# Patient Record
Sex: Female | Born: 1945 | Race: White | Hispanic: No | Marital: Married | Smoking: Former smoker
Health system: Southern US, Community
[De-identification: ages and names within clinical notes are randomized; demographics above are authoritative.]

## PROBLEM LIST (undated history)

## (undated) DIAGNOSIS — E785 Hyperlipidemia, unspecified: Secondary | ICD-10-CM

## (undated) DIAGNOSIS — R413 Other amnesia: Secondary | ICD-10-CM

## (undated) DIAGNOSIS — I10 Essential (primary) hypertension: Secondary | ICD-10-CM

## (undated) DIAGNOSIS — C50919 Malignant neoplasm of unspecified site of unspecified female breast: Secondary | ICD-10-CM

## (undated) DIAGNOSIS — G479 Sleep disorder, unspecified: Secondary | ICD-10-CM

## (undated) DIAGNOSIS — I5032 Chronic diastolic (congestive) heart failure: Secondary | ICD-10-CM

## (undated) HISTORY — DX: Essential (primary) hypertension: I10

## (undated) HISTORY — DX: Other amnesia: R41.3

## (undated) HISTORY — DX: Malignant neoplasm of unspecified site of unspecified female breast: C50.919

---

## 1980-06-06 HISTORY — PX: HIP SURGERY: SHX245

## 1999-12-23 ENCOUNTER — Other Ambulatory Visit: Admission: RE | Admit: 1999-12-23 | Discharge: 1999-12-23 | Payer: Self-pay | Admitting: Obstetrics and Gynecology

## 2001-02-14 ENCOUNTER — Other Ambulatory Visit: Admission: RE | Admit: 2001-02-14 | Discharge: 2001-02-14 | Payer: Self-pay | Admitting: Obstetrics and Gynecology

## 2001-09-13 ENCOUNTER — Other Ambulatory Visit: Admission: RE | Admit: 2001-09-13 | Discharge: 2001-09-13 | Payer: Self-pay | Admitting: Obstetrics and Gynecology

## 2002-03-12 ENCOUNTER — Other Ambulatory Visit: Admission: RE | Admit: 2002-03-12 | Discharge: 2002-03-12 | Payer: Self-pay | Admitting: Obstetrics and Gynecology

## 2002-11-12 ENCOUNTER — Other Ambulatory Visit: Admission: RE | Admit: 2002-11-12 | Discharge: 2002-11-12 | Payer: Self-pay | Admitting: Obstetrics and Gynecology

## 2003-03-14 ENCOUNTER — Other Ambulatory Visit: Admission: RE | Admit: 2003-03-14 | Discharge: 2003-03-14 | Payer: Self-pay | Admitting: Obstetrics and Gynecology

## 2004-07-19 ENCOUNTER — Other Ambulatory Visit: Admission: RE | Admit: 2004-07-19 | Discharge: 2004-07-19 | Payer: Self-pay | Admitting: Obstetrics and Gynecology

## 2006-01-09 ENCOUNTER — Other Ambulatory Visit: Admission: RE | Admit: 2006-01-09 | Discharge: 2006-01-09 | Payer: Self-pay | Admitting: Obstetrics and Gynecology

## 2011-04-07 DIAGNOSIS — C50919 Malignant neoplasm of unspecified site of unspecified female breast: Secondary | ICD-10-CM

## 2011-04-07 HISTORY — DX: Malignant neoplasm of unspecified site of unspecified female breast: C50.919

## 2011-05-03 ENCOUNTER — Other Ambulatory Visit: Payer: Self-pay

## 2011-05-04 ENCOUNTER — Other Ambulatory Visit: Payer: Self-pay | Admitting: Radiology

## 2011-05-04 DIAGNOSIS — C50912 Malignant neoplasm of unspecified site of left female breast: Secondary | ICD-10-CM

## 2011-05-09 ENCOUNTER — Telehealth: Payer: Self-pay | Admitting: *Deleted

## 2011-05-09 NOTE — Telephone Encounter (Signed)
Spoke with pt concerning [facility] for 05/11/11.  Pt relay she confirmed appt with Baltazar Apo.  Pt denies questions or concerns at this time.  Encourage pt to call with needs.  Contact information given.

## 2011-05-10 ENCOUNTER — Ambulatory Visit
Admission: RE | Admit: 2011-05-10 | Discharge: 2011-05-10 | Disposition: A | Discharge status: Home / Self Care | Payer: 59 | Source: Ambulatory Visit | Attending: Radiology | Admitting: Radiology

## 2011-05-10 DIAGNOSIS — C50912 Malignant neoplasm of unspecified site of left female breast: Secondary | ICD-10-CM

## 2011-05-10 MED ORDER — GADOBENATE DIMEGLUMINE 529 MG/ML IV SOLN
20.0000 mL | Freq: Once | INTRAVENOUS | Status: AC | PRN
Start: 2011-05-10 — End: 2011-05-10
  Administered 2011-05-10: 20 mL via INTRAVENOUS

## 2011-05-11 ENCOUNTER — Ambulatory Visit (HOSPITAL_BASED_OUTPATIENT_CLINIC_OR_DEPARTMENT_OTHER): Payer: 59 | Admitting: Oncology

## 2011-05-11 ENCOUNTER — Other Ambulatory Visit: Payer: 59 | Admitting: Lab

## 2011-05-11 ENCOUNTER — Encounter: Payer: Self-pay | Admitting: Oncology

## 2011-05-11 ENCOUNTER — Ambulatory Visit: Payer: 59 | Admitting: Physical Therapy

## 2011-05-11 ENCOUNTER — Ambulatory Visit (HOSPITAL_BASED_OUTPATIENT_CLINIC_OR_DEPARTMENT_OTHER): Payer: 59 | Admitting: Surgery

## 2011-05-11 ENCOUNTER — Ambulatory Visit: Payer: 59

## 2011-05-11 ENCOUNTER — Ambulatory Visit
Admission: RE | Admit: 2011-05-11 | Discharge: 2011-05-11 | Disposition: A | Discharge status: Home / Self Care | Payer: 59 | Source: Ambulatory Visit | Attending: Radiation Oncology | Admitting: Radiation Oncology

## 2011-05-11 ENCOUNTER — Other Ambulatory Visit (INDEPENDENT_AMBULATORY_CARE_PROVIDER_SITE_OTHER): Payer: Self-pay | Admitting: Oncology

## 2011-05-11 ENCOUNTER — Telehealth: Payer: Self-pay | Admitting: *Deleted

## 2011-05-11 VITALS — BP 143/86 | HR 86 | Temp 98.3°F | Ht 65.0 in | Wt 236.0 lb

## 2011-05-11 VITALS — BP 143/86 | HR 86 | Temp 98.3°F | Ht 65.5 in | Wt 236.0 lb

## 2011-05-11 DIAGNOSIS — Z170 Estrogen receptor positive status [ER+]: Secondary | ICD-10-CM

## 2011-05-11 DIAGNOSIS — D0510 Intraductal carcinoma in situ of unspecified breast: Secondary | ICD-10-CM

## 2011-05-11 DIAGNOSIS — Z809 Family history of malignant neoplasm, unspecified: Secondary | ICD-10-CM | POA: Insufficient documentation

## 2011-05-11 DIAGNOSIS — D0512 Intraductal carcinoma in situ of left breast: Secondary | ICD-10-CM | POA: Insufficient documentation

## 2011-05-11 DIAGNOSIS — Z510 Encounter for antineoplastic radiation therapy: Secondary | ICD-10-CM | POA: Insufficient documentation

## 2011-05-11 DIAGNOSIS — Z79899 Other long term (current) drug therapy: Secondary | ICD-10-CM | POA: Insufficient documentation

## 2011-05-11 DIAGNOSIS — D0590 Unspecified type of carcinoma in situ of unspecified breast: Secondary | ICD-10-CM

## 2011-05-11 DIAGNOSIS — C50919 Malignant neoplasm of unspecified site of unspecified female breast: Secondary | ICD-10-CM

## 2011-05-11 DIAGNOSIS — I10 Essential (primary) hypertension: Secondary | ICD-10-CM | POA: Insufficient documentation

## 2011-05-11 HISTORY — DX: Essential (primary) hypertension: I10

## 2011-05-11 NOTE — Progress Notes (Addendum)
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 female who is seen today at the [facility] for evaluation of her DCIS of the left breast. She had a screening mammogram on 04/25/2011 at [facility]. On the left there appear to be a new subcentimeter cluster of microcalcifications. Additional views on 04/27/2011 showed pleomorphic calcifications in the upper outer quadrant of the left breast. Biopsy on 05/03/2011 was diagnostic for high-grade DCIS with comedonecrosis. The DCIS was strongly ER/PR positive. Breast MRI on 05/10/2011 showed an area of masslike enhancement measuring 1.1 x 1.0 x 0.9 cm within the upper-outer quadrant of the left breast, anteriorly. She is without complaints today to her    PREVIOUS RADIATION THERAPY: No   PAST MEDICAL HISTORY:  has no past medical history on file.     PAST SURGICAL HISTORY: Past Surgical History  Procedure Date  . Hip surgery 1982     FAMILY HISTORY: family history includes Cancer in her mother.   SOCIAL HISTORY:  reports that she quit smoking about 18 months ago. She does not have any smokeless tobacco history on file. She reports that she does not drink alcohol.   ALLERGIES: Review of patient's allergies indicates no known allergies.   MEDICATIONS:  Current Outpatient Prescriptions  Medication Sig Dispense Refill  . acetaminophen (TYLENOL) 500 MG tablet Take 500 mg by mouth every 6 (six) hours as needed.        . cholecalciferol (VITAMIN D) 1000 UNITS tablet Take 1,000 Units by mouth daily.        . diflorasone-emollient (APEXICON E) 0.05 % CREA Apply topically 2 (two) times  daily.        Marland Kitchen losartan (COZAAR) 100 MG tablet Take 100 mg by mouth daily.            REVIEW OF SYSTEMS:  Pertinent items are noted in HPI.    PHYSICAL EXAM: Alert and oriented. Vital signs blood pressure 143/86 pulse 86 and regular respiratory rate 20 temperature afebrile. Head and neck examination: Grossly unremarkable. Nodes: Without palpable cervical, supraclavicular, or axillary lymphadenopathy. Chest: Lungs clear. Back: Without spinal or CVA discomfort. Heart: Regular rate and rhythm. Breasts: There is a punctate biopsy wound at approximately 2:00. No masses are appreciated. Right breast without masses or lesions. Abdomen without hepatomegaly. Extremities without edema. Neurologic examination grossly nonfocal.  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 I explained to the patient and her husband the natural history of DCIS. Her local management options include mastectomy or partial cystectomy followed by radiation therapy. She desires breast preservation. We discussed the potential acute and late toxicities of radiation therapy. She may be a candidate for the B. 43 study depending on her HER-2/neu status. We'll also discussed repeating her left breast mammogram following conservative surgery to confirm removal of all suspicious microcalcifications. Lastly, we also discussed  deep inspiration and breath-hold tangent radiation therapy for her left-sided breast cancer to avoid cardiac irradiation. Her prognosis is excellent.   PLAN: A followup visit will be scheduled with me once we have her date of surgery.  I spent 40 minutes minutes face to face with the patient and more than 50% of that time was spent in counseling and/or coordination of care.

## 2011-05-11 NOTE — Telephone Encounter (Signed)
gave patient appointment for 06-20-2011 printed out calendar and gave to the patient

## 2011-05-11 NOTE — Progress Notes (Signed)
Encounter addended by: Maryln Gottron, MD on: 05/11/2011 11:40 AM<BR>     Documentation filed: Notes Section

## 2011-05-11 NOTE — Progress Notes (Addendum)
Re:   Cydnee Fuquay DOB:   1945/11/06 MRN:   [id number]  [facility]  ASSESSMENT AND PLAN: 1.  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 Welton Flakes and [facility] treating oncologist.  I discussed the options for breast cancer treatment with the patient.  The patient is in the [facility] and understands the idea of a multidisciplinary approach to the treatment of breast cancer, which often includes medical oncology and radiation oncology.  I discussed the options of lumpectomy vs. mastectomy.  I discussed the options of lymph node biopsy.  The treatment plan depends on the pathologic staging of the tumor and the patient's personal wishes.  The risks of surgery include, but are not limited to, bleeding, infection, the need for further surgery, and nerve injury.  The patient is a good candidate for lumpectomy.  Because of the small size of her tumor, she does not need a SLNBx.  She will need a needle loc.  This will be followed by radiation tx and probable anti-estrogen tx.  Possible B43 candidate.  [Shylie Wisser 04-11-2046 had genetics on 12/7 and surgery on 12/10. She is scheduled for a f/u with Dr. Dayton Scrape on 1/3 and Dr. Welton Flakes on 1/14.]  2.  Morbid obesity. BMI - 39.3. 3.  Hypertension.  REFERRING PHYSICIAN:  Shary Decamp, MD  HISTORY OF PRESENT ILLNESS: Maria Bolton is a 65 y.o. (DOB: 07-Apr-1946)  white female whose primary care physician is Shary Decamp, MD  and comes to me today for left breast cancer.  She felt nothing in her breat.  She just went for her annual mammogram.  No history of hormonal use.  LMP about 2002.  Her mother had breast cancer at age 38.  MRI on 05/10/2011 - 1.1 x 1.0 cm mass in UOQ left breast.   No past medical history on file.    Past Surgical History  Procedure Date  . Hip surgery 1982      Current Outpatient Prescriptions  Medication Sig Dispense Refill  . acetaminophen (TYLENOL) 500 MG tablet Take 500 mg by mouth every 6 (six) hours as  needed.        . cholecalciferol (VITAMIN D) 1000 UNITS tablet Take 1,000 Units by mouth daily.        . diflorasone-emollient (APEXICON E) 0.05 % CREA Apply topically 2 (two) times daily.        Marland Kitchen losartan (COZAAR) 100 MG tablet Take 100 mg by mouth daily.           No Known Allergies  REVIEW OF SYSTEMS: Skin:  No history of rash.  No history of abnormal moles. Infection:  No history of hepatitis or HIV.  No history of MRSA. Neurologic:  No history of stroke.  No history of seizure.  No history of headaches. Cardiac:  Hypertension x 10 years. No history of heart disease. Pulmonary:  Does not smoke cigarettes.  No asthma or bronchitis.  No OSA/CPAP.  Endocrine:  No diabetes. No thyroid disease. Gastrointestinal:  No history of stomach disease.  No history of liver disease.  No history of gall bladder disease.  No history of pancreas disease.  No history of colon disease. Urologic:  No history of kidney stones.  No history of bladder infections. Musculoskeletal:  Had hip surgery about 1983 for inflammation. GYN:  Sees Dr. Gae Gallop, but has not seen her in 2 or 3 years. Hematologic:  No bleeding disorder.  No history  of anemia.  Not anticoagulated. Psycho-social:  The patient is oriented.   The patient has no obvious psychologic or social impairment to understanding our conversation and plan.  SOCIAL and FAMILY HISTORY: Accompanied with husband. She has one son, 36, who lives in [place].  PHYSICAL EXAM: BP 143/86  Pulse 86  Temp 98.3 F (36.8 C)  Ht 5\' 5"  (1.651 m)  Wt 236 lb (107.049 kg)  BMI 39.27 kg/m2  General: Obese WN WF, who is alert and generally healthy appearing.  HEENT: Normal. Pupils equal. Good dentition. Neck: Supple. No mass.  No thyroid mass.  Carotid pulse okay with no bruit. Lymph Nodes:  No supraclavicular, cervical nodes, or axillary nodes. Lungs: Clear to auscultation and symmetric breath sounds. Heart:  RRR. No murmur or rub. Breasts:  Biopsy site at  3 o'clock left breast.  No palpable mass on left. She has a mole at 9 o'clock on left breast that she wants removed.   Right breast is unremarkable.  Abdomen: Soft. No mass. No tenderness. No hernia. Normal bowel sounds.  No abdominal scars. Rectal: Not done. Extremities:  Good strength and ROM  in upper and lower extremities. Neurologic:  Grossly intact to motor and sensory function. Psychiatric: Has normal mood and affect. Behavior is normal.   DATA REVIEWED: Mammogram, MRI, and path.  Path report given to patient.  Ovidio Kin, MD,  [facility] [address]  [address]   [place], [place]    [address] Phone:  [phone] FAX:  [phone]

## 2011-05-11 NOTE — Progress Notes (Signed)
Maria Bolton [id number] [birth year] 65 y.o. 05/11/2011 1:03 PM  CC:  Dr. Thea Silversmith at [facility]  Dr. Londell Moh at [facility] dermatology  Dr. Ovidio Kin at [facility] surgery  Dr. Chipper Herb radiation oncology REASON FOR CONSULTATION:  65 year old female with new diagnosis of ductal carcinoma in situ of the left breast diagnosed on 05/03/2011 after he needle core biopsy. Patient was seen in the Multidisciplinary Breast Clinic for discussion of her treatment options. She was seen by Dr. Drue Second, [facility]  STAGE:  Tis Nx Mx   REFERRING PHYSICIAN: Dr. Alanson Aly  HISTORY OF PRESENT ILLNESS:  Maria Bolton is a 65 y.o. female.  Who is seen in the multidisciplinary breast clinic today for diagnosis of high-grade ductal carcinoma in situ with comedonecrosis and calcifications. Patient began having screening mammogram on your yearly basis at the age of 83. This year she presented for a annual screening mammograms that showed calcification abnormalities in the left breast. She then went on to have an ultrasound performed that confirmed the abnormality. Patient on 05/03/2011 had a biopsy performed. The biopsy revealed high-grade ductal carcinoma in situ with comedonecrosis and calcification. The tumor was estrogen receptor +99% progesterone receptor +100%. She is seen today in the multidisciplinary breast clinic for discussion of her treatment options. She is without any complaints.   Past Medical History: Past Medical History  Diagnosis Date  . Hypertension 05/11/2011   Past Surgical History: Past Surgical History  Procedure Date  . Hip surgery 1982    Family History: Patient's mother had breast cancer at the age of 54. Patient had 2 paternal aunts one with breast cancer who died at a young age. The other was a breast cancer survivor who is now 63 years old. Patient also has a maternal cousin who had breast cancer  between the age of 37 and 52. Family History  Problem Relation Age of Onset  . Cancer Mother     Social History History  Substance Use Topics  . Smoking status: Former Smoker -- 1.0 packs/day    Quit date: 11/04/2009  . Smokeless tobacco: Not on file  . Alcohol Use: No    Allergies: No Known Allergies  Current Medications: Current Outpatient Prescriptions  Medication Sig Dispense Refill  . acetaminophen (TYLENOL) 500 MG tablet Take 500 mg by mouth every 6 (six) hours as needed.        . cholecalciferol (VITAMIN D) 1000 UNITS tablet Take 1,000 Units by mouth daily.        . diflorasone-emollient (APEXICON E) 0.05 % CREA Apply topically 2 (two) times daily.        Marland Kitchen losartan (COZAAR) 100 MG tablet Take 100 mg by mouth daily.          OB/GYN History: Patient began having menstrual periods at the age of 51. She underwent menopause about 10 years ago when she was 65 years old. She has never been on hormone replacement therapy. She had 1 term pregnancy at the age of 69.  Fertility Discussion: Patient is postmenopausal Prior History of Cancer: No prior history of cancers.  Health Maintenance:  Colonoscopy patient's last colonoscopy was 4 years ago Bone Density she had her last bone density about 2 years ago Last PAP smear last Pap smear was 2 years ago  ECOG PERFORMANCE STATUS: 1 - Symptomatic but completely ambulatory  Genetic Counseling/testing: Due to patient's family history we did discuss genetic counseling and testing. I explained the counseling part of  the patient and the rationale to the patient. She is agreeable to be seen by one of our genetic counselors for further discussion and pedigree generation. If it is deemed that patient is at high risk then she will undergo genetic testing.  REVIEW OF SYSTEMS:  Constitutional: negative Eyes: negative Ears, nose, mouth, throat, and face: negative Respiratory: negative Cardiovascular: negative Gastrointestinal:  negative Integument/breast: positive for breast tenderness Hematologic/lymphatic: negative Musculoskeletal:negative Neurological: negative  PHYSICAL EXAMINATION: Blood pressure 143/86, pulse 86, temperature 98.3 F (36.8 C), temperature source Oral, height 5' 5.5" (1.664 m), weight 236 lb (107.049 kg).  ZOX:WRUEA, healthy, no distress, well nourished, well developed and anxious SKIN: skin color, texture, turgor are normal HEAD: Normocephalic, No masses, lesions, tenderness or abnormalities EYES: normal, PERRLA, EOMI, Conjunctiva are pink and non-injected, sclera clear EARS: External ears normal OROPHARYNX:no exudate, no erythema, lips, buccal mucosa, and tongue normal and dentition normal  NECK: supple, no adenopathy, no bruits, no JVD, thyroid normal size, non-tender, without nodularity LYMPH:  no palpable lymphadenopathy BREAST:breasts appear normal, no suspicious masses, no skin or nipple changes or axillary nodes, right breast normal without mass, skin or nipple changes or axillary nodes, left breast does reveal site of the needle core biopsy with some area of ecchymosis otherwise no masses or nipple discharge. LUNGS: clear to auscultation , and palpation, clear to auscultation and percussion HEART: regular rate & rhythm, no murmurs, no gallops, S1 normal and S2 normal ABDOMEN:abdomen soft, non-tender, obese, normal bowel sounds and no masses or organomegaly BACK: Back symmetric, no curvature., No CVA tenderness, Range of motion is normal EXTREMITIES:no joint deformities, effusion, or inflammation, no edema, no clubbing, no cyanosis  NEURO: alert & oriented x 3 with fluent speech, no focal motor/sensory deficits, gait normal, reflexes normal and symmetric  BREAST EXAM: Bilateral breasts are examined left breast reveals area of ecchymosis from her recent biopsy. right breast no masses or nipple discharge or retractions.   STUDIES/RESULTS: Mr Breast Bilateral W Wo  Contrast  05/10/2011  *RADIOLOGY REPORT*  Clinical Data: Recently diagnosed right breast high grade ductal carcinoma in situ.  BUN and creatinine were obtained on site at [facility] at [address] Results:  BUN 11 mg/dL,  Creatinine 0.8 mg/dL.  BILATERAL BREAST MRI WITH AND WITHOUT CONTRAST  Technique: Multiplanar, multisequence MR images of both breasts were obtained prior to and following the intravenous administration of 20ml of MultiHance.  Three dimensional images were evaluated at the independent DynaCad workstation.  Comparison:  Recent mammogram and biopsy examinations at [facility].  Findings: Mild to moderate background parenchymal enhancement in both breasts.  Biopsy marker clip artifact in the anterior aspect of the outer left breast, slightly superiorly.  There is a small surrounding area of mass-like enhancement with predominately persistent plateau kinetics.  This area measures 1.1 x 1.0 x 0.9 cm in maximum dimensions.  No additional masses or areas of enhancement suspicious for malignancy in either breast.  No abnormal appearing lymph nodes.  IMPRESSION:  1.  1.1 x 1.0 x 0.9 cm area of biopsy-proven ductal carcinoma in situ in the upper outer quadrant of the left breast anteriorly. 2.  No evidence of malignancy elsewhere in either breast.  THREE-DIMENSIONAL MR IMAGE RENDERING ON INDEPENDENT WORKSTATION:  Three-dimensional MR images were rendered by post-processing of the original MR data on an independent workstation.  The three- dimensional MR images were interpreted, and findings were reported in the accompanying complete MRI report for this study.  BI-RADS CATEGORY 6:  Known biopsy-proven malignancy - appropriate action should be taken.  Recommendation:  Treatment plan.  Original Report Authenticated By: Darrol Angel, M.D.     LABS:  None available       PATHOLOGY: Core Needle Biopsy:   Surgical Pathology: Size: calcifications 1.1cm    Size:      Lymph  Node: none     Lymph Node:Grade: High grade     Grade:Proliferation Marker Ki-67: n/a   Ki-67: ER receptor: 99%     ER: PR receptor: 100%     PR: Her2Neu: n/a     Her2Neu:  ASSESSMENT    65 year old female with high-grade ductal carcinoma in situ of the left breast diagnosed 05/03/2011 with associated comedo necrosis and calcifications. By MRI the calcifications measuring 1.1 x 1.0 x 0.9 cm. Patient is status post needle biopsy on 05/03/2011 that showed a DCIS high-grade without any evidence of any other disease. Patient is seen in the multidisciplinary breast clinic for discussion of treatment options. She was seen by Dr. Ovidio Kin Dr. Chipper Herb and myself today. Recommendations have been made for a lumpectomy with sentinel node biopsy. Once she has that she will undergo radiation therapy to the left breast. Post radiation she will be considered for adjuvant antiestrogen therapy consisting of either tamoxifen or an aromatase inhibitor such as Aromasin.  Clinical Trial Eligibility: Week 2 day discussed NSABP B. 43 clinical trial which she would be eligible for. Multidisciplinary conference discussion patient's case was also discussed at the multidisciplinary breast conference this morning. Recommendations were made for a lumpectomy followed by radiation therapy and anti estrogen therapy.    PLAN:    #1 lumpectomy with sentinel node biopsy to be performed by Dr. Ezzard Standing. Dr. Allene Pyo office will contact the patient regarding scheduling of the surgery.  #2 radiation therapy post lumpectomy  #3 patient is eligible for NSABP E. 43 clinical trial and this was discussed with her. Once patient has her final pathology she will meet with our research nurses regarding this trial.  #4 patient will receive antiestrogen therapy consisting of either tamoxifen or an aromatase inhibitor such as Aromasin.       Discussion: Patient is being treated per NCCN breast cancer care guidelines appropriate  for stage. Patient's clinical stage is 0.   Thank you so much for allowing me to participate in the care of [facility]. I will continue to follow up the patient with you and assist in her care.  All questions were answered. The patient knows to call the clinic with any problems, questions or concerns. We can certainly see the patient much sooner if necessary.  I spent 40 minutes counseling the patient face to face. The total time spent in the appointment was 60 minutes.  Drue Second, MD Medical/Oncology [facility] [phone] (beeper) [phone] (Office)  05/11/2011, 1:03 PM 05/11/2011, 1:03 PM

## 2011-05-12 ENCOUNTER — Encounter (HOSPITAL_BASED_OUTPATIENT_CLINIC_OR_DEPARTMENT_OTHER): Payer: Self-pay | Admitting: *Deleted

## 2011-05-12 ENCOUNTER — Other Ambulatory Visit (INDEPENDENT_AMBULATORY_CARE_PROVIDER_SITE_OTHER): Payer: Self-pay | Admitting: Surgery

## 2011-05-12 NOTE — Progress Notes (Signed)
To come in for preop tests-waiting on [facility] orders

## 2011-05-13 ENCOUNTER — Other Ambulatory Visit: Payer: Self-pay

## 2011-05-13 ENCOUNTER — Ambulatory Visit: Payer: 59

## 2011-05-13 ENCOUNTER — Encounter (HOSPITAL_BASED_OUTPATIENT_CLINIC_OR_DEPARTMENT_OTHER)
Admission: RE | Admit: 2011-05-13 | Discharge: 2011-05-13 | Disposition: A | Discharge status: Home / Self Care | Payer: 59 | Source: Ambulatory Visit | Attending: Surgery | Admitting: Surgery

## 2011-05-13 LAB — BASIC METABOLIC PANEL
BUN: 15 mg/dL (ref 6–23)
CO2: 26 mEq/L (ref 19–32)
Chloride: 104 mEq/L (ref 96–112)
GFR calc Af Amer: >90 mL/min (ref >90)
Potassium: 4.5 mEq/L (ref 3.5–5.1)

## 2011-05-13 NOTE — Progress Notes (Signed)
Pt. Seen for genetic counseling.  Blood drawn for BRCA 1/2 

## 2011-05-16 ENCOUNTER — Encounter (HOSPITAL_BASED_OUTPATIENT_CLINIC_OR_DEPARTMENT_OTHER)
Admission: RE | Disposition: A | Discharge status: Home / Self Care | Payer: Self-pay | Source: Ambulatory Visit | Attending: Surgery

## 2011-05-16 ENCOUNTER — Encounter (HOSPITAL_BASED_OUTPATIENT_CLINIC_OR_DEPARTMENT_OTHER): Payer: Self-pay | Admitting: Anesthesiology

## 2011-05-16 ENCOUNTER — Other Ambulatory Visit (INDEPENDENT_AMBULATORY_CARE_PROVIDER_SITE_OTHER): Payer: Self-pay | Admitting: Surgery

## 2011-05-16 ENCOUNTER — Ambulatory Visit (HOSPITAL_BASED_OUTPATIENT_CLINIC_OR_DEPARTMENT_OTHER)
Admission: RE | Admit: 2011-05-16 | Discharge: 2011-05-16 | Disposition: A | Discharge status: Home / Self Care | Payer: 59 | Source: Ambulatory Visit | Attending: Surgery | Admitting: Surgery

## 2011-05-16 ENCOUNTER — Encounter (HOSPITAL_BASED_OUTPATIENT_CLINIC_OR_DEPARTMENT_OTHER): Payer: Self-pay | Admitting: *Deleted

## 2011-05-16 DIAGNOSIS — C50919 Malignant neoplasm of unspecified site of unspecified female breast: Secondary | ICD-10-CM

## 2011-05-16 DIAGNOSIS — Z01812 Encounter for preprocedural laboratory examination: Secondary | ICD-10-CM | POA: Insufficient documentation

## 2011-05-16 DIAGNOSIS — I10 Essential (primary) hypertension: Secondary | ICD-10-CM | POA: Insufficient documentation

## 2011-05-16 DIAGNOSIS — Z01810 Encounter for preprocedural cardiovascular examination: Secondary | ICD-10-CM | POA: Insufficient documentation

## 2011-05-16 DIAGNOSIS — L821 Other seborrheic keratosis: Secondary | ICD-10-CM

## 2011-05-16 DIAGNOSIS — D0590 Unspecified type of carcinoma in situ of unspecified breast: Secondary | ICD-10-CM | POA: Insufficient documentation

## 2011-05-16 DIAGNOSIS — D0510 Intraductal carcinoma in situ of unspecified breast: Secondary | ICD-10-CM

## 2011-05-16 DIAGNOSIS — Z87891 Personal history of nicotine dependence: Secondary | ICD-10-CM | POA: Insufficient documentation

## 2011-05-16 HISTORY — PX: BREAST LUMPECTOMY: SHX2

## 2011-05-16 HISTORY — PX: NEVUS EXCISION: SHX5263

## 2011-05-16 SURGERY — BREAST LUMPECTOMY
Anesthesia: General | Laterality: Left

## 2011-05-16 MED ORDER — DEXAMETHASONE SODIUM PHOSPHATE 4 MG/ML IJ SOLN
INTRAMUSCULAR | Status: DC | PRN
  Administered 2011-05-16: 10 mg via INTRAVENOUS

## 2011-05-16 MED ORDER — CEFAZOLIN SODIUM-DEXTROSE 2-3 GM-% IV SOLR
2.0000 g | INTRAVENOUS | Status: AC
  Administered 2011-05-16: 2 g via INTRAVENOUS

## 2011-05-16 MED ORDER — LACTATED RINGERS IV SOLN
INTRAVENOUS | Status: DC
  Administered 2011-05-16 (×2): via INTRAVENOUS

## 2011-05-16 MED ORDER — ONDANSETRON HCL 4 MG/2ML IJ SOLN
INTRAMUSCULAR | Status: DC | PRN
  Administered 2011-05-16: 4 mg via INTRAVENOUS

## 2011-05-16 MED ORDER — MIDAZOLAM HCL 5 MG/5ML IJ SOLN
INTRAMUSCULAR | Status: DC | PRN
  Administered 2011-05-16: 2 mg via INTRAVENOUS

## 2011-05-16 MED ORDER — HYDROCODONE-ACETAMINOPHEN 5-325 MG PO TABS: 1.0000 | ORAL_TABLET | Freq: Four times a day (QID) | ORAL | Status: AC | PRN

## 2011-05-16 MED ORDER — FENTANYL CITRATE 0.05 MG/ML IJ SOLN
INTRAMUSCULAR | Status: DC | PRN
  Administered 2011-05-16: 100 ug via INTRAVENOUS

## 2011-05-16 MED ORDER — EPHEDRINE SULFATE 50 MG/ML IJ SOLN
INTRAMUSCULAR | Status: DC | PRN
  Administered 2011-05-16: 5 mg via INTRAVENOUS
  Administered 2011-05-16: 10 mg via INTRAVENOUS

## 2011-05-16 MED ORDER — PROPOFOL 10 MG/ML IV EMUL
INTRAVENOUS | Status: DC | PRN
  Administered 2011-05-16: 200 mg via INTRAVENOUS

## 2011-05-16 MED ORDER — BUPIVACAINE HCL (PF) 0.25 % IJ SOLN
INTRAMUSCULAR | Status: DC | PRN
  Administered 2011-05-16: 30 mL

## 2011-05-16 SURGICAL SUPPLY — 49 items
ADH SKN CLS APL DERMABOND .7 (GAUZE/BANDAGES/DRESSINGS)
APL SKNCLS STERI-STRIP NONHPOA (GAUZE/BANDAGES/DRESSINGS)
BANDAGE ELASTIC 6 VELCRO ST LF (GAUZE/BANDAGES/DRESSINGS) IMPLANT
BENZOIN TINCTURE PRP APPL 2/3 (GAUZE/BANDAGES/DRESSINGS) IMPLANT
BINDER BREAST LRG (GAUZE/BANDAGES/DRESSINGS) IMPLANT
BINDER BREAST MEDIUM (GAUZE/BANDAGES/DRESSINGS) IMPLANT
BINDER BREAST XLRG (GAUZE/BANDAGES/DRESSINGS) IMPLANT
BINDER BREAST XXLRG (GAUZE/BANDAGES/DRESSINGS) IMPLANT
BLADE SURG 10 STRL SS (BLADE) ×2 IMPLANT
BLADE SURG 15 STRL LF DISP TIS (BLADE) IMPLANT
BLADE SURG 15 STRL SS (BLADE)
CANISTER SUCTION 1200CC (MISCELLANEOUS) ×1 IMPLANT
CHLORAPREP W/TINT 26ML (MISCELLANEOUS) ×2 IMPLANT
CLIP TI WIDE RED SMALL 6 (CLIP) IMPLANT
CLOTH BEACON ORANGE TIMEOUT ST (SAFETY) ×2 IMPLANT
COVER MAYO STAND STRL (DRAPES) ×2 IMPLANT
COVER TABLE BACK 60X90 (DRAPES) ×2 IMPLANT
DECANTER SPIKE VIAL GLASS SM (MISCELLANEOUS) ×2 IMPLANT
DERMABOND ADVANCED (GAUZE/BANDAGES/DRESSINGS)
DERMABOND ADVANCED .7 DNX12 (GAUZE/BANDAGES/DRESSINGS) IMPLANT
DEVICE DUBIN W/COMP PLATE 8390 (MISCELLANEOUS) IMPLANT
DRAPE PED LAPAROTOMY (DRAPES) ×2 IMPLANT
DRAPE UTILITY XL STRL (DRAPES) ×2 IMPLANT
ELECT COATED BLADE 2.86 ST (ELECTRODE) ×2 IMPLANT
ELECT REM PT RETURN 9FT ADLT (ELECTROSURGICAL) ×2
ELECTRODE REM PT RTRN 9FT ADLT (ELECTROSURGICAL) ×1 IMPLANT
GAUZE SPONGE 4X4 12PLY STRL LF (GAUZE/BANDAGES/DRESSINGS) IMPLANT
GAUZE SPONGE 4X4 16PLY XRAY LF (GAUZE/BANDAGES/DRESSINGS) IMPLANT
GLOVE ECLIPSE 6.5 STRL STRAW (GLOVE) ×2 IMPLANT
GLOVE SURG SIGNA 7.5 PF LTX (GLOVE) ×2 IMPLANT
GOWN PREVENTION PLUS XLARGE (GOWN DISPOSABLE) ×2 IMPLANT
KIT MARKER MARGIN INK (KITS) ×1 IMPLANT
NDL HYPO 25X1 1.5 SAFETY (NEEDLE) ×1 IMPLANT
NDL SAFETY ECLIPSE 18X1.5 (NEEDLE) IMPLANT
NEEDLE HYPO 18GX1.5 SHARP (NEEDLE)
NEEDLE HYPO 25X1 1.5 SAFETY (NEEDLE) ×2 IMPLANT
NS IRRIG 1000ML POUR BTL (IV SOLUTION) IMPLANT
PACK BASIN DAY SURGERY FS (CUSTOM PROCEDURE TRAY) ×2 IMPLANT
PENCIL BUTTON HOLSTER BLD 10FT (ELECTRODE) ×2 IMPLANT
SLEEVE SCD COMPRESS KNEE MED (MISCELLANEOUS) ×2 IMPLANT
SPONGE LAP 4X18 X RAY DECT (DISPOSABLE) ×2 IMPLANT
STRIP CLOSURE SKIN 1/4X4 (GAUZE/BANDAGES/DRESSINGS) IMPLANT
SUT VIC AB 5-0 PS2 18 (SUTURE) ×2 IMPLANT
SUT VICRYL 3-0 CR8 SH (SUTURE) ×2 IMPLANT
SYR CONTROL 10ML LL (SYRINGE) ×2 IMPLANT
TOWEL OR NON WOVEN STRL DISP B (DISPOSABLE) ×2 IMPLANT
TUBE CONNECTING 20X1/4 (TUBING) ×1 IMPLANT
WATER STERILE IRR 1000ML POUR (IV SOLUTION) ×1 IMPLANT
YANKAUER SUCT BULB TIP NO VENT (SUCTIONS) ×1 IMPLANT

## 2011-05-16 NOTE — Anesthesia Preprocedure Evaluation (Addendum)
Anesthesia Evaluation  Patient identified by MRN, date of birth, ID band Patient awake    Reviewed: Allergy & Precautions, H&P , NPO status , Patient's Chart, lab work & pertinent test results  Airway Mallampati: II TM Distance: >3 FB Neck ROM: Full    Dental No notable dental hx. (+) Teeth Intact   Pulmonary former smoker clear to auscultation  Pulmonary exam normal       Cardiovascular hypertension (did not take losartan this am), Pt. on medications neg cardio ROS Regular Normal    Neuro/Psych Negative Neurological ROS     GI/Hepatic negative GI ROS, Neg liver ROS,   Endo/Other  Morbid obesity  Renal/GU negative Renal ROS  Genitourinary negative   Musculoskeletal negative musculoskeletal ROS (+)   Abdominal (+) obese,   Peds  Hematology negative hematology ROS (+)   Anesthesia Other Findings   Reproductive/Obstetrics negative OB ROS                        Anesthesia Physical Anesthesia Plan  ASA: II  Anesthesia Plan: General   Post-op Pain Management:    Induction:   Airway Management Planned: LMA  Additional Equipment:   Intra-op Plan:   Post-operative Plan:   Informed Consent: I have reviewed the patients History and Physical, chart, labs and discussed the procedure including the risks, benefits and alternatives for the proposed anesthesia with the patient or authorized representative who has indicated his/her understanding and acceptance.   Dental advisory given  Plan Discussed with: CRNA and Surgeon  Anesthesia Plan Comments: (Plan routine monitors, GA-LMA OK.)        Anesthesia Quick Evaluation

## 2011-05-16 NOTE — Brief Op Note (Signed)
05/16/2011  12:33 PM  PATIENT:  Maria Bolton, 65 y.o., female, MRN: [id number]  PREOP DIAGNOSIS:  Left breast cancer (DCIS)  POSTOP DIAGNOSIS:   DCIS at 3 o'clock left breast (Tis), skin lesion at 9 o'clock left breast  PROCEDURE:   Procedure(s): LUMPECTOMY NEVUS EXCISION  SURGEON:   Ovidio Kin, M.D.  ASSISTANT:   none  ANESTHESIA:   general  E. Jairo Ben, MD - Anesthesiologist Lance Coon, CRNA - CRNA  General  EBL:  <50   ml  BLOOD ADMINISTERED: none  DRAINS: none   LOCAL MEDICATIONS USED:   30 cc 1/4% marcaine  SPECIMEN:   Left breast skin lesion, left breast lumpectomy, left breast anterior margin  COUNTS CORRECT:  YES  INDICATIONS FOR PROCEDURE:  Maria Bolton is a 65 y.o. (DOB: 1946-01-21) white female whose primary care physician is No primary provider on file. and comes for lumpectomy for left breast DCIS.   The indications and risks of the surgery were explained to the patient.  The risks include, but are not limited to, infection, bleeding, and nerve injury.  Note dictated to:   #[id number]  DN  05/16/2011

## 2011-05-16 NOTE — Interval H&P Note (Signed)
History and Physical Interval Note:  05/16/2011 11:25 AM  Maria Bolton  has presented today for surgery, with the diagnosis of Left breast cancer (DCIS)  The various methods of treatment have been discussed with the patient and family. After consideration of risks, benefits and other options for treatment, the patient has consented to  Procedure(s): Left Breast LUMPECTOMY, Left Breast NEVUS EXCISION as a surgical intervention .    The patients' history has been reviewed, patient examined, no change in status, stable for surgery.  I have reviewed the patients' chart and labs.  Questions were answered to the patient's satisfaction.     Karia Ehresman H

## 2011-05-16 NOTE — Anesthesia Procedure Notes (Signed)
Procedure Name: LMA Insertion Performed by: Lance Coon Pre-anesthesia Checklist: Patient identified, Emergency Drugs available, Suction available, Patient being monitored and Timeout performed Patient Re-evaluated:Patient Re-evaluated prior to inductionOxygen Delivery Method: Circle System Utilized Preoxygenation: Pre-oxygenation with 100% oxygen Intubation Type: IV induction Ventilation: Mask ventilation without difficulty LMA: LMA with gastric port inserted LMA Size: 4.0 Number of attempts: 1 Placement Confirmation: breath sounds checked- equal and bilateral and positive ETCO2 Dental Injury: Teeth and Oropharynx as per pre-operative assessment

## 2011-05-16 NOTE — Transfer of Care (Signed)
Immediate Anesthesia Transfer of Care Note  Patient: Maria Bolton  Procedure(s) Performed:  LUMPECTOMY - Needle localization Left breast lumpectomy; NEVUS EXCISION - remove mole left breast  Patient Location: PACU  Anesthesia Type: General  Level of Consciousness: awake and oriented  Airway & Oxygen Therapy: Patient Spontanous Breathing and Patient connected to face mask oxygen  Post-op Assessment: Report given to PACU RN and Post -op Vital signs reviewed and stable  Post vital signs: Reviewed and stable  Complications: No apparent anesthesia complications

## 2011-05-16 NOTE — H&P (View-Only) (Signed)
Re:   Maria Bolton DOB:   [birth year] MRN:   [id number]  [facility]  ASSESSMENT AND PLAN: 1.  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 Maria Bolton and Maria Bolton treating oncologist.  I discussed the options for breast cancer treatment with the patient.  The patient is in the Maria Bolton and understands the idea of a multidisciplinary approach to the treatment of breast cancer, which often includes medical oncology and radiation oncology.  I discussed the options of lumpectomy vs. mastectomy.  I discussed the options of lymph node biopsy.  The treatment plan depends on the pathologic staging of the tumor and the patient's personal wishes.  The risks of surgery include, but are not limited to, bleeding, infection, the need for further surgery, and nerve injury.  The patient is a good candidate for lumpectomy.  Because of the small size of her tumor, she does not need a SLNBx.  She will need a needle loc.  This will be followed by radiation tx and probable anti-estrogen tx.  Possible B43 candidate.  2.  Morbid obesity. BMI - 39.3. 3.  Hypertension.  REFERRING PHYSICIAN:  Shary Decamp, MD  HISTORY OF PRESENT ILLNESS: Maria Bolton is a 65 y.o. (DOB: 02-04-1946)  white female whose primary care physician is Maria Decamp, MD  and comes to me today for left breast cancer.  She felt nothing in her breat.  She just went for her annual mammogram.  No history of hormonal use.  LMP about 2002.  Her mother had breast cancer at age 44.  MRI on 05/10/2011 - 1.1 x 1.0 cm mass in UOQ left breast.   No past medical history on file.    Past Surgical History  Procedure Date  . Hip surgery 1982      Current Outpatient Prescriptions  Medication Sig Dispense Refill  . acetaminophen (TYLENOL) 500 MG tablet Take 500 mg by mouth every 6 (six) hours as needed.        . cholecalciferol (VITAMIN D) 1000 UNITS tablet Take 1,000 Units by mouth daily.        . diflorasone-emollient (APEXICON E) 0.05  % CREA Apply topically 2 (two) times daily.        Marland Kitchen losartan (COZAAR) 100 MG tablet Take 100 mg by mouth daily.           No Known Allergies  REVIEW OF SYSTEMS: Skin:  No history of rash.  No history of abnormal moles. Infection:  No history of hepatitis or HIV.  No history of MRSA. Neurologic:  No history of stroke.  No history of seizure.  No history of headaches. Cardiac:  Hypertension x 10 years. No history of heart disease. Pulmonary:  Does not smoke cigarettes.  No asthma or bronchitis.  No OSA/CPAP.  Endocrine:  No diabetes. No thyroid disease. Gastrointestinal:  No history of stomach disease.  No history of liver disease.  No history of gall bladder disease.  No history of pancreas disease.  No history of colon disease. Urologic:  No history of kidney stones.  No history of bladder infections. Musculoskeletal:  Had hip surgery about 1983 for inflammation. GYN:  Sees Dr. Gae Bolton, but has not seen her in 2 or 3 years. Hematologic:  No bleeding disorder.  No history of anemia.  Not anticoagulated. Psycho-social:  The patient is oriented.   The patient has no obvious psychologic or social impairment to understanding our conversation and plan.  SOCIAL and FAMILY HISTORY: Accompanied with husband. She has one son, 26, who lives in [place].  PHYSICAL EXAM: BP 143/86  Pulse 86  Temp 98.3 F (36.8 C)  Ht 5\' 5"  (1.651 m)  Wt 236 lb (107.049 kg)  BMI 39.27 kg/m2  General: Obese WN WF, who is alert and generally healthy appearing.  HEENT: Normal. Pupils equal. Good dentition. Neck: Supple. No mass.  No thyroid mass.  Carotid pulse okay with no bruit. Lymph Nodes:  No supraclavicular, cervical nodes, or axillary nodes. Lungs: Clear to auscultation and symmetric breath sounds. Heart:  RRR. No murmur or rub. Breasts:  Biopsy site at 3 o'clock left breast.  No palpable mass on left. She has a mole at 9 o'clock on left breast that she wants removed.   Right breast is  unremarkable.  Abdomen: Soft. No mass. No tenderness. No hernia. Normal bowel sounds.  No abdominal scars. Rectal: Not done. Extremities:  Good strength and ROM  in upper and lower extremities. Neurologic:  Grossly intact to motor and sensory function. Psychiatric: Has normal mood and affect. Behavior is normal.   DATA REVIEWED: Mammogram, MRI, and path.  Path report given to patient.  Maria Kin, MD,  [facility] [address]  [address]   [place], [place]    [address] Phone:  [phone] FAX:  [phone]

## 2011-05-16 NOTE — Anesthesia Postprocedure Evaluation (Signed)
  Anesthesia Post-op Note  Patient: Maria Bolton  Procedure(s) Performed:  LUMPECTOMY - Needle localization Left breast lumpectomy; NEVUS EXCISION - remove mole left breast  Patient Location: PACU  Anesthesia Type: General  Level of Consciousness: awake, alert  and oriented  Airway and Oxygen Therapy: Patient Spontanous Breathing  Post-op Pain: mild  Post-op Assessment: Post-op Vital signs reviewed, Patient's Cardiovascular Status Stable, Respiratory Function Stable, Patent Airway, No signs of Nausea or vomiting and Pain level controlled  Post-op Vital Signs: Reviewed and stable  Complications: No apparent anesthesia complications

## 2011-05-17 NOTE — Op Note (Signed)
NAME:  Maria Bolton, Maria Bolton                      ACCOUNT NO.:  MEDICAL RECORD NO.:  [id number]  LOCATION:                                 FACILITY:  PHYSICIAN:  Sandria Bales. Ezzard Standing, M.D.  DATE OF BIRTH:  10-30-1945  DATE OF PROCEDURE:  05/16/2011                              OPERATIVE REPORT  PREOPERATIVE DIAGNOSES: 1. Ductal carcinoma in situ, left breast, at 3 o'clock position. 2. Mole, left breast, at 9 o'clock position.  POSTOPERATIVE DIAGNOSES: 1. Ductal carcinoma in situ, left breast, 3 o'clock position. 2. Mole in skin (1 cm), left breast, 9 o'clock position.  PROCEDURES: 1. Needle local left breast lumpectomy. (also, excision of anterior margin) 2. Excision of mole, left breast.  SURGEON:  Sandria Bales. Ezzard Standing, MD  FIRST ASSISTANT:  None.  ANESTHESIA:  General LMA, supervised by Germaine Pomfret, MD  ESTIMATED BLOOD LOSS:  Minimal.  COMPLICATIONS:  None.  INDICATION FOR PROCEDURE:  [name] is a 65 year old white female who sees Dr. Thayer Headings as her primary medical doctor.  She had a recent mammogram that showed an abnormality in her left breast about the 3 o'clock position.  She underwent a biopsy which showed ductal carcinoma in situ.  An MRI suggested this area is 1.1 cm.  She presented to the [facility]isciplinary [facility], was seen by myself, Dr. Drue Second, Dr. Ballard Russell.  She appeared to be a good candidate for breast conservation.  She now comes for attempted left breast lumpectomy.  She also has a mole on her left breast.  We will excise that.  I discussed with her the indications, potential complications of surgery.  Potential complications include, but are not limited to, bleeding, infection, need for further surgery, and nerve injury.  OPERATIVE NOTE:  The patient had a wire placed in her left breast at [facility] with Dr. Rogelia Mire.  She then presented to the [facility] and underwent a general LMA anesthesia, supervised by Dr. Jairo Ben,  in room #4.    A time-out was held and surgical checklist run.  The left breast was prepped with ChloraPrep, sterilely draped.  I first removed about a 1-cm mole on the medial aspect that looked like a seborrheic keratosis.  This was sent to Pathology.  I then turned my attention to a wire, coming out of her left breast at about the 3 o'clock position, pointing cranial and medial.  I made about a 4-cm incision, excised a block of breast tissue around this which was about 4-5 cm in diameter, about 5-6 cm in length.  The specimen was painted with a 6-paint orientation kit.  A specimen mammogram confirmed that the wire and the clip were in good position. The clip was much closer to the anterior margin than I imagined, than what I saw on the post-needle looked mammograms.  I spoke with Dr. Rogelia Mire, we kind of went over what the findings were.  I decided to excise her anterior layer superficial including skin and subcutaneous fat as an additional specimen and this was labeled as such.  It was painted with a single color anteriorly, marking the fat anteriorly which  was subcuticular.  I then irrigated the wound with saline, I infiltrated with 30 mL of 0.25% Marcaine.  I closed the wound in layers using 3-0 Vicryl sutures and about 3 layers deep in the skin with a 5-0 Vicryl layer, painted with Dermabond and sterilely dressed.  The patient tolerated the procedure well and was transported to recovery room in good condition. Sponge and needle count were correct at the end of the case.  Final pathology is pending at the time of this dictation.   Sandria Bales. Ezzard Standing, M.D. FACS   DHN/MEDQ  D:  05/16/2011  T:  05/16/2011  Job:  [id number]  cc:   Thayer Headings, M.D. Drue Second, M.D. Maryln Gottron, M.D.

## 2011-05-19 ENCOUNTER — Telehealth (INDEPENDENT_AMBULATORY_CARE_PROVIDER_SITE_OTHER): Payer: Self-pay | Admitting: Surgery

## 2011-05-19 ENCOUNTER — Encounter (HOSPITAL_BASED_OUTPATIENT_CLINIC_OR_DEPARTMENT_OTHER): Payer: Self-pay | Admitting: Surgery

## 2011-05-19 NOTE — Telephone Encounter (Signed)
I spoke to patient about path report, which is good.    DN     05/19/2011

## 2011-05-23 ENCOUNTER — Encounter: Payer: Self-pay | Admitting: *Deleted

## 2011-05-23 NOTE — Progress Notes (Signed)
Mailed after appt letter to pt. 

## 2011-05-25 ENCOUNTER — Encounter (INDEPENDENT_AMBULATORY_CARE_PROVIDER_SITE_OTHER): Payer: Self-pay | Admitting: Surgery

## 2011-05-25 ENCOUNTER — Ambulatory Visit (INDEPENDENT_AMBULATORY_CARE_PROVIDER_SITE_OTHER): Payer: 59 | Admitting: Surgery

## 2011-05-25 VITALS — BP 134/78 | HR 80 | Temp 97.8°F | Resp 18 | Ht 65.0 in | Wt 237.8 lb

## 2011-05-25 DIAGNOSIS — D0590 Unspecified type of carcinoma in situ of unspecified breast: Secondary | ICD-10-CM

## 2011-05-25 DIAGNOSIS — D0510 Intraductal carcinoma in situ of unspecified breast: Secondary | ICD-10-CM

## 2011-05-25 NOTE — Progress Notes (Addendum)
PO Visit  Re:   Maria Bolton DOB:   [birth year] MRN:   [id number]  [facility]  ASSESSMENT AND PLAN: 1.  Left breast cancer, UOQ.  Post op left breast lumpectomy - 05/16/2011.  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  (preop size by MRI was considered 1.1 cm, DCIS, Tis)  ER - 99%, PR - 100%  Drs. Welton Flakes and [facility] treating oncologist.   Steward Drone Dornfeld 2045/06/14 had genetics on 12/7 (I have not heard results from genetics.). She is scheduled for a f/u with Dr. Dayton Scrape on 1/3 and Dr. Welton Flakes on 1/14.]  Her incision looks good.  She'll me back in 6 months for next office visit.  2.  Morbid obesity. BMI - 39.3. 3.  Hypertension. 4.  Seborrheic keratosis of skin - 9 o'clock left breast. 5.  Genetic tests for BRCA 1/2 - neg.  05/13/2011.  REFERRING PHYSICIAN:  Shary Decamp, MD  HISTORY OF PRESENT ILLNESS: Maria Bolton is a 65 y.o. (DOB: 1946/05/28)  white female whose primary care physician is Shary Decamp, MD  and comes to me for follow up left breast lumpectomy.    She has some soreness.  But is using her arm well.  Her final path showed a very small focus of DCIS (0.17 cm).  I gave her copy of her path.  Her husband is with her.  Her mother had breast cancer at age 28.    Current Outpatient Prescriptions  Medication Sig Dispense Refill  . acetaminophen (TYLENOL) 500 MG tablet Take 500 mg by mouth every 6 (six) hours as needed.        . cholecalciferol (VITAMIN D) 1000 UNITS tablet Take 1,000 Units by mouth daily.        . diflorasone-emollient (APEXICON E) 0.05 % CREA Apply topically 2 (two) times daily.        Marland Kitchen HYDROcodone-acetaminophen (NORCO) 5-325 MG per tablet Take 1-2 tablets by mouth every 6 (six) hours as needed for pain.  30 tablet  1  . losartan (COZAAR) 100 MG tablet Take 100 mg by mouth daily.           No Known Allergies  REVIEW OF SYSTEMS: Cardiac:  Hypertension x 10 years. No history of heart disease. Musculoskeletal:  Had hip surgery about 1983 for  inflammation. GYN:  Sees Dr. Gae Gallop, but has not seen her in 2 or 3 years.  SOCIAL and FAMILY HISTORY: Accompanied with husband. She has one son, 87, who lives in [place].  PHYSICAL EXAM: BP 134/78  Pulse 80  Temp(Src) 97.8 F (36.6 C) (Temporal)  Resp 18  Ht 5\' 5"  (1.651 m)  Wt 237 lb 12.8 oz (107.865 kg)  BMI 39.57 kg/m2  Breasts:  Biopsy site at 3 o'clock left breast. Looks okay. Skin lesion removed from 9 o'clock looks okay.  DATA REVIEWED: Path report given to patient.  Ovidio Kin, MD,  [facility] [address] [place]  [address]   [place], [place]    [address] Phone:  [phone] FAX:  [phone]

## 2011-06-06 ENCOUNTER — Encounter: Payer: Self-pay | Admitting: Radiation Oncology

## 2011-06-06 NOTE — Progress Notes (Signed)
65 year female. Married with one son, 12, who lives in [place].  NKDA.  No radiation therapy in the past. Pacemaker?  Primary Care Physician: Shary Decamp, MD  04/25/2011 screening mammogram revealed abnormality. 05/03/11 Biopsy diagnostic for high grade DCIS with comedonecrosis. ER - 99%, PR - 100%. No mutation detected with [facility] Analysis.  Patient desired breast preservation. Left breast lumpectomy done 05/16/2011.   Patient seen by Dr. Dayton Scrape in the breast clinic 05/11/2011. Follow up with Dr. Dayton Scrape scheduled on 1/3 and Dr. Welton Flakes 1/14.

## 2011-06-09 ENCOUNTER — Telehealth: Payer: Self-pay | Admitting: *Deleted

## 2011-06-09 ENCOUNTER — Ambulatory Visit
Admission: RE | Admit: 2011-06-09 | Discharge: 2011-06-09 | Disposition: A | Discharge status: Home / Self Care | Payer: 59 | Source: Ambulatory Visit | Attending: Radiation Oncology | Admitting: Radiation Oncology

## 2011-06-09 ENCOUNTER — Encounter: Payer: Self-pay | Admitting: Radiation Oncology

## 2011-06-09 VITALS — BP 146/79 | HR 82 | Temp 97.9°F | Resp 20 | Ht 65.0 in | Wt 239.0 lb

## 2011-06-09 DIAGNOSIS — C50919 Malignant neoplasm of unspecified site of unspecified female breast: Secondary | ICD-10-CM

## 2011-06-09 DIAGNOSIS — D0510 Intraductal carcinoma in situ of unspecified breast: Secondary | ICD-10-CM

## 2011-06-09 HISTORY — DX: Sleep disorder, unspecified: G47.9

## 2011-06-09 NOTE — Progress Notes (Signed)
Encounter addended by: Ardell Isaacs on: 06/09/2011  5:13 PM<BR>     Documentation filed: Charges VN

## 2011-06-09 NOTE — Progress Notes (Signed)
Encounter addended by: Ardell Isaacs on: 06/09/2011  5:15 PM<BR>     Documentation filed: Charges VN

## 2011-06-09 NOTE — Progress Notes (Signed)
Pt denies she has pacemaker.  Difficulty sleeping since dx; advised she try 1 Hydrocodone/APAP at night to help her rest.

## 2011-06-09 NOTE — Progress Notes (Signed)
Please see the Nurse Progress Note in the MD Initial Consult Encounter for this patient. 

## 2011-06-09 NOTE — Progress Notes (Signed)
Followup note:  Diagnosis stage 0, DCIS of the left breast  Ms. Dials returns today for review and scheduling of her left breast radiation therapy. I first saw the patient in consultation on 05/11/2011. She is found to have a new subcentimeter cluster of microcalcifications within the left breast involving the upper outer quadrant. The biopsy was diagnostic for high-grade DCIS with comedonecrosis. The DCIS was strongly ER/PR positive. Breast MRI on 05/10/2011 showed an area of involvement measured 1.1 x 1.0 x 0.9 cm. She underwent a left partial mastectomy by Dr. Ezzard Standing on 05/16/2011. She is found to have a residual focus of intermediate grade DCIS measuring 0.17 cm. The closest margin was 0.8 cm. She is doing well except for left breast pain along her tumor bed. She was being considered for the B. 43 protocol but her tumor was reported to be HER-2/neu negative according to the patient.  Physical examination: Alert and oriented. Nodes without palpable cervical, supraclavicular, or axillary lymphadenopathy. Chest: Lungs clear. Breasts: There is a poor cystectomy wound along the upper-outer quadrant at approximately 2:30 extending to the areolar edge. There is underlying induration which may represent seroma or hematoma. This is tender to palpation. No other masses are appreciated. Right breast without masses or lesions. Extremities without edema.  Impression: High-grade DCIS of the left breast. We again discussed local treatment options which include mastectomy versus partial mastectomy followed by radiation therapy. Since she does have high-grade DCIS, do recommend radiation therapy even though her disease volume is small. I am scheduling of her for a postoperative left breast mammogram at [facility] to rule out residual microcalcifications. This was scheduled for the week of January 14 since she is too uncomfortable at this time. I plan to proceed with radiation therapy simulation/planning the week of January 21.  From a technical standpoint we may offer her deep inspiration and breath-hold tangential treatment to avoid cardiac irradiation. We discussed the potential acute and late toxicities of radiation therapy and she wishes to proceed as outlined. Consent was signed today. Lastly, Dr. Welton Flakes may offer her adjuvant hormone therapy following completion of radiation therapy.  30 minutes was spent face-to-face with the patient, primarily counseling the patient and coordinating her care.

## 2011-06-13 ENCOUNTER — Telehealth: Payer: Self-pay | Admitting: *Deleted

## 2011-06-15 ENCOUNTER — Encounter: Payer: Self-pay | Admitting: *Deleted

## 2011-06-15 ENCOUNTER — Encounter (INDEPENDENT_AMBULATORY_CARE_PROVIDER_SITE_OTHER): Payer: Self-pay | Admitting: Oncology

## 2011-06-15 NOTE — Progress Notes (Signed)
[facility] Distress Screening  Clinical Social Work was referred by distress screening protocol. Patient scored a 7 on the Psychosocial Distress Thermometer which indicates moderate distress. Clinical Social Worker was unable to meet with pt in exam room; therefore contacted pt at home to assess for distress and other psychosocial needs.  Pt stated she was still sore from surgery, but otherwise was doing well.  Pt informed CSW that her mother had passed away from breast cancer years ago; which was initial the cause of her increased distress.  CSW validated pt's feelings and informed her of the support services at [facility].  Pt was very appreciative of information and contact.  Pt stated that her experience at [facility] has been "wonderful".  Pt has an simulation appointment on 06/27/11, and stated "I am ready to start".  CSW applauded pt's determination and positive attitude.  CSW encouraged pt to call with any needs or concerns.    Clinical Social Worker follow up needed: Not at this time.  Tamala Julian, LCSW

## 2011-06-20 ENCOUNTER — Ambulatory Visit (HOSPITAL_BASED_OUTPATIENT_CLINIC_OR_DEPARTMENT_OTHER): Payer: 59 | Admitting: Oncology

## 2011-06-20 ENCOUNTER — Encounter: Payer: Self-pay | Admitting: *Deleted

## 2011-06-20 ENCOUNTER — Other Ambulatory Visit: Payer: 59 | Admitting: Lab

## 2011-06-20 ENCOUNTER — Other Ambulatory Visit: Payer: Self-pay | Admitting: Oncology

## 2011-06-20 ENCOUNTER — Telehealth: Payer: Self-pay | Admitting: *Deleted

## 2011-06-20 VITALS — BP 160/81 | HR 88 | Temp 97.9°F | Ht 65.0 in | Wt 239.6 lb

## 2011-06-20 DIAGNOSIS — I10 Essential (primary) hypertension: Secondary | ICD-10-CM

## 2011-06-20 DIAGNOSIS — D0510 Intraductal carcinoma in situ of unspecified breast: Secondary | ICD-10-CM

## 2011-06-20 DIAGNOSIS — C50919 Malignant neoplasm of unspecified site of unspecified female breast: Secondary | ICD-10-CM

## 2011-06-20 DIAGNOSIS — N61 Mastitis without abscess: Secondary | ICD-10-CM

## 2011-06-20 LAB — CBC WITH DIFFERENTIAL/PLATELET
BASO%: 0.5 % (ref 0.0–2.0)
EOS%: 2.6 % (ref 0.0–7.0)
MCH: 28.8 pg (ref 25.1–34.0)
MCHC: 33.9 g/dL (ref 31.5–36.0)
MCV: 84.9 fL (ref 79.5–101.0)
MONO%: 5.3 % (ref 0.0–14.0)
RBC: 4.67 10*6/uL (ref 3.70–5.45)
RDW: 14.4 % (ref 11.2–14.5)
lymph#: 2.9 10*3/uL (ref 0.9–3.3)

## 2011-06-20 LAB — COMPREHENSIVE METABOLIC PANEL
AST: 14 U/L (ref 0–37)
Albumin: 3.9 g/dL (ref 3.5–5.2)
Alkaline Phosphatase: 78 U/L (ref 39–117)
Calcium: 9.4 mg/dL (ref 8.4–10.5)
Chloride: 105 mEq/L (ref 96–112)
Potassium: 3.9 mEq/L (ref 3.5–5.3)
Sodium: 142 mEq/L (ref 135–145)
Total Protein: 6.6 g/dL (ref 6.0–8.3)

## 2011-06-20 MED ORDER — CEPHALEXIN 500 MG PO CAPS: 500.0000 mg | ORAL_CAPSULE | Freq: Four times a day (QID) | ORAL | Status: AC

## 2011-06-20 NOTE — Progress Notes (Signed)
OFFICE PROGRESS NOTE  CC  Thayer Headings, MD, MD [address], [address] [facility] [place] [address]  Dr. Ovidio Kin Dr. 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 Patient was originally seen in the multidisciplinary breast clinic on 07/11/2010.  PRIOR THERAPY:  #1 patient is status post left breast lumpectomy performed on 05/16/2011. The final pathology revealed a small focus of intermediate grade DCIS measuring 0.17 cm. The DCIS came close to 0.8 cm of the anterior margin. Remaining margins were free of carcinoma. The tumor was estrogen receptor +99% progesterone receptor +100%. Pathologic staging Tis NX.  #2 patient is seen by Dr. Chipper Herb for consideration of radiation therapy. She has a followup appointment with him on January 21.  #3 patient underwent genetic testing and counseling and she is negative for comprehensive analysis of BRCA1 and 2.  #3 patient is also being considered for NSABP B- 43 clinical study and she will meet with one of our  Clinical research nurses  CURRENT THERAPY:patient will receive radiation therapy and possible begin enrollment on NSABP B- 43 trial.  INTERVAL HISTORY: Maria Bolton 66 y.o. female returns for followup visit post lumpectomy. Overall she is doing well she is without any significant complaints. She does have a little bit of tenderness in the left breast. There is noted to be some redness at the surgical site possibly mastitis. She otherwise denies any fevers chills night sweats headaches shortness of breath chest pains palpitations. Patient does tell me that she has an appointment set out for a mammogram to be performed at [facility] tomorrow. She is a little bit concerned about having compression of the breasts performed while it is tender. She has no hematuria hematochezia melena hemoptysis hematemesis no scar the skin changes. Remainder of the 10 point review  of systems is negative.  MEDICAL HISTORY: Past Medical History  Diagnosis Date  . Breast cancer 04/2011    DCIS left breast , ER/PR+  . Hypertension 05/11/2011  . Difficulty sleeping     since dx of cancer    ALLERGIES:   has no known allergies.  MEDICATIONS:  Current Outpatient Prescriptions  Medication Sig Dispense Refill  . acetaminophen (TYLENOL) 500 MG tablet Take 500 mg by mouth every 6 (six) hours as needed.        . cholecalciferol (VITAMIN D) 1000 UNITS tablet Take 1,000 Units by mouth daily.        . diflorasone-emollient (APEXICON E) 0.05 % CREA Apply topically 2 (two) times daily.        Marland Kitchen losartan (COZAAR) 100 MG tablet Take 100 mg by mouth daily.        . cephALEXin (KEFLEX) 500 MG capsule Take 1 capsule (500 mg total) by mouth 4 (four) times daily.  40 capsule  0  . HYDROcodone-acetaminophen (NORCO) 5-325 MG per tablet         SURGICAL HISTORY:  Past Surgical History  Procedure Date  . Hip surgery 1982  . Nevus excision 05/16/2011    Procedure: NEVUS EXCISION;  Surgeon: Kandis Cocking, MD;  Location: [facility];  Service: General;  Laterality: Left;  remove mole left breast  . Breast lumpectomy 05/16/2011    Procedure: LUMPECTOMY;  Surgeon: Kandis Cocking, MD;  Location: [facility];  Service: General;  Laterality: Left;  Needle localization Left breast lumpectomy    REVIEW OF SYSTEMS:  Pertinent items are noted in HPI.   PHYSICAL EXAMINATION:  General appearance: alert, cooperative and appears stated age Head: Normocephalic, without obvious abnormality, atraumatic Neck: no adenopathy, no carotid bruit, no JVD, supple, symmetrical, trachea midline and thyroid not enlarged, symmetric, no tenderness/mass/nodules Lymph nodes: Cervical, supraclavicular, and axillary nodes normal. Resp: clear to auscultation bilaterally and normal percussion bilaterally Back: symmetric, no curvature. ROM normal. No CVA tenderness. Cardio: regular rate  and rhythm, S1, S2 normal, no murmur, click, rub or gallop and normal apical impulse GI: soft, non-tender; bowel sounds normal; no masses,  no organomegaly Extremities: extremities normal, atraumatic, no cyanosis or edema Neurologic: Alert and oriented X 3, normal strength and tone. Normal symmetric reflexes. Normal coordination and gait Bilateral breast examination. Left breast reveals a well-healed lumpectomy scar there is slight redness at the lumpectomy region possibly mastitis. Right breast no masses or nipple discharge. ECOG PERFORMANCE STATUS: 0 - Asymptomatic  Blood pressure 160/81, pulse 88, temperature 97.9 F (36.6 C), temperature source Oral, height 5\' 5"  (1.651 m), weight 239 lb 9.6 oz (108.682 kg).  LABORATORY DATA: Lab Results  Component Value Date   HGB 13.5 05/16/2011      Chemistry      Component Value Date/Time   NA 140 05/13/2011 1000   K 4.5 05/13/2011 1000   CL 104 05/13/2011 1000   CO2 26 05/13/2011 1000   BUN 15 05/13/2011 1000   CREATININE 0.65 05/13/2011 1000      Component Value Date/Time   CALCIUM 9.7 05/13/2011 1000       RADIOGRAPHIC STUDIES:  No results found.  ASSESSMENT: 66 year old female with stage 0 (TIS N0) ductal carcinoma in situ. She is status post lumpectomy on 05/16/2011. The tumor was estrogen receptor +99% progesterone receptor +100%. Postoperatively patient is doing well. She has been seen by Dr. Chipper Herb who is planning on doing radiation therapy. She has developed  mastitis I will treat her with oral antibiotics. She also is a good candidate for NSABP B-43 clinical trial and she will meet with one of our research nurses today   PLAN:   #1 patient is a candidate for NSABP the 43 clinical trial and she will meet with one of the research nurses for enrollment possibly.  #2 mastitis she will go ahead and start Keflex 500 mg 4 times a day for 10 days.  #3 I will plan on seeing the patient back in about 2 months time.   All  questions were answered. The patient knows to call the clinic with any problems, questions or concerns. We can certainly see the patient much sooner if necessary.  I spent 20 minutes counseling the patient face to face. The total time spent in the appointment was 30 minutes.    Drue Second, MD Medical/Oncology [facility] [phone] (beeper) [phone] (Office)  06/20/2011, 12:17 PM

## 2011-06-20 NOTE — Telephone Encounter (Signed)
Pt called stating she saw Dr. Welton Flakes and her Breast is still red/very sore and was given prescription for cephalexin 500 mg 1 po 4 x day,dispense 40 tabs, pt has mammogram scheduled for tomorrow and feels it should be canceled until her Breast clears up , will e-mail Dr. Dayton Scrape and his nurse Percell Boston,  And call her back, her mammogram is scheduled at [facility] ,pt awaiting call back 2:35 PM

## 2011-06-21 ENCOUNTER — Telehealth: Payer: Self-pay | Admitting: *Deleted

## 2011-06-24 ENCOUNTER — Ambulatory Visit (INDEPENDENT_AMBULATORY_CARE_PROVIDER_SITE_OTHER): Payer: 59 | Admitting: Surgery

## 2011-06-24 VITALS — BP 158/90 | HR 96 | Temp 98.0°F | Resp 16 | Ht 65.0 in | Wt 240.0 lb

## 2011-06-24 DIAGNOSIS — N61 Mastitis without abscess: Secondary | ICD-10-CM

## 2011-06-24 NOTE — Progress Notes (Signed)
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  She apparently developed cellulitis in this breast and was treated by Dr. Welton Flakes with Keflex.  She has responded to the antibiotics but noticed some drainage earlier today.  I removed the dermabond and tried to open the area with a 18 gauge needle after prepping with cholorhexidine.  Minimal drainage noted.  Not fluctuant or red.    Rec: Warm showers, baths with antimicrobial soap,;  Continue antibiotics Followup with Dr. Ezzard Standing in 2 weeks.

## 2011-06-27 ENCOUNTER — Ambulatory Visit: Payer: 59 | Admitting: Radiation Oncology

## 2011-06-30 ENCOUNTER — Ambulatory Visit: Admission: RE | Admit: 2011-06-30 | Payer: 59 | Source: Ambulatory Visit | Admitting: Radiation Oncology

## 2011-06-30 ENCOUNTER — Telehealth: Payer: Self-pay | Admitting: Radiation Oncology

## 2011-06-30 ENCOUNTER — Encounter: Payer: Self-pay | Admitting: Radiation Oncology

## 2011-06-30 ENCOUNTER — Ambulatory Visit: Payer: 59

## 2011-06-30 NOTE — Progress Notes (Signed)
Chart note:  The patient visits today for her simulation appointment. She is 6 and one half weeks out from her definitive surgery She postponed her postoperative mammogram at [facility] because of left breast discomfort and drainage 10 days ago. She seen by Dr. Welton Flakes on January 18 and felt to have cellulitis/mastitis. She was placed on Keflex for 10 days. She was seen by Dr. Daphine Deutscher one week ago and he felt that things look improved although she still having significant left breast discomfort. She had a small amount of dark bloody drainage one week ago. She does not feels that she did have a mammogram in the immediate future secondary to breast pain.  Physical examination: On inspection of the left breast there is no obvious cellulitis. There is slight erythema/bruising along the lateral left partial mastectomy wound at 3:00. There is moderate induration deep to the wound as expected. She may have a small hematoma/seroma. The breast is tender to palpation.  Impression: I do not think she has active infection or an abscess. She probably has a small moderate hematoma/seroma resulting in left breast discomfort.  Plan: She'll see Dr. Ezzard Standing tomorrow, and I will talk with her early next week and determine we can repeat her left breast mammogram prior to simulation. She is to call me this coming Monday or Tuesday.

## 2011-06-30 NOTE — Telephone Encounter (Signed)
Met with patient to discuss RO billing. Pt has some fin concerns, but advised she will let me know.  [facility] has been notified of pt's cancer dx. The CSR clinical nurse will be contacting pt for addl info and updates.   Attending Rad: Dr. 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  Rad Tx: [id number] Extrl Beam

## 2011-07-01 ENCOUNTER — Encounter (INDEPENDENT_AMBULATORY_CARE_PROVIDER_SITE_OTHER): Payer: Self-pay | Admitting: Surgery

## 2011-07-01 ENCOUNTER — Ambulatory Visit (INDEPENDENT_AMBULATORY_CARE_PROVIDER_SITE_OTHER): Payer: 59 | Admitting: Surgery

## 2011-07-01 VITALS — BP 156/90 | HR 72 | Temp 98.2°F | Resp 18 | Ht 65.0 in | Wt 239.8 lb

## 2011-07-01 DIAGNOSIS — D0510 Intraductal carcinoma in situ of unspecified breast: Secondary | ICD-10-CM

## 2011-07-01 DIAGNOSIS — D0590 Unspecified type of carcinoma in situ of unspecified breast: Secondary | ICD-10-CM

## 2011-07-01 NOTE — Progress Notes (Signed)
PO Visit  Re:   Maria Bolton DOB:   June 11, 1945 MRN:   [id number]  [facility]  ASSESSMENT AND PLAN: 1.  Left breast cancer, UOQ.  Post op left breast lumpectomy - 05/16/2011.  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  (preop size by MRI was considered 1.1 cm, DCIS, Tis)  ER - 99%, PR - 100%  Drs. Welton Flakes and [facility] treating oncologist.  She has had some pain and questionable infection in her incision.  Dr. Welton Flakes put her on 10 days of Keflex, which she has finished.  She saw Dr. Dayton Scrape yesterday.  Though hard and a little knotty, I think her incision looks okay today.  She is having some pain.  I tried to reassure her.  She is worried about not starting the rad tx "on time", but I reassured her that her cancer is very favorable.  She'll me back in 6 months for next office visit.  2.  Morbid obesity. BMI - 39.3. 3.  Hypertension. 4.  Seborrheic keratosis of skin - 9 o'clock left breast. 5.  Genetic tests for BRCA 1/2 - neg.  05/13/2011.  REFERRING PHYSICIAN:  Shary Decamp, MD  HISTORY OF PRESENT ILLNESS: Maria Bolton is a 65 y.o. (DOB: April 29, 1946)  white female whose primary care physician is Shary Decamp, MD  and comes to me for follow up left breast lumpectomy.   She has had some pain and questionable infection in her incision.  Dr. Welton Flakes put her on 10 days of Keflex, which she has finished.  She saw Dr. Dayton Scrape yesterday.  Though hard and a little knotty, I think her incision looks okay today.  She is having some pain.  I tried to reassure her.  She is worried about not starting the rad tx "on time", but I reassured her that her cancer is very favorable.  She is accompanied with her husband.   Current Outpatient Prescriptions  Medication Sig Dispense Refill  . acetaminophen (TYLENOL) 500 MG tablet Take 500 mg by mouth every 6 (six) hours as needed.        . cephALEXin (KEFLEX) 500 MG capsule Take 1 capsule (500 mg total) by mouth 4 (four) times daily.  40 capsule  0  .  cholecalciferol (VITAMIN D) 1000 UNITS tablet Take 1,000 Units by mouth daily.        . diflorasone-emollient (APEXICON E) 0.05 % CREA Apply topically 2 (two) times daily.        Marland Kitchen HYDROcodone-acetaminophen (NORCO) 5-325 MG per tablet       . losartan (COZAAR) 100 MG tablet Take 100 mg by mouth daily.           No Known Allergies  REVIEW OF SYSTEMS: Cardiac:  Hypertension x 10 years. No history of heart disease. Musculoskeletal:  Had hip surgery about 1983 for inflammation. GYN:  Sees Dr. Gae Gallop, but has not seen her in 2 or 3 years.  SOCIAL and FAMILY HISTORY: Accompanied with husband. She has one son, 39, who lives in [place]. Her mother had breast cancer at age 43.  PHYSICAL EXAM: BP 156/90  Pulse 72  Temp(Src) 98.2 F (36.8 C) (Temporal)  Resp 18  Ht 5\' 5"  (1.651 m)  Wt 239 lb 12.8 oz (108.773 kg)  BMI 39.90 kg/m2  Breasts:  Biopsy site at 3 o'clock left breast. The incision is a little nodular, but I see no evidence of infection or drainage.  DATA REVIEWED: None new.  Ovidio Kin, MD,  FACS  [facility], [place] [address]  [place], [place]    [place] Phone:  [place]:  [phone]

## 2011-07-18 ENCOUNTER — Ambulatory Visit
Admission: RE | Admit: 2011-07-18 | Discharge: 2011-07-18 | Disposition: A | Discharge status: Home / Self Care | Payer: 59 | Source: Ambulatory Visit | Attending: Radiation Oncology | Admitting: Radiation Oncology

## 2011-07-18 DIAGNOSIS — D0510 Intraductal carcinoma in situ of unspecified breast: Secondary | ICD-10-CM

## 2011-07-18 NOTE — Progress Notes (Signed)
Simulation/treatment planning note:  Maria Bolton was taken to the CT simulator. She was placed supine. A custom neck was placed on a breast board for immobilization. Her left partial mastectomy scar was marked with a radiopaque wire. Her field borders were also marked. She was scanned, free breathing. I do not see the need or benefit of deep inspiration and breath-hold tangents. She is setup to medial and lateral left breast tangents. 2 separate multileaf collimators were designed to conform the field. Care was taken to avoid irradiation of her heart. An isodose plan and dosimetry are requested. I prescribing 5000 cGy in 25 sessions utilizing mixed energy photons. This was followed by electron beam boost for a further 1000 cGy in 5 sessions utilizing 18 MEV electrons. Electron beam energy was chosen based on the depth of her tumor bed is seen on her CT scan.

## 2011-07-25 ENCOUNTER — Ambulatory Visit
Admission: RE | Admit: 2011-07-25 | Discharge: 2011-07-25 | Disposition: A | Discharge status: Home / Self Care | Payer: 59 | Source: Ambulatory Visit | Attending: Radiation Oncology | Admitting: Radiation Oncology

## 2011-07-25 ENCOUNTER — Encounter: Payer: Self-pay | Admitting: Radiation Oncology

## 2011-07-25 DIAGNOSIS — D0510 Intraductal carcinoma in situ of unspecified breast: Secondary | ICD-10-CM

## 2011-07-25 MED ORDER — RADIAPLEXRX EX GEL
Freq: Once | CUTANEOUS | Status: AC
  Administered 2011-07-25: 13:00:00 via TOPICAL

## 2011-07-25 MED ORDER — ALRA NON-METALLIC DEODORANT (RAD-ONC)
1.0000 "application " | Freq: Once | TOPICAL | Status: AC
  Administered 2011-07-25: 1 via TOPICAL

## 2011-07-25 NOTE — Progress Notes (Signed)
Simulation verification note:  The patient underwent similar to verification today for treatment to her left breast. Her isocenter was in good position and the multileaf collimators contoured the target volume appropriate.

## 2011-07-25 NOTE — Progress Notes (Signed)
Received patient and her husband in the clinic today for post sim education. Patient is alert and oriented to person, place, and time. No distress noted. Steady gait noted. Pleasant affect noted. Patient denies pain at this time. Oriented patient to the staff and routine of the clinic. Provided patient with Alra and Radiaplex. Reviewed use of these products with the patient. Provided patient with business card and encouraged to call with needs. Provided patient with RADIATION THERAPY AND YOU handbook and reviewed pertinent information. All questions answered. Patient and her husband verbalized understanding of all things reviewed.

## 2011-07-26 ENCOUNTER — Ambulatory Visit
Admission: RE | Admit: 2011-07-26 | Discharge: 2011-07-26 | Disposition: A | Discharge status: Home / Self Care | Payer: 59 | Source: Ambulatory Visit | Attending: Radiation Oncology | Admitting: Radiation Oncology

## 2011-07-26 ENCOUNTER — Encounter: Payer: Self-pay | Admitting: Radiation Oncology

## 2011-07-26 VITALS — BP 145/80 | HR 74 | Resp 18 | Wt 240.9 lb

## 2011-07-26 DIAGNOSIS — D0510 Intraductal carcinoma in situ of unspecified breast: Secondary | ICD-10-CM

## 2011-07-26 NOTE — Progress Notes (Signed)
Weekly Management Note:  Site:L Breast Current Dose:  200  cGy Projected Dose: 6000  cGy  Narrative: The patient is seen today for routine under treatment assessment. CBCT/MVCT images/port films were reviewed. The chart was reviewed.  No new complaints today. She still has mild to moderate left breast discomfort. She is Radioplex gel to use when necessary.  Physical Examination:  Filed Vitals:   07/26/11 0941  BP: 145/80  Pulse: 74  Resp: 18  .  Weight: 240 lb 14.4 oz (109.272 kg). No skin changes. She still has some crusting along her partial mastectomy wound. There is no drainage.  Impression: Tolerating radiation therapy well.  Plan: Continue radiation therapy as planned.

## 2011-07-26 NOTE — Progress Notes (Signed)
Patient presents to the clinic today unaccompanied for under treat visit with Dr. Dayton Scrape. Patient is alert and oriented to person, place, and time. No distress noted. Steady gait noted. Pleasant affect noted. Patient denies pain at this time. Patient has no complaints. Reported all findings to Dr. Dayton Scrape.

## 2011-07-27 ENCOUNTER — Ambulatory Visit
Admission: RE | Admit: 2011-07-27 | Discharge: 2011-07-27 | Disposition: A | Discharge status: Home / Self Care | Payer: 59 | Source: Ambulatory Visit | Attending: Radiation Oncology | Admitting: Radiation Oncology

## 2011-07-28 ENCOUNTER — Ambulatory Visit
Admission: RE | Admit: 2011-07-28 | Discharge: 2011-07-28 | Disposition: A | Discharge status: Home / Self Care | Payer: 59 | Source: Ambulatory Visit | Attending: Radiation Oncology | Admitting: Radiation Oncology

## 2011-07-29 ENCOUNTER — Ambulatory Visit
Admission: RE | Admit: 2011-07-29 | Discharge: 2011-07-29 | Disposition: A | Discharge status: Home / Self Care | Payer: 59 | Source: Ambulatory Visit | Attending: Radiation Oncology | Admitting: Radiation Oncology

## 2011-07-29 NOTE — Telephone Encounter (Signed)
XXXX 

## 2011-07-29 NOTE — Telephone Encounter (Signed)
xxx

## 2011-07-29 NOTE — Telephone Encounter (Signed)
XXX

## 2011-08-01 ENCOUNTER — Ambulatory Visit
Admission: RE | Admit: 2011-08-01 | Discharge: 2011-08-01 | Disposition: A | Discharge status: Home / Self Care | Payer: 59 | Source: Ambulatory Visit | Attending: Radiation Oncology | Admitting: Radiation Oncology

## 2011-08-01 ENCOUNTER — Encounter: Payer: Self-pay | Admitting: Radiation Oncology

## 2011-08-01 VITALS — BP 145/86 | HR 74 | Resp 18 | Wt 242.3 lb

## 2011-08-01 DIAGNOSIS — C50919 Malignant neoplasm of unspecified site of unspecified female breast: Secondary | ICD-10-CM

## 2011-08-01 NOTE — Progress Notes (Signed)
Weekly Management Note:  Site:L Breast Current Dose:  1000  cGy Projected Dose: 6000  cGy  Narrative: The patient is seen today for routine under treatment assessment. CBCT/MVCT images/port films were reviewed. The chart was reviewed.   She continues to have moderate left breast discomfort along with "shooting pains". She is clinically stable. She has not yet started using Radioplex gel.  Physical Examination:  Filed Vitals:   08/01/11 0915  BP: 145/86  Pulse: 74  Resp: 18  .  Weight: 242 lb 4.8 oz (109.907 kg). No significant skin changes. There is still a small amount of crusting along her partial mastectomy wound. No obvious infection.  Impression: Tolerating radiation therapy well.  Plan: Continue radiation therapy as planned.

## 2011-08-01 NOTE — Progress Notes (Signed)
Patient presents to the clinic today accompanied by her husband for an under treat visit with Dr. Dayton Scrape. Patient is alert and oriented to person, place, and time. No distress noted. Steady gait noted. Pleasant affect noted. Patient denies pain at this time. Patient denies any skin changes. Patient has no complaints at this time. Patient reports intermittent brief episodes of sharp shooting pain in her left/treated breast.

## 2011-08-02 ENCOUNTER — Ambulatory Visit
Admission: RE | Admit: 2011-08-02 | Discharge: 2011-08-02 | Disposition: A | Discharge status: Home / Self Care | Payer: 59 | Source: Ambulatory Visit | Attending: Radiation Oncology | Admitting: Radiation Oncology

## 2011-08-03 ENCOUNTER — Telehealth: Payer: Self-pay | Admitting: Oncology

## 2011-08-03 ENCOUNTER — Encounter (INDEPENDENT_AMBULATORY_CARE_PROVIDER_SITE_OTHER): Payer: Self-pay

## 2011-08-03 ENCOUNTER — Ambulatory Visit (HOSPITAL_BASED_OUTPATIENT_CLINIC_OR_DEPARTMENT_OTHER): Payer: 59 | Admitting: Oncology

## 2011-08-03 ENCOUNTER — Ambulatory Visit
Admission: RE | Admit: 2011-08-03 | Discharge: 2011-08-03 | Disposition: A | Discharge status: Home / Self Care | Payer: 59 | Source: Ambulatory Visit | Attending: Radiation Oncology | Admitting: Radiation Oncology

## 2011-08-03 ENCOUNTER — Other Ambulatory Visit: Payer: 59 | Admitting: Lab

## 2011-08-03 ENCOUNTER — Encounter: Payer: Self-pay | Admitting: Oncology

## 2011-08-03 DIAGNOSIS — D0590 Unspecified type of carcinoma in situ of unspecified breast: Secondary | ICD-10-CM

## 2011-08-03 DIAGNOSIS — D0510 Intraductal carcinoma in situ of unspecified breast: Secondary | ICD-10-CM

## 2011-08-03 DIAGNOSIS — C50919 Malignant neoplasm of unspecified site of unspecified female breast: Secondary | ICD-10-CM

## 2011-08-03 DIAGNOSIS — Z170 Estrogen receptor positive status [ER+]: Secondary | ICD-10-CM

## 2011-08-03 LAB — CBC WITH DIFFERENTIAL/PLATELET
BASO%: 0.5 % (ref 0.0–2.0)
EOS%: 2.7 % (ref 0.0–7.0)
HCT: 41.9 % (ref 34.8–46.6)
MCH: 28.2 pg (ref 25.1–34.0)
MCHC: 33.4 g/dL (ref 31.5–36.0)
MONO#: 0.6 10*3/uL (ref 0.1–0.9)
RBC: 4.97 10*6/uL (ref 3.70–5.45)
RDW: 15.1 % — ABNORMAL HIGH (ref 11.2–14.5)
WBC: 9.5 10*3/uL (ref 3.9–10.3)
lymph#: 3.2 10*3/uL (ref 0.9–3.3)

## 2011-08-03 NOTE — Patient Instructions (Signed)
1. Continue radiation as you are. You will finish up radiation on September 05 2011  2. I will see you back on 4/23 with blood work. 3. Please call with any problems at [phone] and ask for Dr. Rosine Beat nurse

## 2011-08-03 NOTE — Telephone Encounter (Signed)
gve the pt her April 2013 appt calendar 

## 2011-08-03 NOTE — Progress Notes (Signed)
OFFICE PROGRESS NOTE  CC  Thayer Headings, MD, MD [address], [address] [facility] [place] [address] Dr. Ovidio Kin Dr. 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 Patient was originally seen in the multidisciplinary breast clinic on 07/11/2010.  PRIOR THERAPY:  #1 patient is status post left breast lumpectomy performed on 05/16/2011. The final pathology revealed a small focus of intermediate grade DCIS measuring 0.17 cm. The DCIS came close to 0.8 cm of the anterior margin. Remaining margins were free of carcinoma. The tumor was estrogen receptor +99% progesterone receptor +100%. Pathologic staging Tis NX.  #2 patient is seen by Dr. Chipper Herb for consideration of radiation therapy. She has a followup appointment with him on January 21.  #3 patient underwent genetic testing and counseling and she is negative for comprehensive analysis of BRCA1 and 2.  #3 patient is also being considered for NSABP B- 43 clinical study and she will meet with one of our  Clinical research nurses  CURRENT THERAPY:patient will receive radiation therapy and possible begin enrollment on NSABP B- 43 trial.  INTERVAL HISTORY: Maria Bolton 66 y.o. female returns for followup visit post lumpectomy. Overall she is doing well she is without any significant complaints.  Patient is tolerating the radiation well. She is having some tenderness in the surgical site but there is no evidence of redness or erythema. She otherwise is with out any complaints.Remainder of the 10 point review of systems is negative.  MEDICAL HISTORY: Past Medical History  Diagnosis Date  . Breast cancer 04/2011    DCIS left breast , ER/PR+  . Hypertension 05/11/2011  . Difficulty sleeping     since dx of cancer    ALLERGIES:   has no known allergies.  MEDICATIONS:  Current Outpatient Prescriptions  Medication Sig Dispense Refill  . acetaminophen  (TYLENOL) 500 MG tablet Take 500 mg by mouth every 6 (six) hours as needed.        . cephALEXin (KEFLEX) 500 MG capsule       . cholecalciferol (VITAMIN D) 1000 UNITS tablet Take 1,000 Units by mouth daily.        . diflorasone-emollient (APEXICON E) 0.05 % CREA Apply topically 2 (two) times daily.        Marland Kitchen HYDROcodone-acetaminophen (NORCO) 5-325 MG per tablet       . losartan (COZAAR) 100 MG tablet Take 100 mg by mouth daily.          SURGICAL HISTORY:  Past Surgical History  Procedure Date  . Hip surgery 1982  . Nevus excision 05/16/2011    Procedure: NEVUS EXCISION;  Surgeon: Kandis Cocking, MD;  Location: [facility];  Service: General;  Laterality: Left;  remove mole left breast  . Breast lumpectomy 05/16/2011    Procedure: LUMPECTOMY;  Surgeon: Kandis Cocking, MD;  Location: [facility];  Service: General;  Laterality: Left;  Needle localization Left breast lumpectomy    REVIEW OF SYSTEMS:  Pertinent items are noted in HPI.   PHYSICAL EXAMINATION: General appearance: alert, cooperative and appears stated age Head: Normocephalic, without obvious abnormality, atraumatic Neck: no adenopathy, no carotid bruit, no JVD, supple, symmetrical, trachea midline and thyroid not enlarged, symmetric, no tenderness/mass/nodules Lymph nodes: Cervical, supraclavicular, and axillary nodes normal. Resp: clear to auscultation bilaterally and normal percussion bilaterally Back: symmetric, no curvature. ROM normal. No CVA tenderness. Cardio: regular rate and rhythm, S1, S2 normal, no murmur, click, rub or  gallop and normal apical impulse GI: soft, non-tender; bowel sounds normal; no masses,  no organomegaly Extremities: extremities normal, atraumatic, no cyanosis or edema Neurologic: Alert and oriented X 3, normal strength and tone. Normal symmetric reflexes. Normal coordination and gait Bilateral breast examination. Left breast reveals a well-healed lumpectomy scar without  any masses or skin redness. Right breast no masses or nipple discharge. ECOG PERFORMANCE STATUS: 0 - Asymptomatic  Blood pressure 173/93, pulse 92, temperature 97.6 F (36.4 C), height 5\' 5"  (1.651 m), weight 241 lb 9.6 oz (109.589 kg).  LABORATORY DATA: Lab Results  Component Value Date   WBC 9.5 08/03/2011   HGB 14.0 08/03/2011   HCT 41.9 08/03/2011   MCV 84.4 08/03/2011   PLT 247 08/03/2011      Chemistry      Component Value Date/Time   NA 142 06/20/2011 1055   K 3.9 06/20/2011 1055   CL 105 06/20/2011 1055   CO2 29 06/20/2011 1055   BUN 14 06/20/2011 1055   CREATININE 0.70 06/20/2011 1055      Component Value Date/Time   CALCIUM 9.4 06/20/2011 1055   ALKPHOS 78 06/20/2011 1055   AST 14 06/20/2011 1055   ALT 11 06/20/2011 1055   BILITOT 0.4 06/20/2011 1055       RADIOGRAPHIC STUDIES:  No results found.  ASSESSMENT: 66 year old female with:  1.  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 She is status post lumpectomy on 05/16/2011. The tumor was estrogen receptor +99% progesterone receptor +100%.   2. Patient is getting radiation currently and will finish radiation April1, 2013  PLAN:   #1 DCIS, complete radiation and then will begin anti-estrogen therapy  #2. I will see her back after completion of RT in April  All questions were answered. The patient knows to call the clinic with any problems, questions or concerns. We can certainly see the patient much sooner if necessary.  I spent 20 minutes counseling the patient face to face. The total time spent in the appointment was 30 minutes.    Drue Second, MD Medical/Oncology [facility] [phone] (beeper) [phone] (Office)  08/03/2011, 9:47 AM

## 2011-08-04 ENCOUNTER — Ambulatory Visit
Admission: RE | Admit: 2011-08-04 | Discharge: 2011-08-04 | Disposition: A | Discharge status: Home / Self Care | Payer: 59 | Source: Ambulatory Visit | Attending: Radiation Oncology | Admitting: Radiation Oncology

## 2011-08-05 ENCOUNTER — Ambulatory Visit
Admission: RE | Admit: 2011-08-05 | Discharge: 2011-08-05 | Disposition: A | Discharge status: Home / Self Care | Payer: 59 | Source: Ambulatory Visit | Attending: Radiation Oncology | Admitting: Radiation Oncology

## 2011-08-08 ENCOUNTER — Ambulatory Visit
Admission: RE | Admit: 2011-08-08 | Discharge: 2011-08-08 | Disposition: A | Discharge status: Home / Self Care | Payer: 59 | Source: Ambulatory Visit | Attending: Radiation Oncology | Admitting: Radiation Oncology

## 2011-08-08 DIAGNOSIS — D0510 Intraductal carcinoma in situ of unspecified breast: Secondary | ICD-10-CM

## 2011-08-08 NOTE — Progress Notes (Signed)
C/o pain at op site , rates 4/10.  Takes Tylenol for this with no relief.  Says she can't sleep at night due to the pain.  Noted open area in healing process without drainage but she says it is still painful and hurts when trying to sleep at night

## 2011-08-08 NOTE — Progress Notes (Signed)
Weekly Management Note:  Site:L Breast Current Dose:  2000  cGy Projected Dose: 6000  cGy  Narrative: The patient is seen today for routine under treatment assessment. CBCT/MVCT images/port films were reviewed. The chart was reviewed.   Her left breast remains sore to touch. She uses Radioplex gel when necessary.  Physical Examination: There were no vitals filed for this visit..  Weight:  . Faint erythema the skin.  Impression: Tolerating radiation therapy well.  Plan: Continue radiation therapy as planned.

## 2011-08-09 ENCOUNTER — Ambulatory Visit
Admission: RE | Admit: 2011-08-09 | Discharge: 2011-08-09 | Disposition: A | Discharge status: Home / Self Care | Payer: 59 | Source: Ambulatory Visit | Attending: Radiation Oncology | Admitting: Radiation Oncology

## 2011-08-09 DIAGNOSIS — Z79899 Other long term (current) drug therapy: Secondary | ICD-10-CM | POA: Insufficient documentation

## 2011-08-09 DIAGNOSIS — Z170 Estrogen receptor positive status [ER+]: Secondary | ICD-10-CM | POA: Insufficient documentation

## 2011-08-09 DIAGNOSIS — D0590 Unspecified type of carcinoma in situ of unspecified breast: Secondary | ICD-10-CM | POA: Insufficient documentation

## 2011-08-09 DIAGNOSIS — Z510 Encounter for antineoplastic radiation therapy: Secondary | ICD-10-CM | POA: Insufficient documentation

## 2011-08-09 DIAGNOSIS — Z809 Family history of malignant neoplasm, unspecified: Secondary | ICD-10-CM | POA: Insufficient documentation

## 2011-08-10 ENCOUNTER — Ambulatory Visit
Admission: RE | Admit: 2011-08-10 | Discharge: 2011-08-10 | Disposition: A | Discharge status: Home / Self Care | Payer: 59 | Source: Ambulatory Visit | Attending: Radiation Oncology | Admitting: Radiation Oncology

## 2011-08-11 ENCOUNTER — Ambulatory Visit
Admission: RE | Admit: 2011-08-11 | Discharge: 2011-08-11 | Disposition: A | Discharge status: Home / Self Care | Payer: 59 | Source: Ambulatory Visit | Attending: Radiation Oncology | Admitting: Radiation Oncology

## 2011-08-12 ENCOUNTER — Ambulatory Visit
Admission: RE | Admit: 2011-08-12 | Discharge: 2011-08-12 | Disposition: A | Discharge status: Home / Self Care | Payer: 59 | Source: Ambulatory Visit | Attending: Radiation Oncology | Admitting: Radiation Oncology

## 2011-08-15 ENCOUNTER — Ambulatory Visit
Admission: RE | Admit: 2011-08-15 | Discharge: 2011-08-15 | Disposition: A | Discharge status: Home / Self Care | Payer: 59 | Source: Ambulatory Visit | Attending: Radiation Oncology | Admitting: Radiation Oncology

## 2011-08-15 VITALS — Wt 242.2 lb

## 2011-08-15 DIAGNOSIS — D0510 Intraductal carcinoma in situ of unspecified breast: Secondary | ICD-10-CM

## 2011-08-15 NOTE — Progress Notes (Signed)
Weekly Management Note:  Site:L Breast Current Dose:  3000  cGy Projected Dose: 6000  cGy  Narrative: The patient is seen today for routine under treatment assessment. CBCT/MVCT images/port films were reviewed. The chart was reviewed.   No new complaints today. She still has mild left breast discomfort.  Physical Examination: .  Weight: 242 lb 3.2 oz (109.861 kg). There is mild to moderate erythema the skin with no areas of desquamation.  Impression: Tolerating radiation therapy well.  Plan: Continue radiation therapy as planned.

## 2011-08-15 NOTE — Progress Notes (Signed)
Here for routine weekly md assessment of radiation to the left breast. Very mild skin changes. Skin tags more prominent. Mild discomfort. Mild fatigue.Takes daily nap.

## 2011-08-16 ENCOUNTER — Ambulatory Visit
Admission: RE | Admit: 2011-08-16 | Discharge: 2011-08-16 | Disposition: A | Discharge status: Home / Self Care | Payer: 59 | Source: Ambulatory Visit | Attending: Radiation Oncology | Admitting: Radiation Oncology

## 2011-08-17 ENCOUNTER — Ambulatory Visit
Admission: RE | Admit: 2011-08-17 | Discharge: 2011-08-17 | Disposition: A | Discharge status: Home / Self Care | Payer: 59 | Source: Ambulatory Visit | Attending: Radiation Oncology | Admitting: Radiation Oncology

## 2011-08-18 ENCOUNTER — Ambulatory Visit
Admission: RE | Admit: 2011-08-18 | Discharge: 2011-08-18 | Disposition: A | Discharge status: Home / Self Care | Payer: 59 | Source: Ambulatory Visit | Attending: Radiation Oncology | Admitting: Radiation Oncology

## 2011-08-18 ENCOUNTER — Encounter: Payer: Self-pay | Admitting: Radiation Oncology

## 2011-08-18 NOTE — Progress Notes (Signed)
Electron beam simulation note:  The patient underwent virtual electron beam simulation before her left breast boost. She was set up en face to her left breast tumor bed. One custom block was constructed to conform the field. A special port plan was requested. I am prescribing 1000 cGy in 5 sessions utilizing 18 MEV electrons. Electron beam energy was chosen based on the depth of her tumor bed as seen on her CT scan.

## 2011-08-18 NOTE — Progress Notes (Signed)
Weekly Management Note:  Site:L Breast Current Dose:  3600  cGy Projected Dose: 5000  cGy  Narrative: The patient is seen today for routine under treatment assessment. CBCT/MVCT images/port films were reviewed. The chart was reviewed.   She is having less breast discomfort.  Physical Examination: There were no vitals filed for this visit..  Weight:  . There is mild erythema and hyperpigmentation of the skin along the left breast with no areas of desquamation.  Impression: Tolerating radiation therapy well.  Plan: Continue radiation therapy as planned.

## 2011-08-19 ENCOUNTER — Ambulatory Visit
Admission: RE | Admit: 2011-08-19 | Discharge: 2011-08-19 | Disposition: A | Discharge status: Home / Self Care | Payer: 59 | Source: Ambulatory Visit | Attending: Radiation Oncology | Admitting: Radiation Oncology

## 2011-08-22 ENCOUNTER — Ambulatory Visit
Admission: RE | Admit: 2011-08-22 | Discharge: 2011-08-22 | Disposition: A | Discharge status: Home / Self Care | Payer: 59 | Source: Ambulatory Visit | Attending: Radiation Oncology | Admitting: Radiation Oncology

## 2011-08-23 ENCOUNTER — Ambulatory Visit
Admission: RE | Admit: 2011-08-23 | Discharge: 2011-08-23 | Disposition: A | Discharge status: Home / Self Care | Payer: 59 | Source: Ambulatory Visit | Attending: Radiation Oncology | Admitting: Radiation Oncology

## 2011-08-23 ENCOUNTER — Encounter: Payer: Self-pay | Admitting: Radiation Oncology

## 2011-08-23 VITALS — Wt 242.2 lb

## 2011-08-23 DIAGNOSIS — C50919 Malignant neoplasm of unspecified site of unspecified female breast: Secondary | ICD-10-CM

## 2011-08-23 NOTE — Progress Notes (Signed)
Weekly Management Note:  Site:L Breast Current Dose:  4200  cGy Projected Dose: 6000  cGy  Narrative: The patient is seen today for routine under treatment assessment. CBCT/MVCT images/port films were reviewed. The chart was reviewed.   Slight breast tendernous. Using R P Gel prn.  Physical Examination: There were no vitals filed for this visit..  Weight: 242 lb 3.2 oz (109.861 kg). Hyperpigmentation of skin with moderate erythema. No desquamation.  Impression: Tolerating radiation therapy well.  Plan: Continue radiation therapy as planned.

## 2011-08-23 NOTE — Progress Notes (Signed)
Pt c/o "tenderness/soreness L breast". Pt does not take any meds for this.

## 2011-08-24 ENCOUNTER — Ambulatory Visit
Admission: RE | Admit: 2011-08-24 | Discharge: 2011-08-24 | Disposition: A | Discharge status: Home / Self Care | Payer: 59 | Source: Ambulatory Visit | Attending: Radiation Oncology | Admitting: Radiation Oncology

## 2011-08-25 ENCOUNTER — Ambulatory Visit
Admission: RE | Admit: 2011-08-25 | Discharge: 2011-08-25 | Disposition: A | Discharge status: Home / Self Care | Payer: 59 | Source: Ambulatory Visit | Attending: Radiation Oncology | Admitting: Radiation Oncology

## 2011-08-26 ENCOUNTER — Ambulatory Visit
Admission: RE | Admit: 2011-08-26 | Discharge: 2011-08-26 | Disposition: A | Discharge status: Home / Self Care | Payer: 59 | Source: Ambulatory Visit | Attending: Radiation Oncology | Admitting: Radiation Oncology

## 2011-08-29 ENCOUNTER — Ambulatory Visit
Admission: RE | Admit: 2011-08-29 | Discharge: 2011-08-29 | Disposition: A | Discharge status: Home / Self Care | Payer: 59 | Source: Ambulatory Visit | Attending: Radiation Oncology | Admitting: Radiation Oncology

## 2011-08-29 ENCOUNTER — Encounter: Payer: Self-pay | Admitting: Radiation Oncology

## 2011-08-29 VITALS — BP 136/85 | HR 84 | Resp 18 | Wt 243.0 lb

## 2011-08-29 DIAGNOSIS — C50919 Malignant neoplasm of unspecified site of unspecified female breast: Secondary | ICD-10-CM

## 2011-08-29 NOTE — Progress Notes (Signed)
Weekly Management Note:  Site:L Breast Current Dose:  5000  cGy Projected Dose: 6000  cGy  Narrative: The patient is seen today for routine under treatment assessment. CBCT/MVCT images/port films were reviewed. The chart was reviewed.   She is without new complaints today. She does report some "swelling" along her left axilla with irritation.  Physical Examination:  Filed Vitals:   08/29/11 0928  BP: 136/85  Pulse: 84  Resp: 18  .  Weight: 243 lb (110.224 kg). There is moderate erythema all of hyperpigmentation of her skin the left breast with focal dry desquamation along her lower left axilla and inframammary region.  Impression: Tolerating radiation therapy well.  Plan: Continue radiation therapy as planned.

## 2011-08-29 NOTE — Progress Notes (Signed)
Patient presents to the clinic today accompanied by her husband for under treat visit with Dr. Dayton Scrape. Patient is alert and oriented to person, place, and time. No distress noted. Steady gait noted. Pleasant affect noted. Patient denies pain at this time. Patient reports hyperpigmentation of left/treated breast but, denies breaks in the skin. Patient reports using Radiaplex as directed. Patient denies nausea, vomiting, headache, dizziness or diarrhea. Patient has no complaints at this time. Reported all findings to Dr. Dayton Scrape.

## 2011-08-30 ENCOUNTER — Ambulatory Visit
Admission: RE | Admit: 2011-08-30 | Discharge: 2011-08-30 | Disposition: A | Discharge status: Home / Self Care | Payer: 59 | Source: Ambulatory Visit | Attending: Radiation Oncology | Admitting: Radiation Oncology

## 2011-08-31 ENCOUNTER — Ambulatory Visit
Admission: RE | Admit: 2011-08-31 | Discharge: 2011-08-31 | Disposition: A | Discharge status: Home / Self Care | Payer: 59 | Source: Ambulatory Visit | Attending: Radiation Oncology | Admitting: Radiation Oncology

## 2011-09-01 ENCOUNTER — Ambulatory Visit
Admission: RE | Admit: 2011-09-01 | Discharge: 2011-09-01 | Disposition: A | Discharge status: Home / Self Care | Payer: 59 | Source: Ambulatory Visit | Attending: Radiation Oncology | Admitting: Radiation Oncology

## 2011-09-01 DIAGNOSIS — D0510 Intraductal carcinoma in situ of unspecified breast: Secondary | ICD-10-CM

## 2011-09-01 NOTE — Progress Notes (Signed)
Here for weekly routine md assessment of left breast radiation. Currently completed 3 of 5 of boost. Skin with mild hyperpigmentation. Has some breast tenderness. To continue application of radiaplex. Moderate fatigue.

## 2011-09-01 NOTE — Progress Notes (Signed)
Weekly Management Note:  Site:L Breast Current Dose:  5600  cGy Projected Dose: 6000  cGy  Narrative: The patient is seen today for routine under treatment assessment. CBCT/MVCT images/port films were reviewed. The chart was reviewed.   She is without complaints today. She continues with her Radioplex gel.  Physical Examination: There were no vitals filed for this visit..  Weight:  . There remains hyperpigmentation of the skin with focal dry desquamation along the axilla and inframammary region.  Impression: Tolerating radiation therapy well.  Plan: Continue radiation therapy as planned. She will finish her radiation therapy on Monday. I am scheduling her for a one-month followup visit.

## 2011-09-02 ENCOUNTER — Ambulatory Visit
Admission: RE | Admit: 2011-09-02 | Discharge: 2011-09-02 | Disposition: A | Discharge status: Home / Self Care | Payer: 59 | Source: Ambulatory Visit | Attending: Radiation Oncology | Admitting: Radiation Oncology

## 2011-09-05 ENCOUNTER — Ambulatory Visit
Admission: RE | Admit: 2011-09-05 | Discharge: 2011-09-05 | Disposition: A | Discharge status: Home / Self Care | Payer: 59 | Source: Ambulatory Visit | Attending: Radiation Oncology | Admitting: Radiation Oncology

## 2011-09-10 ENCOUNTER — Encounter: Payer: Self-pay | Admitting: Radiation Oncology

## 2011-09-10 NOTE — Progress Notes (Signed)
[facility] Radiation Oncology End of Treatment Note  Name:Maria Bolton  Date: 09/10/2011 ZOX:[id number] DOB:05-25-1946   Status:outpatient    CC: Thayer Headings, MD, MD  , Dr. Ovidio Kin  REFERRING PHYSICIAN: Dr. Ovidio Kin   DIAGNOSIS: Stage 0 (Tis N0 M0) DCIS of the left breast   INDICATION FOR TREATMENT: Curative   TREATMENT DATES: 07/26/2011 through 09/05/2011                          SITE/DOSE: Left breast 5000 cGy 25 sessions, left breast boost 1000 cGy 5 sessions                            BEAMS/ENERGY: Mixed 6 MV, 10 MV photons left breast changes. 18 MEV electrons left breast boost                  NARRATIVE:  She tolerated radiation therapy well although she had focal dry desquamation along the left inframammary region and axilla by completion of therapy.                          PLAN: Routine followup in one month. Patient instructed to call if questions or worsening complaints in interim.

## 2011-09-27 ENCOUNTER — Encounter: Payer: Self-pay | Admitting: Oncology

## 2011-09-27 ENCOUNTER — Telehealth: Payer: Self-pay | Admitting: Oncology

## 2011-09-27 ENCOUNTER — Ambulatory Visit (HOSPITAL_BASED_OUTPATIENT_CLINIC_OR_DEPARTMENT_OTHER): Payer: 59 | Admitting: Oncology

## 2011-09-27 ENCOUNTER — Other Ambulatory Visit (HOSPITAL_BASED_OUTPATIENT_CLINIC_OR_DEPARTMENT_OTHER): Payer: 59 | Admitting: Lab

## 2011-09-27 VITALS — BP 142/84 | HR 90 | Temp 98.6°F | Ht 65.0 in | Wt 243.4 lb

## 2011-09-27 DIAGNOSIS — D0590 Unspecified type of carcinoma in situ of unspecified breast: Secondary | ICD-10-CM

## 2011-09-27 DIAGNOSIS — R5381 Other malaise: Secondary | ICD-10-CM

## 2011-09-27 DIAGNOSIS — C50919 Malignant neoplasm of unspecified site of unspecified female breast: Secondary | ICD-10-CM

## 2011-09-27 DIAGNOSIS — D0510 Intraductal carcinoma in situ of unspecified breast: Secondary | ICD-10-CM

## 2011-09-27 LAB — CBC WITH DIFFERENTIAL/PLATELET
Basophils Absolute: 0 10*3/uL (ref 0.0–0.1)
EOS%: 2.9 % (ref 0.0–7.0)
HCT: 40.6 % (ref 34.8–46.6)
HGB: 13.6 g/dL (ref 11.6–15.9)
MONO#: 0.5 10*3/uL (ref 0.1–0.9)
NEUT#: 4.2 10*3/uL (ref 1.5–6.5)
NEUT%: 58.7 % (ref 38.4–76.8)
RDW: 15 % — ABNORMAL HIGH (ref 11.2–14.5)
WBC: 7.2 10*3/uL (ref 3.9–10.3)
lymph#: 2.2 10*3/uL (ref 0.9–3.3)

## 2011-09-27 LAB — COMPREHENSIVE METABOLIC PANEL
AST: 15 U/L (ref 0–37)
Albumin: 3.9 g/dL (ref 3.5–5.2)
Alkaline Phosphatase: 74 U/L (ref 39–117)
BUN: 14 mg/dL (ref 6–23)
Calcium: 9.2 mg/dL (ref 8.4–10.5)
Chloride: 107 mEq/L (ref 96–112)
Glucose, Bld: 104 mg/dL — ABNORMAL HIGH (ref 70–99)
Potassium: 4 mEq/L (ref 3.5–5.3)
Sodium: 140 mEq/L (ref 135–145)
Total Protein: 6.7 g/dL (ref 6.0–8.3)

## 2011-09-27 MED ORDER — OXYCODONE-ACETAMINOPHEN 5-325 MG PO TABS: 1.0000 | ORAL_TABLET | Freq: Three times a day (TID) | ORAL | Status: AC | PRN

## 2011-09-27 MED ORDER — TAMOXIFEN CITRATE 20 MG PO TABS: 20.0000 mg | ORAL_TABLET | Freq: Every day | ORAL | Status: AC

## 2011-09-27 MED ORDER — LORAZEPAM 0.5 MG PO TABS: 0.5000 mg | ORAL_TABLET | Freq: Three times a day (TID) | ORAL | Status: AC | PRN

## 2011-09-27 NOTE — Progress Notes (Signed)
OFFICE PROGRESS NOTE  CC  Thayer Headings, MD, MD [address] [facility] [place] [address] Dr. Ovidio Kin Dr. 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 Patient was originally seen in the multidisciplinary breast clinic on 07/11/2010.  PRIOR THERAPY:  #1 patient is status post left breast lumpectomy performed on 05/16/2011. The final pathology revealed a small focus of intermediate grade DCIS measuring 0.17 cm. The DCIS came close to 0.8 cm of the anterior margin. Remaining margins were free of carcinoma. The tumor was estrogen receptor +99% progesterone receptor +100%. Pathologic staging Tis NX.  #2 patient is seen by Dr. Chipper Herb for consideration of radiation therapy. She has a followup appointment with him on January 21.  #3 patient underwent genetic testing and counseling and she is negative for comprehensive analysis of BRCA1 and 2.  #4. S/P radiation therapy  #5. Tamoxifen 20 mg daily begin 09/27/11  CURRENT THERAPY: Begin tamoxifen 20 mg daily  INTERVAL HISTORY: Maria Bolton 66 y.o. female returns for followup visit post lumpectomy. She tolerated radiation well. She has minimal radiation-induced skin changes. Nice any fevers chills night sweats headaches shortness of breath chest pains palpitations she does have some fatigue. She is having some difficulty.sleeping at night since diagnosis of her cancer. The 10 point review of systems is negative. MEDICAL HISTORY: Past Medical History  Diagnosis Date  . Breast cancer 04/2011    DCIS left breast , ER/PR+  . Hypertension 05/11/2011  . Difficulty sleeping     since dx of cancer    ALLERGIES:   has no known allergies.  MEDICATIONS:  Current Outpatient Prescriptions  Medication Sig Dispense Refill  . acetaminophen (TYLENOL) 500 MG tablet Take 500 mg by mouth every 6 (six) hours as needed.        . cholecalciferol (VITAMIN  D) 1000 UNITS tablet Take 1,000 Units by mouth daily.        Marland Kitchen losartan (COZAAR) 100 MG tablet Take 100 mg by mouth daily.        . non-metallic deodorant Thornton Papas) MISC Apply 1 application topically daily as needed.      . Wound Cleansers (RADIAPLEX EX) Apply topically.      . diflorasone-emollient (APEXICON E) 0.05 % CREA Apply topically 2 (two) times daily.        Marland Kitchen HYDROcodone-acetaminophen (NORCO) 5-325 MG per tablet         SURGICAL HISTORY:  Past Surgical History  Procedure Date  . Hip surgery 1982  . Nevus excision 05/16/2011    Procedure: NEVUS EXCISION;  Surgeon: Kandis Cocking, MD;  Location: [facility];  Service: General;  Laterality: Left;  remove mole left breast  . Breast lumpectomy 05/16/2011    Procedure: LUMPECTOMY;  Surgeon: Kandis Cocking, MD;  Location: [facility];  Service: General;  Laterality: Left;  Needle localization Left breast lumpectomy    REVIEW OF SYSTEMS:  Pertinent items are noted in HPI.   PHYSICAL EXAMINATION: General appearance: alert, cooperative and appears stated age Head: Normocephalic, without obvious abnormality, atraumatic Neck: no adenopathy, no carotid bruit, no JVD, supple, symmetrical, trachea midline and thyroid not enlarged, symmetric, no tenderness/mass/nodules Lymph nodes: Cervical, supraclavicular, and axillary nodes normal. Resp: clear to auscultation bilaterally and normal percussion bilaterally Back: symmetric, no curvature. ROM normal. No CVA tenderness. Cardio: regular rate and rhythm, S1, S2 normal, no murmur, click, rub or gallop and normal apical impulse GI: soft, non-tender;  bowel sounds normal; no masses,  no organomegaly Extremities: extremities normal, atraumatic, no cyanosis or edema Neurologic: Alert and oriented X 3, normal strength and tone. Normal symmetric reflexes. Normal coordination and gait Bilateral breast examination. Left breast reveals a well-healed lumpectomy scar without any  masses or skin redness. Right breast no masses or nipple discharge. ECOG PERFORMANCE STATUS: 0 - Asymptomatic  Blood pressure 142/84, pulse 90, temperature 98.6 F (37 C), temperature source Oral, height 5\' 5"  (1.651 m), weight 243 lb 6.4 oz (110.406 kg).  LABORATORY DATA: Lab Results  Component Value Date   WBC 7.2 09/27/2011   HGB 13.6 09/27/2011   HCT 40.6 09/27/2011   MCV 84.0 09/27/2011   PLT 230 09/27/2011      Chemistry      Component Value Date/Time   NA 142 06/20/2011 1055   K 3.9 06/20/2011 1055   CL 105 06/20/2011 1055   CO2 29 06/20/2011 1055   BUN 14 06/20/2011 1055   CREATININE 0.70 06/20/2011 1055      Component Value Date/Time   CALCIUM 9.4 06/20/2011 1055   ALKPHOS 78 06/20/2011 1055   AST 14 06/20/2011 1055   ALT 11 06/20/2011 1055   BILITOT 0.4 06/20/2011 1055       RADIOGRAPHIC STUDIES:  No results found.  ASSESSMENT: 66 year old female with:  1.  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 She is status post lumpectomy on 05/16/2011. The tumor was estrogen receptor +99% progesterone receptor +100%.   2. S/P radiation therapy  PLAN:  1. She has now completed radiation therapy for DCIS. Overall she tolerated it quite well   2.  we will start her on tamoxifen 20 mg daily risks and benefits of this have been discussed with the patient. Literature was given to her  3. Her back in 3 months time.  All questions were answered. The patient knows to call the clinic with any problems, questions or concerns. We can certainly see the patient much sooner if necessary.  I spent 20 minutes counseling the patient face to face. The total time spent in the appointment was 30 minutes.    Drue Second, MD Medical/Oncology [facility] [phone] (beeper) [phone] (Office)  09/27/2011, 11:04 AM

## 2011-09-27 NOTE — Patient Instructions (Signed)
1. Begin Tamoxifen 1 by mouth daily, your prescription has been sent to your pharmacy  2. Information as below:  Tamoxifen oral tablet What is this medicine? TAMOXIFEN (ta MOX i fen) blocks the effects of estrogen. It is commonly used to treat breast cancer. It is also used to decrease the chance of breast cancer coming back in women who have received treatment for the disease. It may also help prevent breast cancer in women who have a high risk of developing breast cancer. This medicine may be used for other purposes; ask your health care provider or pharmacist if you have questions. What should I tell my health care provider before I take this medicine? They need to know if you have any of these conditions: -blood clots -blood disease -cataracts or impaired eyesight -endometriosis -high calcium levels -high cholesterol -irregular menstrual cycles -liver disease -stroke -uterine fibroids -an unusual or allergic reaction to tamoxifen, other medicines, foods, dyes, or preservatives -pregnant or trying to get pregnant -breast-feeding How should I use this medicine? Take this medicine by mouth with a glass of water. Follow the directions on the prescription label. You can take it with or without food. Take your medicine at regular intervals. Do not take your medicine more often than directed. Do not stop taking except on your doctor's advice. A special MedGuide will be given to you by the pharmacist with each prescription and refill. Be sure to read this information carefully each time. Talk to your pediatrician regarding the use of this medicine in children. While this drug may be prescribed for selected conditions, precautions do apply. Overdosage: If you think you have taken too much of this medicine contact a poison control center or emergency room at once. NOTE: This medicine is only for you. Do not share this medicine with others. What if I miss a dose? If you miss a dose, take it as  soon as you can. If it is almost time for your next dose, take only that dose. Do not take double or extra doses. What may interact with this medicine? -aminoglutethimide -bromocriptine -chemotherapy drugs -female hormones, like estrogens and birth control pills -letrozole -medroxyprogesterone -phenobarbital -rifampin -warfarin This list may not describe all possible interactions. Give your health care provider a list of all the medicines, herbs, non-prescription drugs, or dietary supplements you use. Also tell them if you smoke, drink alcohol, or use illegal drugs. Some items may interact with your medicine. What should I watch for while using this medicine? Visit your doctor or health care professional for regular checks on your progress. You will need regular pelvic exams, breast exams, and mammograms. If you are taking this medicine to reduce your risk of getting breast cancer, you should know that this medicine does not prevent all types of breast cancer. If breast cancer or other problems occur, there is no guarantee that it will be found at an early stage. Do not become pregnant while taking this medicine or for 2 months after stopping this medicine. Stop taking this medicine if you get pregnant or think you are pregnant and contact your doctor. This medicine may harm your unborn baby. Women who can possibly become pregnant should use birth control methods that do not use hormones during tamoxifen treatment and for 2 months after therapy has stopped. Talk with your health care provider for birth control advice. Do not breast feed while taking this medicine. What side effects may I notice from receiving this medicine? Side effects that you should report  to your doctor or health care professional as soon as possible: -changes in vision (blurred vision) -changes in your menstrual cycle -difficulty breathing or shortness of breath -difficulty walking or talking -new breast  lumps -numbness -pelvic pain or pressure -redness, blistering, peeling or loosening of the skin, including inside the mouth -skin rash or itching (hives) -sudden chest pain -swelling of lips, face, or tongue -swelling, pain or tenderness in your calf or leg -unusual bruising or bleeding -vaginal discharge that is bloody, brown, or rust -weakness -yellowing of the whites of the eyes or skin Side effects that usually do not require medical attention (report to your doctor or health care professional if they continue or are bothersome): -fatigue -hair loss, although uncommon and is usually mild -headache -hot flashes -impotence (in men) -nausea, vomiting (mild) -vaginal discharge (white or clear) This list may not describe all possible side effects. Call your doctor for medical advice about side effects. You may report side effects to FDA at 1-800-FDA-1088. Where should I keep my medicine? Keep out of the reach of children. Store at room temperature between 20 and 25 degrees C (68 and 77 degrees F). Protect from light. Keep container tightly closed. Throw away any unused medicine after the expiration date. NOTE: This sheet is a summary. It may not cover all possible information. If you have questions about this medicine, talk to your doctor, pharmacist, or health care provider.  2012, [employer]vier/Gold Standard. (02/07/2008 12:01:56 PM)  3. I will see you back in 3 months  4. Prescriptions for percocet (pain medication) and ativan (lorazepam) have been given to you. Lorazepam will help you to sleep at night and help you to relax.  5. Call with any problems

## 2011-09-27 NOTE — Telephone Encounter (Signed)
gve the pt her July 2013 appt calendar °

## 2011-10-04 ENCOUNTER — Ambulatory Visit
Admission: RE | Admit: 2011-10-04 | Discharge: 2011-10-04 | Disposition: A | Discharge status: Home / Self Care | Payer: 59 | Source: Ambulatory Visit | Attending: Radiation Oncology | Admitting: Radiation Oncology

## 2011-10-04 ENCOUNTER — Encounter: Payer: Self-pay | Admitting: Radiation Oncology

## 2011-10-04 VITALS — BP 133/79 | HR 84 | Temp 97.4°F | Resp 18 | Wt 244.5 lb

## 2011-10-04 DIAGNOSIS — C50919 Malignant neoplasm of unspecified site of unspecified female breast: Secondary | ICD-10-CM

## 2011-10-04 DIAGNOSIS — D0510 Intraductal carcinoma in situ of unspecified breast: Secondary | ICD-10-CM

## 2011-10-04 NOTE — Progress Notes (Signed)
Patient presents to the clinic today accompanied by her husband for a follow up appointment with Dr. Dayton Scrape. Patient is alert and oriented to person, place, and time. No distress noted. Steady gait noted. Pleasant affect noted. Patient denies pain at this time. Patient reports that Dr. Welton Flakes prescribed Percocet and Ativan to aid in sleep. Patient reports that her breast continues to be sore and if she rolls over on it it wakes her up. Patient reports that she has been unable to sleep in her bed since 05/17/2012. Patient reports taking Tamoxifen without complications for one week now. Patient reports using Biafine still and that her skin has healed nicely. Patient reports tanning only under her treated breast. Reported all findings to Dr. Dayton Scrape   Patient scheduled to follow up with Dr. Welton Flakes on 12/23/2011.

## 2011-10-04 NOTE — Progress Notes (Signed)
Followup note:  [name] returns today approximately 1 month following completion of radiation therapy following conservative surgery in the management of her DCIS of the left breast. Her left breast pain is slowly improving. She has taken Percocet when necessary pain which is worse at night when she rolls over. The pain can wake her up. She recently started adjuvant tamoxifen one week ago under the direction of Dr. Welton Flakes. She has not yet have an appointment to see Dr. Ezzard Standing.  Physical examination: Nodes: Without palpable cervical, supraclavicular, or axillary lymphadenopathy. Chest lungs clear. Breasts there is residual hyperpigmentation along with breast with dry desquamation adjacent to the left nipple areolar complex. There is slight discomfort on palpation along the lateral left breast. There is moderate thickening of the left  breast but no discrete mass. Right breast without masses or lesions. Extremities without edema.  Impression: Satisfactory progress with improvement of her left breast pain. I expect continued improvement. She'll maintain her followup with Dr. Welton Flakes, and also Dr. Ezzard Standing.  Plan: As above. Dr. Doylene Canard Dr. Ezzard Standing may have her scheduled for bilateral mammography this November at [place]. This will serve as a baseline left breast mammogram and screening mammogram on the right. I've not scheduled the patient for a formal followup visit.

## 2011-10-28 ENCOUNTER — Telehealth: Payer: Self-pay | Admitting: *Deleted

## 2011-10-28 NOTE — Telephone Encounter (Signed)
Pt called questioned if she will need Mammogram 11/10/11 as she has this marked on her calendar. Informed opt, per Dr. Rennie Plowman note 10/04/11- pt to have Bilateral mammography this November. Pt advised she recently had one in March and thought it may be too soon for another one. Pt advised she will cancel 11/10/11 appt.

## 2011-12-23 ENCOUNTER — Ambulatory Visit: Payer: 59 | Admitting: Oncology

## 2011-12-23 ENCOUNTER — Other Ambulatory Visit: Payer: 59 | Admitting: Lab

## 2011-12-28 ENCOUNTER — Other Ambulatory Visit (HOSPITAL_BASED_OUTPATIENT_CLINIC_OR_DEPARTMENT_OTHER): Payer: 59 | Admitting: Lab

## 2011-12-28 ENCOUNTER — Encounter: Payer: Self-pay | Admitting: Oncology

## 2011-12-28 ENCOUNTER — Ambulatory Visit (HOSPITAL_BASED_OUTPATIENT_CLINIC_OR_DEPARTMENT_OTHER): Payer: 59 | Admitting: Oncology

## 2011-12-28 ENCOUNTER — Telehealth: Payer: Self-pay | Admitting: Oncology

## 2011-12-28 VITALS — BP 158/87 | HR 80 | Temp 98.6°F | Ht 65.0 in | Wt 244.4 lb

## 2011-12-28 DIAGNOSIS — D0510 Intraductal carcinoma in situ of unspecified breast: Secondary | ICD-10-CM

## 2011-12-28 DIAGNOSIS — D0590 Unspecified type of carcinoma in situ of unspecified breast: Secondary | ICD-10-CM

## 2011-12-28 LAB — CBC WITH DIFFERENTIAL/PLATELET
BASO%: 0.3 % (ref 0.0–2.0)
EOS%: 2 % (ref 0.0–7.0)
LYMPH%: 31.3 % (ref 14.0–49.7)
MCHC: 33.1 g/dL (ref 31.5–36.0)
MCV: 85.4 fL (ref 79.5–101.0)
MONO%: 7.3 % (ref 0.0–14.0)
Platelets: 213 10*3/uL (ref 145–400)
RBC: 4.83 10*6/uL (ref 3.70–5.45)

## 2011-12-28 LAB — COMPREHENSIVE METABOLIC PANEL
ALT: 15 U/L (ref 0–35)
Alkaline Phosphatase: 67 U/L (ref 39–117)
Glucose, Bld: 86 mg/dL (ref 70–99)
Sodium: 143 mEq/L (ref 135–145)
Total Bilirubin: 0.4 mg/dL (ref 0.3–1.2)
Total Protein: 6.8 g/dL (ref 6.0–8.3)

## 2011-12-28 MED ORDER — OXYCODONE HCL 5 MG PO TABS: 5.0000 mg | ORAL_TABLET | ORAL | Status: AC | PRN

## 2011-12-28 MED ORDER — VENLAFAXINE HCL ER 37.5 MG PO CP24: 37.5000 mg | ORAL_CAPSULE | Freq: Every day | ORAL | Status: DC

## 2011-12-28 NOTE — Telephone Encounter (Signed)
gve the pt her feb 2014 appt calendar °

## 2011-12-28 NOTE — Patient Instructions (Addendum)
1. OxyIR for pain use as directed  2. Effexor XR 37.5 mg daily for hot flashes  3. I will see you back in 6 months

## 2011-12-28 NOTE — Progress Notes (Signed)
OFFICE PROGRESS NOTE  CC  Thayer Headings, MD [address], [address] [place] [address] Dr. Ovidio Kin Dr. 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 Patient was originally seen in the multidisciplinary breast clinic on 07/11/2010.  PRIOR THERAPY:  #1 patient is status post left breast lumpectomy performed on 05/16/2011. The final pathology revealed a small focus of intermediate grade DCIS measuring 0.17 cm. The DCIS came close to 0.8 cm of the anterior margin. Remaining margins were free of carcinoma. The tumor was estrogen receptor +99% progesterone receptor +100%. Pathologic staging Tis NX.  #2 patient is seen by Dr. Chipper Herb for consideration of radiation therapy. She has a followup appointment with him on January 21.  #3 patient underwent genetic testing and counseling and she is negative for comprehensive analysis of BRCA1 and 2.  #4. S/P radiation therapy  #5. Tamoxifen 20 mg daily begin 09/27/11  CURRENT THERAPY:  tamoxifen 20 mg daily  INTERVAL HISTORY: Maria Bolton 66 y.o. female returns for followup visit . Her primary 2 concerns are hot flashes and right arthritic type and knee pain. She has been taking oxycodone for the pain but she is concerned about the Tylenol content. She is also taking Aleve for this. Since starting tamoxifen back in April she is experiencing hot flashes. She states that she did not have these when she underwent a change into menopause. She otherwise does have some anxiety but no nausea or vomiting no fevers no chills she does have night sweats. She has no peripheral paresthesias no vaginal bleeding no back pain no tingling or numbness. She does have some tenderness in the left breast due to the radiation and surgery. Otherwise remainder of the 10 point review of systems is negative.  MEDICAL HISTORY: Past Medical History  Diagnosis Date  . Breast cancer 04/2011    DCIS left  breast , ER/PR+  . Hypertension 05/11/2011  . Difficulty sleeping     since dx of cancer    ALLERGIES:   has no known allergies.  MEDICATIONS:  Current Outpatient Prescriptions  Medication Sig Dispense Refill  . cholecalciferol (VITAMIN D) 1000 UNITS tablet Take 1,000 Units by mouth daily.        . diflorasone-emollient (APEXICON E) 0.05 % CREA Apply topically 2 (two) times daily.        Marland Kitchen losartan (COZAAR) 100 MG tablet Take 100 mg by mouth daily.        . naproxen sodium (ANAPROX) 220 MG tablet Take 440 mg by mouth daily.      . non-metallic deodorant Thornton Papas) MISC Apply 1 application topically daily as needed.      . tamoxifen (NOLVADEX) 20 MG tablet Take 20 mg by mouth daily.      Marland Kitchen acetaminophen (TYLENOL) 500 MG tablet Take 500 mg by mouth every 6 (six) hours as needed.        . Wound Cleansers (RADIAPLEX EX) Apply topically.        SURGICAL HISTORY:  Past Surgical History  Procedure Date  . Hip surgery 1982  . Nevus excision 05/16/2011    Procedure: NEVUS EXCISION;  Surgeon: Kandis Cocking, MD;  Location: [facility];  Service: General;  Laterality: Left;  remove mole left breast  . Breast lumpectomy 05/16/2011    Procedure: LUMPECTOMY;  Surgeon: Kandis Cocking, MD;  Location: [facility];  Service: General;  Laterality: Left;  Needle localization Left breast lumpectomy  REVIEW OF SYSTEMS:  Pertinent items are noted in HPI.   PHYSICAL EXAMINATION: General appearance: alert, cooperative and appears stated age Head: Normocephalic, without obvious abnormality, atraumatic Neck: no adenopathy, no carotid bruit, no JVD, supple, symmetrical, trachea midline and thyroid not enlarged, symmetric, no tenderness/mass/nodules Lymph nodes: Cervical, supraclavicular, and axillary nodes normal. Resp: clear to auscultation bilaterally and normal percussion bilaterally Back: symmetric, no curvature. ROM normal. No CVA tenderness. Cardio: regular rate and rhythm,  S1, S2 normal, no murmur, click, rub or gallop and normal apical impulse GI: soft, non-tender; bowel sounds normal; no masses,  no organomegaly Extremities: extremities normal, atraumatic, no cyanosis or edema Neurologic: Alert and oriented X 3, normal strength and tone. Normal symmetric reflexes. Normal coordination and gait Bilateral breast examination. Left breast reveals a well-healed lumpectomy scar without any masses or skin redness. Right breast no masses or nipple discharge. ECOG PERFORMANCE STATUS: 0 - Asymptomatic  Blood pressure 158/87, pulse 80, temperature 98.6 F (37 C), temperature source Oral, height 5\' 5"  (1.651 m), weight 244 lb 6.4 oz (110.859 kg).  LABORATORY DATA: Lab Results  Component Value Date   WBC 9.2 12/28/2011   HGB 13.6 12/28/2011   HCT 41.2 12/28/2011   MCV 85.4 12/28/2011   PLT 213 12/28/2011      Chemistry      Component Value Date/Time   NA 140 09/27/2011 1026   K 4.0 09/27/2011 1026   CL 107 09/27/2011 1026   CO2 25 09/27/2011 1026   BUN 14 09/27/2011 1026   CREATININE 0.71 09/27/2011 1026      Component Value Date/Time   CALCIUM 9.2 09/27/2011 1026   ALKPHOS 74 09/27/2011 1026   AST 15 09/27/2011 1026   ALT 12 09/27/2011 1026   BILITOT 0.4 09/27/2011 1026       RADIOGRAPHIC STUDIES:  No results found.  ASSESSMENT: 66 year old female with:  1.  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 She is status post lumpectomy on 05/16/2011. The tumor was estrogen receptor +99% progesterone receptor +100%. Followed by radiation therapy to the breast. She was then started on Tamoxifen adjuvantly in April 2013.  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  #3 hot flashes secondary to tamoxifen   PLAN:  1. Patient will continue tamoxifen 20 mg daily.  #2 for her hot flashes I have prescribed Effexor XR 37.5 mg daily to see if this at least diminishes the intensity of the flushing symptoms. Prescription was sent to her pharmacy.  #3 for  pain I have given her prescription for OxyIR. If this works he certainly could ask for a prescription from her primary care or her orthopedic surgeon at some point.    #4 patient will return in 6 months time for followup and labs.  All questions were answered. The patient knows to call the clinic with any problems, questions or concerns. We can certainly see the patient much sooner if necessary.  I spent >25 minutes counseling the patient face to face. The total time spent in the appointment was 30 minutes.    Drue Second, MD Medical/Oncology [facility] [phone] (beeper) [phone] (Office)  12/28/2011, 10:37 AM

## 2012-01-18 ENCOUNTER — Encounter (INDEPENDENT_AMBULATORY_CARE_PROVIDER_SITE_OTHER): Payer: Self-pay | Admitting: Surgery

## 2012-01-18 ENCOUNTER — Ambulatory Visit (INDEPENDENT_AMBULATORY_CARE_PROVIDER_SITE_OTHER): Payer: 59 | Admitting: Surgery

## 2012-01-18 VITALS — BP 130/82 | HR 90 | Temp 97.4°F | Resp 12 | Ht 65.0 in | Wt 239.5 lb

## 2012-01-18 DIAGNOSIS — D0590 Unspecified type of carcinoma in situ of unspecified breast: Secondary | ICD-10-CM

## 2012-01-18 DIAGNOSIS — D0510 Intraductal carcinoma in situ of unspecified breast: Secondary | ICD-10-CM

## 2012-01-18 NOTE — Progress Notes (Signed)
Re:   Maria Bolton DOB:   [birth year] MRN:   [id number]  [facility]  ASSESSMENT AND PLAN: 1.  Left breast cancer, UOQ.  Post op left breast lumpectomy - 05/16/2011.  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  (preop size by MRI was considered 1.1 cm, DCIS, Tis)  ER - 99%, PR - 100%  Drs. Welton Flakes and [facility] treating oncologist.  Disease free.  On Tamoxifen - doing well with this.  She'll me back in 6 months for next office visit.  2.  Morbid obesity. BMI - 39.3. 3.  Hypertension. 4.  Seborrheic keratosis of skin - 9 o'clock left breast. 5.  Genetic tests for BRCA 1/2 - neg.  05/13/2011.  REFERRING PHYSICIAN:  Shary Decamp, MD  HISTORY OF PRESENT ILLNESS: Maria Bolton is a 66 y.o. (DOB: [birth year])  white female whose primary care physician is Shary Decamp, MD  and comes to me for follow up left breast cancer.  She is doing well.  Her husband is with her.  We talked about the mammogram report from [place] [facility].  She is tolerating Tamoxifen well.   Current Outpatient Prescriptions  Medication Sig Dispense Refill  . cholecalciferol (VITAMIN D) 1000 UNITS tablet Take 1,000 Units by mouth daily.        Marland Kitchen losartan (COZAAR) 100 MG tablet Take 100 mg by mouth daily.        . tamoxifen (NOLVADEX) 20 MG tablet Take 20 mg by mouth daily.         No Known Allergies  REVIEW OF SYSTEMS: Cardiac:  Hypertension x 10 years. No history of heart disease. Musculoskeletal:  Had hip surgery about 1983 for inflammation. GYN:  Sees Dr. Gae Gallop, but has not seen her in 2 or 3 years.  SOCIAL and FAMILY HISTORY: Accompanied with husband. She has one son, 53, who lives in [place]. Her mother had breast cancer at age 51.  PHYSICAL EXAM: BP 130/82  Pulse 90  Temp 97.4 F (36.3 C) (Temporal)  Resp 12  Ht 5\' 5"  (1.651 m)  Wt 239 lb 8 oz (108.636 kg)  BMI 39.85 kg/m2  Breasts:  Biopsy site at 3 o'clock left breast. The incision is a little nodular, but actually feels good to me.  I  removed one remaining scab.  DATA REVIEWED: She had a left breast mammogram - 01/05/2012 - they saw some complex fluid in the area of the lumpectomy.  Ovidio Kin, MD,  [facility] [address] [place]  [address]   [place], [place]    [address] Phone:  [phone] FAX:  [phone]

## 2012-07-30 ENCOUNTER — Other Ambulatory Visit (HOSPITAL_BASED_OUTPATIENT_CLINIC_OR_DEPARTMENT_OTHER): Payer: 59 | Admitting: Lab

## 2012-07-30 ENCOUNTER — Encounter: Payer: Self-pay | Admitting: Oncology

## 2012-07-30 ENCOUNTER — Ambulatory Visit (HOSPITAL_BASED_OUTPATIENT_CLINIC_OR_DEPARTMENT_OTHER): Payer: 59 | Admitting: Oncology

## 2012-07-30 ENCOUNTER — Telehealth: Payer: Self-pay | Admitting: Oncology

## 2012-07-30 VITALS — BP 153/82 | HR 90 | Temp 98.4°F | Resp 20 | Ht 65.0 in | Wt 246.2 lb

## 2012-07-30 DIAGNOSIS — F411 Generalized anxiety disorder: Secondary | ICD-10-CM

## 2012-07-30 DIAGNOSIS — G4700 Insomnia, unspecified: Secondary | ICD-10-CM

## 2012-07-30 DIAGNOSIS — D0510 Intraductal carcinoma in situ of unspecified breast: Secondary | ICD-10-CM

## 2012-07-30 DIAGNOSIS — D0590 Unspecified type of carcinoma in situ of unspecified breast: Secondary | ICD-10-CM

## 2012-07-30 DIAGNOSIS — D0512 Intraductal carcinoma in situ of left breast: Secondary | ICD-10-CM

## 2012-07-30 LAB — COMPREHENSIVE METABOLIC PANEL (CC13)
ALT: 21 U/L (ref 0–55)
BUN: 15.4 mg/dL (ref 7.0–26.0)
CO2: 27 mEq/L (ref 22–29)
Calcium: 9.9 mg/dL (ref 8.4–10.4)
Creatinine: 0.8 mg/dL (ref 0.6–1.1)
Total Bilirubin: 0.38 mg/dL (ref 0.20–1.20)

## 2012-07-30 LAB — CBC WITH DIFFERENTIAL/PLATELET
Eosinophils Absolute: 0.3 10*3/uL (ref 0.0–0.5)
HCT: 41 % (ref 34.8–46.6)
LYMPH%: 35.7 % (ref 14.0–49.7)
MCHC: 33.7 g/dL (ref 31.5–36.0)
MCV: 83.8 fL (ref 79.5–101.0)
MONO%: 7 % (ref 0.0–14.0)
NEUT#: 4.5 10*3/uL (ref 1.5–6.5)
NEUT%: 53.5 % (ref 38.4–76.8)
Platelets: 212 10*3/uL (ref 145–400)
RBC: 4.89 10*6/uL (ref 3.70–5.45)

## 2012-07-30 NOTE — Telephone Encounter (Signed)
gv pt appt schedule for August.  °

## 2012-07-30 NOTE — Patient Instructions (Addendum)
Continue tamoxifen 20 mg daily  I will see you back in 6 months  Recommend exercise and eating healthy and have a good bedtime routine

## 2012-07-30 NOTE — Progress Notes (Signed)
OFFICE PROGRESS NOTE  CC  Thayer Headings, MD [address] [place] [address] Dr. Ovidio Kin Dr. Chipper Herb  DIAGNOSIS: 67 year old female with DCIS of the left breast diagnosed 05/03/2011. Patient was originally seen in the multidisciplinary breast clinic on 07/11/2010.  PRIOR THERAPY:  #1 patient is status post left breast lumpectomy performed on 05/16/2011. The final pathology revealed a small focus of intermediate grade DCIS measuring 0.17 cm. The DCIS came close to 0.8 cm of the anterior margin. Remaining margins were free of carcinoma. The tumor was estrogen receptor +99% progesterone receptor +100%. Pathologic staging Tis NX.  #2 patient is seen by Dr. Chipper Herb for consideration of radiation therapy. She has a followup appointment with him on January 21.  #3 patient underwent genetic testing and counseling and she is negative for comprehensive analysis of BRCA1 and 2.  #4. S/P radiation therapy  #5. Tamoxifen 20 mg daily begin 09/27/11  CURRENT THERAPY:  tamoxifen 20 mg daily  INTERVAL HISTORY: Maria Bolton 67 y.o. female returns for followup visit .overall she is doing well tolerating tamoxifen well with only hot flashes. Her husband informs me that she is having significant problems with sleeping. Patient declined taking any kind of a sleeping aid. We discussed this extensively today. She does of course suffer from great deal of anxiety I would discuss this as well. She otherwise has no nausea vomiting fevers chills night sweats no headaches double vision or blurring of vision she has not noticed any vaginal bleeding or discharge. No lower extremity swelling. Remainder of the 10 point review of systems is negative. MEDICAL HISTORY: Past Medical History  Diagnosis Date  . Breast cancer 04/2011    DCIS left breast , ER/PR+  . Hypertension 05/11/2011  . Difficulty sleeping     since dx of cancer    ALLERGIES:  has No Known  Allergies.  MEDICATIONS:  Current Outpatient Prescriptions  Medication Sig Dispense Refill  . cholecalciferol (VITAMIN D) 1000 UNITS tablet Take 1,000 Units by mouth daily.        Marland Kitchen losartan (COZAAR) 100 MG tablet Take 100 mg by mouth daily.        . tamoxifen (NOLVADEX) 20 MG tablet Take 20 mg by mouth daily.       No current facility-administered medications for this visit.    SURGICAL HISTORY:  Past Surgical History  Procedure Laterality Date  . Hip surgery  1982  . Nevus excision  05/16/2011    Procedure: NEVUS EXCISION;  Surgeon: Kandis Cocking, MD;  Location: [facility];  Service: General;  Laterality: Left;  remove mole left breast  . Breast lumpectomy  05/16/2011    Procedure: LUMPECTOMY;  Surgeon: Kandis Cocking, MD;  Location: [facility];  Service: General;  Laterality: Left;  Needle localization Left breast lumpectomy    REVIEW OF SYSTEMS:  Pertinent items are noted in HPI.   PHYSICAL EXAMINATION: General appearance: alert, cooperative and appears stated age Head: Normocephalic, without obvious abnormality, atraumatic Neck: no adenopathy, no carotid bruit, no JVD, supple, symmetrical, trachea midline and thyroid not enlarged, symmetric, no tenderness/mass/nodules Lymph nodes: Cervical, supraclavicular, and axillary nodes normal. Resp: clear to auscultation bilaterally and normal percussion bilaterally Back: symmetric, no curvature. ROM normal. No CVA tenderness. Cardio: regular rate and rhythm, S1, S2 normal, no murmur, click, rub or gallop and normal apical impulse GI: soft, non-tender; bowel sounds normal; no masses,  no organomegaly Extremities: extremities normal, atraumatic, no cyanosis or edema  Neurologic: Alert and oriented X 3, normal strength and tone. Normal symmetric reflexes. Normal coordination and gait Bilateral breast examination. Left breast reveals a well-healed lumpectomy scar without any masses or skin redness. Right  breast no masses or nipple discharge. ECOG PERFORMANCE STATUS: 0 - Asymptomatic  Blood pressure 153/82, pulse 90, temperature 98.4 F (36.9 C), temperature source Oral, resp. rate 20, height 5\' 5"  (1.651 m), weight 246 lb 4 oz (111.698 kg).  LABORATORY DATA: Lab Results  Component Value Date   WBC 8.4 07/30/2012   HGB 13.8 07/30/2012   HCT 41.0 07/30/2012   MCV 83.8 07/30/2012   PLT 212 07/30/2012      Chemistry      Component Value Date/Time   NA 144 07/30/2012 0824   NA 143 12/28/2011 1020   K 3.8 07/30/2012 0824   K 3.9 12/28/2011 1020   CL 109* 07/30/2012 0824   CL 107 12/28/2011 1020   CO2 27 07/30/2012 0824   CO2 27 12/28/2011 1020   BUN 15.4 07/30/2012 0824   BUN 17 12/28/2011 1020   CREATININE 0.8 07/30/2012 0824   CREATININE 0.71 12/28/2011 1020      Component Value Date/Time   CALCIUM 9.9 07/30/2012 0824   CALCIUM 9.2 12/28/2011 1020   ALKPHOS 72 07/30/2012 0824   ALKPHOS 67 12/28/2011 1020   AST 20 07/30/2012 0824   AST 16 12/28/2011 1020   ALT 21 07/30/2012 0824   ALT 15 12/28/2011 1020   BILITOT 0.38 07/30/2012 0824   BILITOT 0.4 12/28/2011 1020       RADIOGRAPHIC STUDIES:  No results found.  ASSESSMENT: 67 year old female with:  1.  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 She is status post lumpectomy on 05/16/2011. The tumor was estrogen receptor +99% progesterone receptor +100%. Followed by radiation therapy to the breast. She was then started on Tamoxifen adjuvantly in April 2013.  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  #3 hot flashes secondary to tamoxifen  #4 insomnia and anxiety   PLAN:  1. Patient will continue tamoxifen 20 mg daily.  #2 we discussed that time routines as well as exercise and possibly doing yoga. Her to I told her about her yoga classes that are given here free of charge.   #3 patient will return in 6 months time for followup and labs.  All questions were answered. The patient knows to call the clinic with any  problems, questions or concerns. We can certainly see the patient much sooner if necessary.  I spent >25 minutes counseling the patient face to face. The total time spent in the appointment was 30 minutes.    Drue Second, MD Medical/Oncology [facility] [phone] (beeper) [phone] (Office)  07/30/2012, 9:19 AM

## 2012-07-31 LAB — VITAMIN D 25 HYDROXY (VIT D DEFICIENCY, FRACTURES): Vit D, 25-Hydroxy: 43 ng/mL (ref 30–89)

## 2012-08-01 ENCOUNTER — Encounter (INDEPENDENT_AMBULATORY_CARE_PROVIDER_SITE_OTHER): Payer: Self-pay | Admitting: Surgery

## 2012-08-01 ENCOUNTER — Ambulatory Visit (INDEPENDENT_AMBULATORY_CARE_PROVIDER_SITE_OTHER): Payer: 59 | Admitting: Surgery

## 2012-08-01 VITALS — BP 128/80 | HR 96 | Temp 98.6°F | Resp 18 | Ht 65.0 in | Wt 245.0 lb

## 2012-08-01 DIAGNOSIS — D0590 Unspecified type of carcinoma in situ of unspecified breast: Secondary | ICD-10-CM

## 2012-08-01 NOTE — Progress Notes (Signed)
Re:   Maria Bolton DOB:   [birth year] MRN:   [id number]  [facility]  ASSESSMENT AND PLAN: 1.  Left breast cancer, UOQ.  Post op left breast lumpectomy - 05/16/2011.  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  (preop size by MRI was considered 1.1 cm, DCIS, Tis)  ER - 99%, PR - 100%  Drs. Welton Flakes and [facility] treating oncologist.  Disease free.  Has generalized left breast discomfort.  On Tamoxifen - doing well with this.  She'll me back in 6 months for next office visit.  2.  Morbid obesity. BMI - 39.3. 3.  Hypertension. 4.  Seborrheic keratosis of skin - 9 o'clock left breast. 5.  Genetic tests for BRCA 1/2 - neg.  05/13/2011. 6.  Having hot flashes. 7.  Not sleep well.  Dr. Welton Flakes had suggested Benadryl.  REFERRING PHYSICIAN:  Shary Decamp, MD  HISTORY OF PRESENT ILLNESS: Maria Bolton is a 67 y.o. (DOB: 10-16-1945)  white female whose primary care physician is Shary Decamp, MD  and comes to me for follow up left breast cancer.  She is doing well.  Her husband is with her.  She is tolerating Tamoxifen well, except for hot flashes.  She still has a fair amount of soreness in the left breast.  This wakes her up at night.  I talked about trying to wear a sports bra to see if that helps.  She asked about a baby aspirin.  She has no heart disease risks, except for obesity.  I told her to discuss this with her PCP.  We talked about her insomnia, but I think she could exercise more.   Current Outpatient Prescriptions  Medication Sig Dispense Refill  . cholecalciferol (VITAMIN D) 1000 UNITS tablet Take 1,000 Units by mouth daily.        Marland Kitchen losartan (COZAAR) 100 MG tablet Take 100 mg by mouth daily.        . tamoxifen (NOLVADEX) 20 MG tablet Take 20 mg by mouth daily.      Marland Kitchen ibuprofen (ADVIL,MOTRIN) 800 MG tablet        No current facility-administered medications for this visit.     No Known Allergies  REVIEW OF SYSTEMS: Cardiac:  Hypertension x 10 years. No history of heart  disease. Musculoskeletal:  Had hip surgery about 1983 for inflammation. GYN:  Sees Dr. Gae Gallop, but has not seen her in 2 or 3 years.  SOCIAL and FAMILY HISTORY: Accompanied with husband. She has one son, 40, who lives in [place]. Her mother had breast cancer at age 16.  PHYSICAL EXAM: BP 128/80  Pulse 96  Temp(Src) 98.6 F (37 C)  Resp 18  Ht 5\' 5"  (1.651 m)  Wt 245 lb (111.131 kg)  BMI 40.77 kg/m2  HEENT:  Pupils equal.  Dentition good. NECK:  Supple.  No thyroid mass. LYMPH NODES:  No cervical, supraclavicular, or axillary adenopathy. BREASTS -  RIGHT:  No palpable mass or nodule.  No nipple discharge.   LEFT:  Biopsy site at 3 o'clock left breast. The incision is a little nodular, but actually feels good to me. She has tenderness of the entire left breast, but no focal abnormality. UPPER EXTREMITIES:  No evidence of lymphedema.  DATA REVIEWED: Bilateral mammograms, [facility]- 04/26/2012 - repeat films in one year  Ovidio Kin, MD,  [facility], [place] [address]  [address]   [place], [place]    [address] Phone:  [phone] FAX:  [phone]  

## 2012-09-10 ENCOUNTER — Telehealth: Payer: Self-pay | Admitting: Medical Oncology

## 2012-09-10 NOTE — Telephone Encounter (Signed)
Letter for massage therapy placed in mail for patient, per pt's request.

## 2012-09-28 ENCOUNTER — Other Ambulatory Visit: Payer: Self-pay | Admitting: *Deleted

## 2012-09-28 DIAGNOSIS — C50919 Malignant neoplasm of unspecified site of unspecified female breast: Secondary | ICD-10-CM

## 2012-09-28 MED ORDER — TAMOXIFEN CITRATE 20 MG PO TABS: 20.0000 mg | ORAL_TABLET | Freq: Every day | ORAL | Status: DC

## 2013-01-21 ENCOUNTER — Other Ambulatory Visit: Payer: Self-pay | Admitting: *Deleted

## 2013-01-21 DIAGNOSIS — D0592 Unspecified type of carcinoma in situ of left breast: Secondary | ICD-10-CM

## 2013-01-21 DIAGNOSIS — C50919 Malignant neoplasm of unspecified site of unspecified female breast: Secondary | ICD-10-CM

## 2013-01-21 MED ORDER — TAMOXIFEN CITRATE 20 MG PO TABS: 20.0000 mg | ORAL_TABLET | Freq: Every day | ORAL | Status: DC

## 2013-01-28 ENCOUNTER — Ambulatory Visit (HOSPITAL_BASED_OUTPATIENT_CLINIC_OR_DEPARTMENT_OTHER): Payer: Medicare Other | Admitting: Oncology

## 2013-01-28 ENCOUNTER — Other Ambulatory Visit: Payer: Self-pay | Admitting: Emergency Medicine

## 2013-01-28 ENCOUNTER — Other Ambulatory Visit (HOSPITAL_BASED_OUTPATIENT_CLINIC_OR_DEPARTMENT_OTHER): Payer: Medicare Other | Admitting: Lab

## 2013-01-28 ENCOUNTER — Encounter: Payer: Self-pay | Admitting: Oncology

## 2013-01-28 VITALS — BP 156/78 | HR 86 | Temp 98.8°F | Resp 20 | Ht 65.0 in | Wt 244.0 lb

## 2013-01-28 DIAGNOSIS — D0512 Intraductal carcinoma in situ of left breast: Secondary | ICD-10-CM

## 2013-01-28 DIAGNOSIS — F411 Generalized anxiety disorder: Secondary | ICD-10-CM

## 2013-01-28 DIAGNOSIS — D0592 Unspecified type of carcinoma in situ of left breast: Secondary | ICD-10-CM

## 2013-01-28 DIAGNOSIS — G4700 Insomnia, unspecified: Secondary | ICD-10-CM

## 2013-01-28 DIAGNOSIS — Z170 Estrogen receptor positive status [ER+]: Secondary | ICD-10-CM

## 2013-01-28 DIAGNOSIS — D0590 Unspecified type of carcinoma in situ of unspecified breast: Secondary | ICD-10-CM

## 2013-01-28 DIAGNOSIS — C50919 Malignant neoplasm of unspecified site of unspecified female breast: Secondary | ICD-10-CM

## 2013-01-28 LAB — CBC WITH DIFFERENTIAL/PLATELET
Basophils Absolute: 0.1 10*3/uL (ref 0.0–0.1)
Eosinophils Absolute: 0.3 10*3/uL (ref 0.0–0.5)
HGB: 13.4 g/dL (ref 11.6–15.9)
LYMPH%: 33.4 % (ref 14.0–49.7)
MCH: 28.8 pg (ref 25.1–34.0)
MCV: 85.1 fL (ref 79.5–101.0)
MONO%: 6.3 % (ref 0.0–14.0)
NEUT#: 4.9 10*3/uL (ref 1.5–6.5)
Platelets: 212 10*3/uL (ref 145–400)

## 2013-01-28 LAB — COMPREHENSIVE METABOLIC PANEL (CC13)
Albumin: 3.1 g/dL — ABNORMAL LOW (ref 3.5–5.0)
Alkaline Phosphatase: 65 U/L (ref 40–150)
BUN: 14.4 mg/dL (ref 7.0–26.0)
Creatinine: 0.8 mg/dL (ref 0.6–1.1)
Glucose: 111 mg/dl (ref 70–140)
Total Bilirubin: 0.38 mg/dL (ref 0.20–1.20)

## 2013-01-28 MED ORDER — TAMOXIFEN CITRATE 20 MG PO TABS: 20.0000 mg | ORAL_TABLET | Freq: Every day | ORAL | Status: DC

## 2013-01-28 NOTE — Telephone Encounter (Signed)
appts made and printed...td 

## 2013-01-28 NOTE — Patient Instructions (Addendum)
Overall you are doing well. There is no evidence of recurrent cancer.  Continue taking tamoxifen 20 mg daily.  We discussed exercise to help reduce stress and hot flashes.  I will see you back in 6 months time for follow

## 2013-01-28 NOTE — Progress Notes (Signed)
OFFICE PROGRESS NOTE  CC  Thayer Headings, MD [address], [address] [place] [address] Dr. Ovidio Kin Dr. Chipper Herb  DIAGNOSIS: 67 year old female with DCIS of the left breast diagnosed 05/03/2011. Patient was originally seen in the multidisciplinary breast clinic on 07/11/2010.  PRIOR THERAPY:  #1 patient is status post left breast lumpectomy performed on 05/16/2011. The final pathology revealed a small focus of intermediate grade DCIS measuring 0.17 cm. The DCIS came close to 0.8 cm of the anterior margin. Remaining margins were free of carcinoma. The tumor was estrogen receptor +99% progesterone receptor +100%. Pathologic staging Tis NX.  #2 patient is seen by Dr. Chipper Herb for consideration of radiation therapy. She has a followup appointment with him on January 21.  #3 patient underwent genetic testing and counseling and she is negative for comprehensive analysis of BRCA1 and 2.  #4. S/P radiation therapy  #5. Tamoxifen 20 mg daily begin 09/27/11  CURRENT THERAPY:  tamoxifen 20 mg daily  INTERVAL HISTORY: Maria Bolton 67 y.o. female returns for followup visit .overall she is doing well tolerating tamoxifen well with only hot flashes.She does of course suffer from great deal of anxiety I would discuss this as well. She otherwise has no nausea vomiting fevers chills night sweats no headaches double vision or blurring of vision she has not noticed any vaginal bleeding or discharge. No lower extremity swelling. Remainder of the 10 point review of systems is negative. MEDICAL HISTORY: Past Medical History  Diagnosis Date  . Breast cancer 04/2011    DCIS left breast , ER/PR+  . Hypertension 05/11/2011  . Difficulty sleeping     since dx of cancer    ALLERGIES:  has No Known Allergies.  MEDICATIONS:  Current Outpatient Prescriptions  Medication Sig Dispense Refill  . cholecalciferol (VITAMIN D) 1000 UNITS tablet Take 1,000 Units by mouth daily.        Marland Kitchen  ibuprofen (ADVIL,MOTRIN) 800 MG tablet       . losartan (COZAAR) 100 MG tablet Take 100 mg by mouth daily.        . tamoxifen (NOLVADEX) 20 MG tablet Take 1 tablet (20 mg total) by mouth daily.  30 tablet  12   No current facility-administered medications for this visit.    SURGICAL HISTORY:  Past Surgical History  Procedure Laterality Date  . Hip surgery  1982  . Nevus excision  05/16/2011    Procedure: NEVUS EXCISION;  Surgeon: Kandis Cocking, MD;  Location: [facility];  Service: General;  Laterality: Left;  remove mole left breast  . Breast lumpectomy  05/16/2011    Procedure: LUMPECTOMY;  Surgeon: Kandis Cocking, MD;  Location: [facility];  Service: General;  Laterality: Left;  Needle localization Left breast lumpectomy    REVIEW OF SYSTEMS:  Pertinent items are noted in HPI.   PHYSICAL EXAMINATION: General appearance: alert, cooperative and appears stated age Head: Normocephalic, without obvious abnormality, atraumatic Neck: no adenopathy, no carotid bruit, no JVD, supple, symmetrical, trachea midline and thyroid not enlarged, symmetric, no tenderness/mass/nodules Lymph nodes: Cervical, supraclavicular, and axillary nodes normal. Resp: clear to auscultation bilaterally and normal percussion bilaterally Back: symmetric, no curvature. ROM normal. No CVA tenderness. Cardio: regular rate and rhythm, S1, S2 normal, no murmur, click, rub or gallop and normal apical impulse GI: soft, non-tender; bowel sounds normal; no masses,  no organomegaly Extremities: extremities normal, atraumatic, no cyanosis or edema Neurologic: Alert and oriented X 3, normal strength and tone. Normal  symmetric reflexes. Normal coordination and gait Bilateral breast examination. Left breast reveals a well-healed lumpectomy scar without any masses or skin redness. Right breast no masses or nipple discharge. ECOG PERFORMANCE STATUS: 0 - Asymptomatic  Blood pressure 156/78, pulse 86,  temperature 98.8 F (37.1 C), temperature source Oral, resp. rate 20, height 5\' 5"  (1.651 m), weight 244 lb (110.678 kg).  LABORATORY DATA: Lab Results  Component Value Date   WBC 8.6 01/28/2013   HGB 13.4 01/28/2013   HCT 39.6 01/28/2013   MCV 85.1 01/28/2013   PLT 212 01/28/2013      Chemistry      Component Value Date/Time   NA 145 01/28/2013 0857   NA 143 12/28/2011 1020   K 4.0 01/28/2013 0857   K 3.9 12/28/2011 1020   CL 109* 07/30/2012 0824   CL 107 12/28/2011 1020   CO2 23 01/28/2013 0857   CO2 27 12/28/2011 1020   BUN 14.4 01/28/2013 0857   BUN 17 12/28/2011 1020   CREATININE 0.8 01/28/2013 0857   CREATININE 0.71 12/28/2011 1020      Component Value Date/Time   CALCIUM 9.1 01/28/2013 0857   CALCIUM 9.2 12/28/2011 1020   ALKPHOS 65 01/28/2013 0857   ALKPHOS 67 12/28/2011 1020   AST 20 01/28/2013 0857   AST 16 12/28/2011 1020   ALT 20 01/28/2013 0857   ALT 15 12/28/2011 1020   BILITOT 0.38 01/28/2013 0857   BILITOT 0.4 12/28/2011 1020       RADIOGRAPHIC STUDIES:  No results found.  ASSESSMENT: 67 year old female with:  1.  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 She is status post lumpectomy on 05/16/2011. The tumor was estrogen receptor +99% progesterone receptor +100%. Followed by radiation therapy to the breast. She was then started on Tamoxifen adjuvantly in April 2013.  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  #3 hot flashes secondary to tamoxifen  #4 insomnia and anxiety   PLAN:  1. Patient will continue tamoxifen 20 mg daily.  #2 we discussed that time routines as well as exercise and possibly doing yoga.    #3 patient will return in 6 months time for followup and labs.  All questions were answered. The patient knows to call the clinic with any problems, questions or concerns. We can certainly see the patient much sooner if necessary.  I spent >25 minutes counseling the patient face to face. The total time spent in the appointment was 30  minutes.    Drue Second, MD Medical/Oncology [facility] [phone] (beeper) [phone] (Office)  01/28/2013, 9:49 AM

## 2013-02-27 ENCOUNTER — Encounter (INDEPENDENT_AMBULATORY_CARE_PROVIDER_SITE_OTHER): Payer: Self-pay | Admitting: Surgery

## 2013-02-27 ENCOUNTER — Ambulatory Visit (INDEPENDENT_AMBULATORY_CARE_PROVIDER_SITE_OTHER): Payer: Medicare Other | Admitting: Surgery

## 2013-02-27 VITALS — BP 160/102 | HR 92 | Temp 98.0°F | Resp 15 | Ht 65.0 in | Wt 246.0 lb

## 2013-02-27 DIAGNOSIS — D0590 Unspecified type of carcinoma in situ of unspecified breast: Secondary | ICD-10-CM

## 2013-02-27 DIAGNOSIS — D0512 Intraductal carcinoma in situ of left breast: Secondary | ICD-10-CM

## 2013-02-27 NOTE — Progress Notes (Signed)
Re:   Maria Bolton DOB:   Dec 28, 1945 MRN:   [id number]  [facility]  ASSESSMENT AND PLAN: 1.  Left breast cancer, UOQ. (Tis, N0)  Post op left breast lumpectomy - 05/16/2011.  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  (preop size by MRI was considered 1.1 cm)  ER - 99%, PR - 100%  Drs. Welton Flakes and [facility] treating oncologist.  Disease free.  Has generalized left breast discomfort.  On Tamoxifen - doing well with this except hot flashes.  We are now almost 2 years out from her breast cancer.  We can go with alternating visits between Dr. Welton Flakes and myself.  I'll see her back in one year, with the assumption that Dr. Welton Flakes will see her in the 6 months in between.  2.  Morbid obesity. BMI - 39.3. 3.  Hypertension. 4.  Seborrheic keratosis of skin - 9 o'clock left breast.  This looks like it is gone. 5.  Genetic tests for BRCA 1/2 - neg.  05/13/2011. 6.  Having hot flashes. 7.  Not sleep well.  Dr. Welton Flakes had suggested Benadryl.  REFERRING PHYSICIAN:  Shary Decamp, MD  HISTORY OF PRESENT ILLNESS: Maria Bolton is a 67 y.o. (DOB: 20-Jul-1945)  white female whose primary care physician is Shary Decamp, MD  and comes to me for follow up left breast cancer.  She is doing well.  Her husband is with her.  She is tolerating Tamoxifen well, except for hot flashes.  The soreness in her left breast is better.  She has found no new abnormality.   Current Outpatient Prescriptions  Medication Sig Dispense Refill  . cholecalciferol (VITAMIN D) 1000 UNITS tablet Take 1,000 Units by mouth daily.        Marland Kitchen ibuprofen (ADVIL,MOTRIN) 800 MG tablet       . losartan (COZAAR) 100 MG tablet Take 100 mg by mouth daily.        . tamoxifen (NOLVADEX) 20 MG tablet Take 1 tablet (20 mg total) by mouth daily.  30 tablet  12   No current facility-administered medications for this visit.     No Known Allergies  REVIEW OF SYSTEMS: Cardiac:  Hypertension x 10 years. No history of heart  disease. Musculoskeletal:  Had hip surgery about 1983 for inflammation. GYN:  Sees Dr. Gae Gallop, but has not seen her in 2 or 3 years.  SOCIAL and FAMILY HISTORY: Accompanied with husband. She has one son, 61, who lives in [place]. Her mother had breast cancer at age 31.  PHYSICAL EXAM: BP 160/102  Pulse 92  Temp(Src) 98 F (36.7 C) (Temporal)  Resp 15  Ht 5\' 5"  (1.651 m)  Wt 246 lb (111.585 kg)  BMI 40.94 kg/m2  GENERAL:  WN WF.  No acute issues.  A little anxious. HEENT:  Pupils equal.  Dentition good. NECK:  Supple.  No thyroid mass. LYMPH NODES:  No cervical, supraclavicular, or axillary adenopathy. BREASTS -  RIGHT:  No palpable mass or nodule.  No nipple discharge.   LEFT:  Biopsy site at 3 o'clock left breast. The incision is a little nodular, but actually feels good to me. She has tenderness of the entire left breast, but no focal abnormality. UPPER EXTREMITIES:  No evidence of lymphedema.  DATA REVIEWED: Bilateral mammograms, [facility]- 04/26/2012 - repeat films for this November.  Ovidio Kin, MD,  [facility] [address] [place]  [address]   [place], [place]    [address] Phone:  [place]

## 2013-06-05 ENCOUNTER — Telehealth: Payer: Self-pay | Admitting: Oncology

## 2013-06-05 NOTE — Telephone Encounter (Signed)
, °

## 2013-08-01 ENCOUNTER — Other Ambulatory Visit: Payer: 59

## 2013-08-01 ENCOUNTER — Ambulatory Visit: Payer: 59 | Admitting: Oncology

## 2013-08-07 ENCOUNTER — Ambulatory Visit (HOSPITAL_BASED_OUTPATIENT_CLINIC_OR_DEPARTMENT_OTHER): Payer: Medicare Other | Admitting: Oncology

## 2013-08-07 ENCOUNTER — Other Ambulatory Visit (HOSPITAL_BASED_OUTPATIENT_CLINIC_OR_DEPARTMENT_OTHER): Payer: Medicare Other

## 2013-08-07 ENCOUNTER — Telehealth: Payer: Self-pay | Admitting: Oncology

## 2013-08-07 ENCOUNTER — Encounter: Payer: Self-pay | Admitting: Oncology

## 2013-08-07 VITALS — BP 134/83 | HR 81 | Temp 97.9°F | Resp 18 | Ht 65.0 in | Wt 233.8 lb

## 2013-08-07 DIAGNOSIS — D0510 Intraductal carcinoma in situ of unspecified breast: Secondary | ICD-10-CM

## 2013-08-07 DIAGNOSIS — D0512 Intraductal carcinoma in situ of left breast: Secondary | ICD-10-CM

## 2013-08-07 DIAGNOSIS — D0590 Unspecified type of carcinoma in situ of unspecified breast: Secondary | ICD-10-CM

## 2013-08-07 DIAGNOSIS — Z170 Estrogen receptor positive status [ER+]: Secondary | ICD-10-CM

## 2013-08-07 DIAGNOSIS — R61 Generalized hyperhidrosis: Secondary | ICD-10-CM

## 2013-08-07 DIAGNOSIS — G4700 Insomnia, unspecified: Secondary | ICD-10-CM

## 2013-08-07 DIAGNOSIS — F411 Generalized anxiety disorder: Secondary | ICD-10-CM

## 2013-08-07 LAB — COMPREHENSIVE METABOLIC PANEL (CC13)
ALT: 14 U/L (ref 0–55)
ANION GAP: 8 meq/L (ref 3–11)
AST: 16 U/L (ref 5–34)
Albumin: 3.2 g/dL — ABNORMAL LOW (ref 3.5–5.0)
Alkaline Phosphatase: 69 U/L (ref 40–150)
BILIRUBIN TOTAL: 0.31 mg/dL (ref 0.20–1.20)
BUN: 14.5 mg/dL (ref 7.0–26.0)
CO2: 26 meq/L (ref 22–29)
Calcium: 9.3 mg/dL (ref 8.4–10.4)
Chloride: 111 mEq/L — ABNORMAL HIGH (ref 98–109)
Creatinine: 0.7 mg/dL (ref 0.6–1.1)
GLUCOSE: 95 mg/dL (ref 70–140)
Potassium: 3.9 mEq/L (ref 3.5–5.1)
Sodium: 145 mEq/L (ref 136–145)
Total Protein: 7 g/dL (ref 6.4–8.3)

## 2013-08-07 LAB — CBC WITH DIFFERENTIAL/PLATELET
BASO%: 0.6 % (ref 0.0–2.0)
Basophils Absolute: 0.1 10*3/uL (ref 0.0–0.1)
EOS ABS: 0.3 10*3/uL (ref 0.0–0.5)
EOS%: 2.9 % (ref 0.0–7.0)
HEMATOCRIT: 41.5 % (ref 34.8–46.6)
HGB: 13.7 g/dL (ref 11.6–15.9)
LYMPH%: 35 % (ref 14.0–49.7)
MCH: 28.3 pg (ref 25.1–34.0)
MCHC: 33.1 g/dL (ref 31.5–36.0)
MCV: 85.6 fL (ref 79.5–101.0)
MONO#: 0.7 10*3/uL (ref 0.1–0.9)
MONO%: 7 % (ref 0.0–14.0)
NEUT#: 5.3 10*3/uL (ref 1.5–6.5)
NEUT%: 54.5 % (ref 38.4–76.8)
Platelets: 209 10*3/uL (ref 145–400)
RBC: 4.85 10*6/uL (ref 3.70–5.45)
RDW: 14.6 % — ABNORMAL HIGH (ref 11.2–14.5)
WBC: 9.7 10*3/uL (ref 3.9–10.3)
lymph#: 3.4 10*3/uL — ABNORMAL HIGH (ref 0.9–3.3)

## 2013-08-07 MED ORDER — TAMOXIFEN CITRATE 20 MG PO TABS: 20.0000 mg | ORAL_TABLET | Freq: Every day | ORAL | Status: DC

## 2013-08-07 MED ORDER — VENLAFAXINE HCL ER 37.5 MG PO CP24: 37.5000 mg | ORAL_CAPSULE | Freq: Every day | ORAL | Status: DC

## 2013-08-07 NOTE — Telephone Encounter (Signed)
, °

## 2013-08-07 NOTE — Patient Instructions (Addendum)
Venlafaxine extended-release capsules What is this medicine? VENLAFAXINE(VEN la fax een) is used to treat depression, anxiety and panic disorder. This medicine may be used for other purposes; ask your health care provider or pharmacist if you have questions. COMMON BRAND NAME(S): Effexor XR What should I tell my health care provider before I take this medicine? They need to know if you have any of these conditions: -bleeding disorders -glaucoma -heart disease -high blood pressure -high cholesterol -kidney disease -liver disease -low levels of sodium in the blood -mania or bipolar disorder -seizures -suicidal thoughts, plans, or attempt; a previous suicide attempt by you or a family -take medicines that treat or prevent blood clots -thyroid disease -an unusual or allergic reaction to venlafaxine, desvenlafaxine, other medicines, foods, dyes, or preservatives -pregnant or trying to get pregnant -breast-feeding How should I use this medicine? Take this medicine by mouth with a full glass of water. Follow the directions on the prescription label. Do not cut, crush, or chew this medicine. Take it with food. If needed, the capsule may be carefully opened and the entire contents sprinkled on a spoonful of cool applesauce. Swallow the applesauce/pellet mixture right away without chewing and follow with a glass of water to ensure complete swallowing of the pellets. Try to take your medicine at about the same time each day. Do not take your medicine more often than directed. Do not stop taking this medicine suddenly except upon the advice of your doctor. Stopping this medicine too quickly may cause serious side effects or your condition may worsen. A special MedGuide will be given to you by the pharmacist with each prescription and refill. Be sure to read this information carefully each time. Talk to your pediatrician regarding the use of this medicine in children. Special care may be  needed. Overdosage: If you think you have taken too much of this medicine contact a poison control center or emergency room at once. NOTE: This medicine is only for you. Do not share this medicine with others. What if I miss a dose? If you miss a dose, take it as soon as you can. If it is almost time for your next dose, take only that dose. Do not take double or extra doses. What may interact with this medicine? Do not take this medicine with any of the following medications: -certain medicines for fungal infections like fluconazole, itraconazole, ketoconazole, posaconazole, voriconazole -cisapride -desvenlafaxine -dofetilide -dronedarone -duloxetine -levomilnacipran -linezolid -MAOIs like Carbex, Eldepryl, Marplan, Nardil, and Parnate -methylene blue (injected into a vein) -milnacipran -pimozide -thioridazine -ziprasidone  This medicine may also interact with the following medications: -aspirin and aspirin-like medicines -certain medicines for depression, anxiety, or psychotic disturbances -certain medicines for migraine headaches like almotriptan, eletriptan, frovatriptan, naratriptan, rizatriptan, sumatriptan, zolmitriptan -certain medicines for sleep -certain medicines that treat or prevent blood clots like dalteparin, enoxaparin, warfarin -cimetidine -clozapine -diuretics -fentanyl -furazolidone -indinavir -isoniazid -lithium -metoprolol -NSAIDS, medicines for pain and inflammation, like ibuprofen or naproxen -other medicines that prolong the QT interval (cause an abnormal heart rhythm) -procarbazine -rasagiline -supplements like St. John's wort, kava kava, valerian -tramadol -tryptophan This list may not describe all possible interactions. Give your health care provider a list of all the medicines, herbs, non-prescription drugs, or dietary supplements you use. Also tell them if you smoke, drink alcohol, or use illegal drugs. Some items may interact with your  medicine. What should I watch for while using this medicine? Tell your doctor if your symptoms do not get better or if they get  worse. Visit your doctor or health care professional for regular checks on your progress. Because it may take several weeks to see the full effects of this medicine, it is important to continue your treatment as prescribed by your doctor. Patients and their families should watch out for new or worsening thoughts of suicide or depression. Also watch out for sudden changes in feelings such as feeling anxious, agitated, panicky, irritable, hostile, aggressive, impulsive, severely restless, overly excited and hyperactive, or not being able to sleep. If this happens, especially at the beginning of treatment or after a change in dose, call your health care professional. This medicine can cause an increase in blood pressure. Check with your doctor for instructions on monitoring your blood pressure while taking this medicine. You may get drowsy or dizzy. Do not drive, use machinery, or do anything that needs mental alertness until you know how this medicine affects you. Do not stand or sit up quickly, especially if you are an older patient. This reduces the risk of dizzy or fainting spells. Alcohol may interfere with the effect of this medicine. Avoid alcoholic drinks. Your mouth may get dry. Chewing sugarless gum, sucking hard candy and drinking plenty of water will help. Contact your doctor if the problem does not go away or is severe. What side effects may I notice from receiving this medicine? Side effects that you should report to your doctor or health care professional as soon as possible: -allergic reactions like skin rash, itching or hives, swelling of the face, lips, or tongue -breathing problems -changes in vision -hallucination, loss of contact with reality -seizures -suicidal thoughts or other mood changes -trouble passing urine or change in the amount of urine -unusual  bleeding or bruising Side effects that usually do not require medical attention (report to your doctor or health care professional if they continue or are bothersome): -change in sex drive or performance -constipation -increased sweating -loss of appetite -nausea -tremors -weight loss This list may not describe all possible side effects. Call your doctor for medical advice about side effects. You may report side effects to FDA at 1-800-FDA-1088. Where should I keep my medicine? Keep out of the reach of children. Store at a controlled temperature between 20 and 25 degrees C (68 degrees and 77 degrees F), in a dry place. Throw away any unused medicine after the expiration date. NOTE: This sheet is a summary. It may not cover all possible information. If you have questions about this medicine, talk to your doctor, pharmacist, or health care provider.  2014, Elsevier/Gold Standard. (2012-12-18 12:46:03)  I will see you back in 6 months  Please call with any problems

## 2013-08-11 NOTE — Progress Notes (Signed)
OFFICE PROGRESS NOTE  [place], [address], [place] [id number] Dr. Alphonsa Overall Dr. 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 Patient was originally seen in the multidisciplinary breast clinic on 07/11/2010.  DCIS (ductal carcinoma in situ)   Primary site: Breast (Left)   Staging method: AJCC 7th Edition   Clinical: Stage 0 (Tis (DCIS), NX, cM0) signed by Deatra Robinson, MD on 08/11/2013  4:04 PM   Summary: Stage 0 (Tis (DCIS), NX, cM0)  PRIOR THERAPY:  #1 patient is status post left breast lumpectomy performed on 05/16/2011. The final pathology revealed a small focus of intermediate grade DCIS measuring 0.17 cm. The DCIS came close to 0.8 cm of the anterior margin. Remaining margins were free of carcinoma. The tumor was estrogen receptor +99% progesterone receptor +100%. Pathologic staging Tis NX.  #2 patient is seen by Dr. Arloa Koh for consideration of radiation therapy. She has a followup appointment with him on January 21.  #3 patient underwent genetic testing and counseling and she is negative for comprehensive analysis of BRCA1 and 2.  #4. S/P radiation therapy  #5. Tamoxifen 20 mg daily begin 09/27/11  CURRENT THERAPY:  tamoxifen 20 mg daily  INTERVAL HISTORY: Titania Gault 68 y.o. female returns for followup visit .overall she is doing well tolerating tamoxifen well with only hot flashes.She does of course suffer from great deal of anxiety. She otherwise has no nausea vomiting fevers chills night sweats no headaches double vision or blurring of vision she has not noticed any vaginal bleeding or discharge. No lower extremity swelling. Remainder of the 10 point review of systems is negative.  MEDICAL HISTORY: Past Medical History  Diagnosis Date  . Breast cancer 04/2011    DCIS left breast , ER/PR+  . Hypertension 05/11/2011  . Difficulty sleeping     since dx of cancer     ALLERGIES:  has No Known Allergies.  MEDICATIONS:  Current Outpatient Prescriptions  Medication Sig Dispense Refill  . cholecalciferol (VITAMIN D) 1000 UNITS tablet Take 1,000 Units by mouth daily.        Marland Kitchen ibuprofen (ADVIL,MOTRIN) 800 MG tablet       . losartan (COZAAR) 100 MG tablet Take 100 mg by mouth daily.        . tamoxifen (NOLVADEX) 20 MG tablet Take 1 tablet (20 mg total) by mouth daily.  30 tablet  12  . venlafaxine XR (EFFEXOR-XR) 37.5 MG 24 hr capsule Take 1 capsule (37.5 mg total) by mouth daily with breakfast.  30 capsule  6   No current facility-administered medications for this visit.    SURGICAL HISTORY:  Past Surgical History  Procedure Laterality Date  . Hip surgery  1982  . Nevus excision  05/16/2011    Procedure: NEVUS EXCISION;  Surgeon: Shann Medal, MD;  Location: [facility];  Service: General;  Laterality: Left;  remove mole left breast  . Breast lumpectomy  05/16/2011    Procedure: LUMPECTOMY;  Surgeon: Shann Medal, MD;  Location: [facility];  Service: General;  Laterality: Left;  Needle localization Left breast lumpectomy    REVIEW OF SYSTEMS:  Pertinent items are noted in HPI.   PHYSICAL EXAMINATION: General appearance: alert, cooperative and appears stated age Head: Normocephalic, without obvious abnormality, atraumatic Neck: no adenopathy, no carotid bruit, no JVD, supple, symmetrical, trachea midline and thyroid not enlarged, symmetric, no tenderness/mass/nodules Lymph nodes: Cervical, supraclavicular, and  axillary nodes normal. Resp: clear to auscultation bilaterally and normal percussion bilaterally Back: symmetric, no curvature. ROM normal. No CVA tenderness. Cardio: regular rate and rhythm, S1, S2 normal, no murmur, click, rub or gallop and normal apical impulse GI: soft, non-tender; bowel sounds normal; no masses,  no organomegaly Extremities: extremities normal, atraumatic, no cyanosis or  edema Neurologic: Alert and oriented X 3, normal strength and tone. Normal symmetric reflexes. Normal coordination and gait Bilateral breast examination. Left breast reveals a well-healed lumpectomy scar without any masses or skin redness. Right breast no masses or nipple discharge. ECOG PERFORMANCE STATUS: 0 - Asymptomatic  Blood pressure 134/83, pulse 81, temperature 97.9 F (36.6 C), temperature source Oral, resp. rate 18, height 5' 5" (1.651 m), weight 233 lb 12.8 oz (106.051 kg).  LABORATORY DATA: Lab Results  Component Value Date   WBC 9.7 08/07/2013   HGB 13.7 08/07/2013   HCT 41.5 08/07/2013   MCV 85.6 08/07/2013   PLT 209 08/07/2013      Chemistry      Component Value Date/Time   NA 145 08/07/2013 0913   NA 143 12/28/2011 1020   K 3.9 08/07/2013 0913   K 3.9 12/28/2011 1020   CL 109* 07/30/2012 0824   CL 107 12/28/2011 1020   CO2 26 08/07/2013 0913   CO2 27 12/28/2011 1020   BUN 14.5 08/07/2013 0913   BUN 17 12/28/2011 1020   CREATININE 0.7 08/07/2013 0913   CREATININE 0.71 12/28/2011 1020      Component Value Date/Time   CALCIUM 9.3 08/07/2013 0913   CALCIUM 9.2 12/28/2011 1020   ALKPHOS 69 08/07/2013 0913   ALKPHOS 67 12/28/2011 1020   AST 16 08/07/2013 0913   AST 16 12/28/2011 1020   ALT 14 08/07/2013 0913   ALT 15 12/28/2011 1020   BILITOT 0.31 08/07/2013 0913   BILITOT 0.4 12/28/2011 1020       RADIOGRAPHIC STUDIES:  No results found.  ASSESSMENT/PLAN: 68 year old female with:  1.  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 She is status post lumpectomy on 05/16/2011. The tumor was estrogen receptor +99% progesterone receptor +100%. Followed by radiation therapy to the breast. She was then started on Tamoxifen adjuvantly in April 2013. Patient will continue tamoxifen 20 mg daily.  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  #3 hot flashes secondary to tamoxifen  #4 insomnia and anxiety: Trial effexor 37.5 mg daily  #5 we discussed that time routines as well as  exercise and possibly doing yoga.    #6 patient will return in 6 months time for followup and labs.  All questions were answered. The patient knows to call the clinic with any problems, questions or concerns. We can certainly see the patient much sooner if necessary.  I spent 20 minutes counseling the patient face to face. The total time spent in the appointment was 30 minutes.    Marcy Panning, MD Medical/Oncology [facility] [phone] (beeper) [phone] (Office)

## 2013-09-03 ENCOUNTER — Telehealth (HOSPITAL_COMMUNITY): Payer: Self-pay | Admitting: Emergency Medicine

## 2014-01-18 ENCOUNTER — Telehealth: Payer: Self-pay | Admitting: Hematology and Oncology

## 2014-01-18 NOTE — Telephone Encounter (Signed)
, °

## 2014-02-06 ENCOUNTER — Other Ambulatory Visit: Payer: Medicare Other

## 2014-02-06 ENCOUNTER — Ambulatory Visit: Payer: Medicare Other | Admitting: Oncology

## 2014-02-25 ENCOUNTER — Encounter: Payer: Self-pay | Admitting: Hematology and Oncology

## 2014-02-25 ENCOUNTER — Telehealth: Payer: Self-pay | Admitting: Hematology and Oncology

## 2014-02-25 ENCOUNTER — Other Ambulatory Visit (HOSPITAL_BASED_OUTPATIENT_CLINIC_OR_DEPARTMENT_OTHER): Payer: Medicare Other

## 2014-02-25 ENCOUNTER — Ambulatory Visit (HOSPITAL_BASED_OUTPATIENT_CLINIC_OR_DEPARTMENT_OTHER): Payer: Medicare Other | Admitting: Hematology and Oncology

## 2014-02-25 VITALS — BP 158/65 | HR 82 | Temp 98.4°F | Resp 18 | Ht 65.0 in | Wt 240.2 lb

## 2014-02-25 DIAGNOSIS — D0590 Unspecified type of carcinoma in situ of unspecified breast: Secondary | ICD-10-CM

## 2014-02-25 DIAGNOSIS — D0510 Intraductal carcinoma in situ of unspecified breast: Secondary | ICD-10-CM

## 2014-02-25 DIAGNOSIS — Z170 Estrogen receptor positive status [ER+]: Secondary | ICD-10-CM

## 2014-02-25 DIAGNOSIS — D0512 Intraductal carcinoma in situ of left breast: Secondary | ICD-10-CM

## 2014-02-25 DIAGNOSIS — R61 Generalized hyperhidrosis: Secondary | ICD-10-CM

## 2014-02-25 LAB — CBC WITH DIFFERENTIAL/PLATELET
BASO%: 0.7 % (ref 0.0–2.0)
BASOS ABS: 0.1 10*3/uL (ref 0.0–0.1)
EOS%: 2.4 % (ref 0.0–7.0)
Eosinophils Absolute: 0.2 10*3/uL (ref 0.0–0.5)
HEMATOCRIT: 39.5 % (ref 34.8–46.6)
HEMOGLOBIN: 13 g/dL (ref 11.6–15.9)
LYMPH#: 3 10*3/uL (ref 0.9–3.3)
LYMPH%: 35.1 % (ref 14.0–49.7)
MCH: 28 pg (ref 25.1–34.0)
MCHC: 32.9 g/dL (ref 31.5–36.0)
MCV: 85.2 fL (ref 79.5–101.0)
MONO#: 0.6 10*3/uL (ref 0.1–0.9)
MONO%: 7 % (ref 0.0–14.0)
NEUT#: 4.7 10*3/uL (ref 1.5–6.5)
NEUT%: 54.8 % (ref 38.4–76.8)
Platelets: 216 10*3/uL (ref 145–400)
RBC: 4.63 10*6/uL (ref 3.70–5.45)
RDW: 14 % (ref 11.2–14.5)
WBC: 8.6 10*3/uL (ref 3.9–10.3)

## 2014-02-25 LAB — COMPREHENSIVE METABOLIC PANEL (CC13)
ALT: 13 U/L (ref 0–55)
AST: 20 U/L (ref 5–34)
Albumin: 3.1 g/dL — ABNORMAL LOW (ref 3.5–5.0)
Alkaline Phosphatase: 64 U/L (ref 40–150)
Anion Gap: 8 mEq/L (ref 3–11)
BUN: 13.7 mg/dL (ref 7.0–26.0)
CALCIUM: 8.7 mg/dL (ref 8.4–10.4)
CHLORIDE: 111 meq/L — AB (ref 98–109)
CO2: 25 mEq/L (ref 22–29)
CREATININE: 0.8 mg/dL (ref 0.6–1.1)
Glucose: 105 mg/dl (ref 70–140)
Potassium: 3.7 mEq/L (ref 3.5–5.1)
Sodium: 144 mEq/L (ref 136–145)
Total Bilirubin: 0.36 mg/dL (ref 0.20–1.20)
Total Protein: 6.6 g/dL (ref 6.4–8.3)

## 2014-02-25 NOTE — Progress Notes (Signed)
Patient Care Team: Thressa Sheller, MD as PCP - General (Internal Medicine) Thressa Sheller, MD as Attending Physician (Internal Medicine) Lindwood Coke, MD (Dermatology) Deatra Robinson, MD as Consulting Physician (Medical Oncology) Rexene Edison, MD as Consulting Physician (Radiation Oncology) Alphonsa Overall, MD as Consulting Physician (General Surgery)  DIAGNOSIS: DCIS (ductal carcinoma in situ)   Primary site: Breast (Left)   Staging method: AJCC 7th Edition   Clinical: Stage 0 (Tis (DCIS), NX, cM0) signed by Deatra Robinson, MD on 08/11/2013  4:04 PM   Summary: Stage 0 (Tis (DCIS), NX, cM0)   SUMMARY OF ONCOLOGIC HISTORY:  #1 patient is status post left breast lumpectomy performed on 05/16/2011. The final pathology revealed a small focus of intermediate grade DCIS measuring 0.17 cm. The DCIS came close to 0.8 cm of the anterior margin. Remaining margins were free of carcinoma. The tumor was estrogen receptor +99% progesterone receptor +100%. Pathologic staging Tis NX.   #2 radiation therapy to lumpectomy site by Dr. Valere Dross  #3 patient underwent genetic testing and counseling and she is negative for comprehensive analysis of BRCA1 and 2.   #4. Tamoxifen 20 mg daily begin 09/27/11   CHIEF COMPLIANT: Occasional hot flashes  INTERVAL HISTORY: Maria Bolton is a 68 year old Caucasian lady with a history of DCIS of left breast treated with lumpectomy and radiation therapy currently on antiestrogen therapy with tamoxifen. She is tolerating it fairly well without any major problems or concerns. She has occasional hot flashes for which she is on Effexor. She reports no new lumps or nodules in the breasts.   REVIEW OF SYSTEMS:   Constitutional: Denies fevers, chills or abnormal weight loss Eyes: Denies blurriness of vision Ears, nose, mouth, throat, and face: Denies mucositis or sore throat Respiratory: Denies cough, dyspnea or wheezes Cardiovascular: Denies palpitation, chest discomfort or  lower extremity swelling Gastrointestinal:  Denies nausea, heartburn or change in bowel habits Skin: Denies abnormal skin rashes, hot flashes Lymphatics: Denies new lymphadenopathy or easy bruising Neurological:Denies numbness, tingling or new weaknesses Behavioral/Psych: Mood is stable, no new changes  Breast:  denies any pain or lumps or nodules in either breasts All other systems were reviewed with the patient and are negative.  I have reviewed the past medical history, past surgical history, social history and family history with the patient and they are unchanged from previous note.  ALLERGIES:  has No Known Allergies.  MEDICATIONS:  Current Outpatient Prescriptions  Medication Sig Dispense Refill  . cholecalciferol (VITAMIN D) 1000 UNITS tablet Take 1,000 Units by mouth daily.        Marland Kitchen ibuprofen (ADVIL,MOTRIN) 800 MG tablet       . losartan (COZAAR) 100 MG tablet Take 100 mg by mouth daily.        . tamoxifen (NOLVADEX) 20 MG tablet Take 1 tablet (20 mg total) by mouth daily.  30 tablet  12  . venlafaxine XR (EFFEXOR-XR) 37.5 MG 24 hr capsule Take 1 capsule (37.5 mg total) by mouth daily with breakfast.  30 capsule  6   No current facility-administered medications for this visit.    PHYSICAL EXAMINATION: ECOG PERFORMANCE STATUS: 0 - Asymptomatic  Filed Vitals:   02/25/14 0944  BP: 158/65  Pulse: 82  Temp: 98.4 F (36.9 C)  Resp: 18   Filed Weights   02/25/14 0944  Weight: 240 lb 3.2 oz (108.954 kg)    GENERAL:alert, no distress and comfortable SKIN: skin color, texture, turgor are normal, no rashes or significant lesions EYES: normal,  Conjunctiva are pink and non-injected, sclera clear OROPHARYNX:no exudate, no erythema and lips, buccal mucosa, and tongue normal  NECK: supple, thyroid normal size, non-tender, without nodularity LYMPH:  no palpable lymphadenopathy in the cervical, axillary or inguinal LUNGS: clear to auscultation and percussion with normal  breathing effort HEART: regular rate & rhythm and no murmurs and no lower extremity edema ABDOMEN:abdomen soft, non-tender and normal bowel sounds Musculoskeletal:no cyanosis of digits and no clubbing  NEURO: alert & oriented x 3 with fluent speech, no focal motor/sensory deficits BREAST: No palpable masses or nodules in either right or left breasts. No palpable axillary supraclavicular or infraclavicular adenopathy no breast tenderness or nipple discharge.   LABORATORY DATA:  I have reviewed the data as listed   Chemistry      Component Value Date/Time   NA 144 02/25/2014 0931   NA 143 12/28/2011 1020   K 3.7 02/25/2014 0931   K 3.9 12/28/2011 1020   CL 109* 07/30/2012 0824   CL 107 12/28/2011 1020   CO2 25 02/25/2014 0931   CO2 27 12/28/2011 1020   BUN 13.7 02/25/2014 0931   BUN 17 12/28/2011 1020   CREATININE 0.8 02/25/2014 0931   CREATININE 0.71 12/28/2011 1020      Component Value Date/Time   CALCIUM 8.7 02/25/2014 0931   CALCIUM 9.2 12/28/2011 1020   ALKPHOS 64 02/25/2014 0931   ALKPHOS 67 12/28/2011 1020   AST 20 02/25/2014 0931   AST 16 12/28/2011 1020   ALT 13 02/25/2014 0931   ALT 15 12/28/2011 1020   BILITOT 0.36 02/25/2014 0931   BILITOT 0.4 12/28/2011 1020       Lab Results  Component Value Date   WBC 8.6 02/25/2014   HGB 13.0 02/25/2014   HCT 39.5 02/25/2014   MCV 85.2 02/25/2014   PLT 216 02/25/2014   NEUTROABS 4.7 02/25/2014     RADIOGRAPHIC STUDIES: No results found.   ASSESSMENT & PLAN:  DCIS (ductal carcinoma in situ) DCIS left breast status post lumpectomy ER/PR positive status post radiation therapy on tamoxifen since April 2013  Patient has no problems tolerating tamoxifen she has been on Effexor for hot flashes which is helping her somewhat she does do exercise with walking but does not do Yoga. she reports no new problems or concerns with a breast. Today's breast exam was normal.  Surveillance: Breast mammograms will be scheduled for November which will be her  annual followup and we will see her back in 6 months for another breast exam and followup.  Survivorship:Discussed the importance of physical exercise in decreasing the likelihood of breast cancer recurrence. Recommended 30 mins daily 6 days a week of either brisk walking or cycling or swimming. Encouraged patient to eat more fruits and vegetables and decrease red meat.     Orders Placed This Encounter  Procedures  . MM Digital Diagnostic Bilat    Standing Status: Future     Number of Occurrences:      Standing Expiration Date: 02/25/2015    Order Specific Question:  Reason for Exam (SYMPTOM  OR DIAGNOSIS REQUIRED)    Answer:  Left breast DCIS s/p lumpectomy annual follow up    Order Specific Question:  Preferred imaging location?    Answer:  External     Comments:  [facility]   The patient has a good understanding of the overall plan. she agrees with it. She will call with any problems that may develop before her next visit here.  I spent 15  minutes counseling the patient face to face. The total time spent in the appointment was 20 minutes and more than 50% was on counseling and review of test results    Rulon Eisenmenger, MD 02/25/2014 10:14 AM

## 2014-02-25 NOTE — Assessment & Plan Note (Signed)
DCIS left breast status post lumpectomy ER/PR positive status post radiation therapy on tamoxifen since April 2013  Patient has no problems tolerating tamoxifen she has been on Effexor for hot flashes which is helping her somewhat she does do exercise with walking but does not do Yoga. she reports no new problems or concerns with a breast. Today's breast exam was normal.  Surveillance: Breast mammograms will be scheduled for November which will be her annual followup and we will see her back in 6 months for another breast exam and followup.  Survivorship:Discussed the importance of physical exercise in decreasing the likelihood of breast cancer recurrence. Recommended 30 mins daily 6 days a week of either brisk walking or cycling or swimming. Encouraged patient to eat more fruits and vegetables and decrease red meat.

## 2014-02-25 NOTE — Telephone Encounter (Signed)
per pof to sch pt fpr appt-cld [facility] & made mamma appt-gave pt time & date of appt

## 2014-03-20 ENCOUNTER — Telehealth: Payer: Self-pay | Admitting: Hematology and Oncology

## 2014-03-20 NOTE — Telephone Encounter (Signed)
S/w pt to advise of appt chg from 3/25 (md pal) to 4/8 @ 9.45am. Pt verbalized understanding. Also mailed appt calendar.

## 2014-03-30 ENCOUNTER — Other Ambulatory Visit: Payer: Self-pay | Admitting: Oncology

## 2014-03-30 DIAGNOSIS — D0512 Intraductal carcinoma in situ of left breast: Secondary | ICD-10-CM

## 2014-05-21 ENCOUNTER — Telehealth: Payer: Self-pay

## 2014-05-21 NOTE — Telephone Encounter (Signed)
Mammogram results received from [facility] 05/05/14 Dr Luan Pulling.  Copy to Dr [name].  Sent to scan.

## 2014-05-23 NOTE — Telephone Encounter (Signed)
No note

## 2014-06-17 DIAGNOSIS — E559 Vitamin D deficiency, unspecified: Secondary | ICD-10-CM | POA: Diagnosis not present

## 2014-06-17 DIAGNOSIS — M109 Gout, unspecified: Secondary | ICD-10-CM | POA: Diagnosis not present

## 2014-06-17 DIAGNOSIS — I10 Essential (primary) hypertension: Secondary | ICD-10-CM | POA: Diagnosis not present

## 2014-06-17 DIAGNOSIS — R7301 Impaired fasting glucose: Secondary | ICD-10-CM | POA: Diagnosis not present

## 2014-06-24 DIAGNOSIS — R7301 Impaired fasting glucose: Secondary | ICD-10-CM | POA: Diagnosis not present

## 2014-06-24 DIAGNOSIS — E0590 Thyrotoxicosis, unspecified without thyrotoxic crisis or storm: Secondary | ICD-10-CM | POA: Diagnosis not present

## 2014-06-24 DIAGNOSIS — I10 Essential (primary) hypertension: Secondary | ICD-10-CM | POA: Diagnosis not present

## 2014-06-24 DIAGNOSIS — M109 Gout, unspecified: Secondary | ICD-10-CM | POA: Diagnosis not present

## 2014-08-21 ENCOUNTER — Other Ambulatory Visit: Payer: Self-pay | Admitting: Oncology

## 2014-08-21 NOTE — Telephone Encounter (Signed)
Last ov 02/2014.  Next ov 09/2014.  Chart reviewed

## 2014-08-29 ENCOUNTER — Ambulatory Visit: Payer: Medicare Other | Admitting: Hematology and Oncology

## 2014-09-12 ENCOUNTER — Telehealth: Payer: Self-pay | Admitting: Hematology and Oncology

## 2014-09-12 ENCOUNTER — Ambulatory Visit (HOSPITAL_BASED_OUTPATIENT_CLINIC_OR_DEPARTMENT_OTHER): Payer: Medicare Other | Admitting: Hematology and Oncology

## 2014-09-12 VITALS — BP 154/65 | HR 79 | Temp 97.7°F | Resp 20 | Ht 65.0 in | Wt 232.0 lb

## 2014-09-12 DIAGNOSIS — N951 Menopausal and female climacteric states: Secondary | ICD-10-CM

## 2014-09-12 DIAGNOSIS — Z170 Estrogen receptor positive status [ER+]: Secondary | ICD-10-CM

## 2014-09-12 DIAGNOSIS — D0512 Intraductal carcinoma in situ of left breast: Secondary | ICD-10-CM | POA: Diagnosis not present

## 2014-09-12 NOTE — Telephone Encounter (Signed)
Appointments made and avs printed for patient °

## 2014-09-12 NOTE — Assessment & Plan Note (Signed)
Left breast DCIS status post lumpectomy 05/16/2011, intermediate grade, 0.17 cm, ER 99%, PR to person, Tis N0 stage 0, adjuvant radiation therapy by Dr. Valere Dross, genetic testing negative for BRCA1 and 2, tamoxifen 20 mg started 09/27/2011.  Tamoxifen toxicities: 1. Occasional hot flashes for which she is on Effexor 2. Occasional myalgias  Breast cancer surveillance: 1. Breast exam 09/12/2014 is normal 2. Mammogram 05/05/2014 is normal (breast density category B)  Return to clinic in 6 months for follow-up

## 2014-09-12 NOTE — Progress Notes (Signed)
Patient Care Team: Thressa Sheller, MD as PCP - General (Internal Medicine) Thressa Sheller, MD as Attending Physician (Internal Medicine) Lindwood Coke, MD (Dermatology) Consuela Mimes, MD as Consulting Physician ([place]) Arloa Koh, MD as Consulting Physician (Radiation Oncology) Alphonsa Overall, MD as Consulting Physician (General Surgery)  Staging form: Breast, AJCC 7th Edition  DIAGNOSIS: DCIS (ductal carcinoma in situ)  Primary site: Breast (Left)  Staging method: AJCC 7th Edition  Clinical: Stage 0 (Tis (DCIS), NX, cM0) signed by Deatra Robinson, MD on 08/11/2013 4:04 PM  Summary: Stage 0 (Tis (DCIS), NX, cM0)   SUMMARY OF ONCOLOGIC HISTORY:  #1 patient is status post left breast lumpectomy performed on 05/16/2011. The final pathology revealed a small focus of intermediate grade DCIS measuring 0.17 cm. The DCIS came close to 0.8 cm of the anterior margin. Remaining margins were free of carcinoma. The tumor was estrogen receptor +99% progesterone receptor +100%. Pathologic staging Tis NX.   #2 radiation therapy to lumpectomy site by Dr. Valere Dross  #3 patient underwent genetic testing and counseling and she is negative for comprehensive analysis of BRCA1 and 2.   #4. Tamoxifen 20 mg daily begin 09/27/11   CHIEF COMPLIANT: Hot flashes and painful left breast  INTERVAL HISTORY: Maria Bolton is a 69 year old with above-mentioned history of left-sided breast cancer with lumpectomy and radiation and is currently on tamoxifen. Tamoxifen causes lot of hot flashes. She was prescribed Effexor for hot flashes. But she does not know if it is doing anything for her. She also complains of severe tenderness in the left breast which is how it has been since radiation. There has not been any lumps or nodules.  REVIEW OF SYSTEMS:   Constitutional: Denies fevers, chills or abnormal weight loss Eyes: Denies blurriness of vision Ears, nose, mouth, throat, and face: Denies mucositis or sore  throat Respiratory: Denies cough, dyspnea or wheezes Cardiovascular: Denies palpitation, chest discomfort or lower extremity swelling Gastrointestinal:  Denies nausea, heartburn or change in bowel habits Skin: Denies abnormal skin rashes Lymphatics: Denies new lymphadenopathy or easy bruising Neurological:Denies numbness, tingling or new weaknesses Behavioral/Psych: Mood is stable, no new changes  Breast: Pain in the left breast All other systems were reviewed with the patient and are negative.  I have reviewed the past medical history, past surgical history, social history and family history with the patient and they are unchanged from previous note.  ALLERGIES:  has No Known Allergies.  MEDICATIONS:  Current Outpatient Prescriptions  Medication Sig Dispense Refill  . ibuprofen (ADVIL,MOTRIN) 800 MG tablet     . losartan (COZAAR) 100 MG tablet Take 100 mg by mouth daily.      . tamoxifen (NOLVADEX) 20 MG tablet TAKE 1 TABLET BY MOUTH EVERY DAY 30 tablet 1  . venlafaxine XR (EFFEXOR-XR) 37.5 MG 24 hr capsule TAKE 1 CAPSULE BY MOUTH DAILY WITH BREAKFAST. 30 capsule 5   No current facility-administered medications for this visit.    PHYSICAL EXAMINATION: ECOG PERFORMANCE STATUS: 1 - Symptomatic but completely ambulatory  Filed Vitals:   09/12/14 0939  BP: 154/65  Pulse: 79  Temp: 97.7 F (36.5 C)  Resp: 20   Filed Weights   09/12/14 0939  Weight: 232 lb (105.235 kg)    GENERAL:alert, no distress and comfortable SKIN: skin color, texture, turgor are normal, no rashes or significant lesions EYES: normal, Conjunctiva are pink and non-injected, sclera clear OROPHARYNX:no exudate, no erythema and lips, buccal mucosa, and tongue normal  NECK: supple, thyroid normal size, non-tender, without  nodularity LYMPH:  no palpable lymphadenopathy in the cervical, axillary or inguinal LUNGS: clear to auscultation and percussion with normal breathing effort HEART: regular rate & rhythm  and no murmurs and no lower extremity edema ABDOMEN:abdomen soft, non-tender and normal bowel sounds Musculoskeletal:no cyanosis of digits and no clubbing  NEURO: alert & oriented x 3 with fluent speech, no focal motor/sensory deficits BREAST: No palpable masses or nodules in either right or left breasts. No palpable axillary supraclavicular or infraclavicular areas there is tenderness in the left breast to palpation but no lumps or nodules. (exam performed in the presence of a chaperone)  LABORATORY DATA:  I have reviewed the data as listed   Chemistry      Component Value Date/Time   NA 144 02/25/2014 0931   NA 143 12/28/2011 1020   K 3.7 02/25/2014 0931   K 3.9 12/28/2011 1020   CL 109* 07/30/2012 0824   CL 107 12/28/2011 1020   CO2 25 02/25/2014 0931   CO2 27 12/28/2011 1020   BUN 13.7 02/25/2014 0931   BUN 17 12/28/2011 1020   CREATININE 0.8 02/25/2014 0931   CREATININE 0.71 12/28/2011 1020      Component Value Date/Time   CALCIUM 8.7 02/25/2014 0931   CALCIUM 9.2 12/28/2011 1020   ALKPHOS 64 02/25/2014 0931   ALKPHOS 67 12/28/2011 1020   AST 20 02/25/2014 0931   AST 16 12/28/2011 1020   ALT 13 02/25/2014 0931   ALT 15 12/28/2011 1020   BILITOT 0.36 02/25/2014 0931   BILITOT 0.4 12/28/2011 1020       Lab Results  Component Value Date   WBC 8.6 02/25/2014   HGB 13.0 02/25/2014   HCT 39.5 02/25/2014   MCV 85.2 02/25/2014   PLT 216 02/25/2014   NEUTROABS 4.7 02/25/2014     RADIOGRAPHIC STUDIES: I have personally reviewed the radiology reports and agreed with their findings. Mammogram November 2015 is normal  ASSESSMENT & PLAN:  Ductal carcinoma in situ (DCIS) of left breast Left breast DCIS status post lumpectomy 05/16/2011, intermediate grade, 0.17 cm, ER 99%, PR to person, Tis N0 stage 0, adjuvant radiation therapy by Dr. Valere Dross, genetic testing negative for BRCA1 and 2, tamoxifen 20 mg started 09/27/2011.  Tamoxifen toxicities: 1. Occasional hot flashes  for which she is on Effexor: I discussed with her that she can stop Effexor and see if her hot flashes get worse. They do get worse she can resume Effexor. 2. Occasional myalgias  Breast cancer surveillance: 1. Breast exam 09/12/2014 is normal 2. Mammogram 05/05/2014 is normal (breast density category B)  Return to clinic in 6 months for follow-up Breast tenderness without any lumps or nodules   No orders of the defined types were placed in this encounter.   The patient has a good understanding of the overall plan. she agrees with it. She will call with any problems that may develop before her next visit here.   Rulon Eisenmenger, MD

## 2014-09-16 DIAGNOSIS — M109 Gout, unspecified: Secondary | ICD-10-CM | POA: Diagnosis not present

## 2014-09-16 DIAGNOSIS — R7301 Impaired fasting glucose: Secondary | ICD-10-CM | POA: Diagnosis not present

## 2014-09-16 DIAGNOSIS — I10 Essential (primary) hypertension: Secondary | ICD-10-CM | POA: Diagnosis not present

## 2014-09-16 DIAGNOSIS — E559 Vitamin D deficiency, unspecified: Secondary | ICD-10-CM | POA: Diagnosis not present

## 2014-09-23 DIAGNOSIS — I10 Essential (primary) hypertension: Secondary | ICD-10-CM | POA: Diagnosis not present

## 2014-09-23 DIAGNOSIS — E0590 Thyrotoxicosis, unspecified without thyrotoxic crisis or storm: Secondary | ICD-10-CM | POA: Diagnosis not present

## 2014-09-23 DIAGNOSIS — R7309 Other abnormal glucose: Secondary | ICD-10-CM | POA: Diagnosis not present

## 2014-09-23 DIAGNOSIS — E559 Vitamin D deficiency, unspecified: Secondary | ICD-10-CM | POA: Diagnosis not present

## 2014-09-26 ENCOUNTER — Telehealth: Payer: Self-pay

## 2014-09-26 NOTE — Telephone Encounter (Signed)
Let pt know jury summons release requires MD to attest that pt is disabled and that we could not do that at this time.  Let pt know I would leave original form for her at main desk for pickup.  Pt voiced udnerstanding.

## 2014-09-29 ENCOUNTER — Other Ambulatory Visit: Payer: Self-pay | Admitting: Hematology and Oncology

## 2014-09-29 DIAGNOSIS — D0512 Intraductal carcinoma in situ of left breast: Secondary | ICD-10-CM

## 2014-10-19 ENCOUNTER — Other Ambulatory Visit: Payer: Self-pay | Admitting: Hematology and Oncology

## 2014-10-20 NOTE — Telephone Encounter (Signed)
Last OV 4/8/.  Next OV 03/12/15.  Chart reviewed.

## 2014-11-12 DIAGNOSIS — F439 Reaction to severe stress, unspecified: Secondary | ICD-10-CM | POA: Diagnosis not present

## 2015-03-11 NOTE — Assessment & Plan Note (Signed)
Left breast DCIS status post lumpectomy 05/16/2011, intermediate grade, 0.17 cm, ER 99%, PR to person, Tis N0 stage 0, adjuvant radiation therapy by Dr. Valere Dross, genetic testing negative for BRCA1 and 2, tamoxifen 20 mg started 09/27/2011.  Tamoxifen toxicities: 1. Occasional hot flashes for which she is on Effexor: I discussed with her that she can stop Effexor and see if her hot flashes get worse. They do get worse she can resume Effexor. 2. Occasional myalgias  Breast cancer surveillance: 1. Breast exam 03/12/2015 is normal 2. Mammogram 05/05/2014 is normal (breast density category B)  Return to clinic in 6 months for follow-up

## 2015-03-12 ENCOUNTER — Telehealth: Payer: Self-pay | Admitting: Hematology and Oncology

## 2015-03-12 ENCOUNTER — Encounter: Payer: Self-pay | Admitting: Hematology and Oncology

## 2015-03-12 ENCOUNTER — Ambulatory Visit (HOSPITAL_BASED_OUTPATIENT_CLINIC_OR_DEPARTMENT_OTHER): Payer: Medicare Other | Admitting: Hematology and Oncology

## 2015-03-12 VITALS — BP 154/68 | HR 85 | Temp 98.5°F | Resp 19 | Ht 65.0 in | Wt 244.6 lb

## 2015-03-12 DIAGNOSIS — D0512 Intraductal carcinoma in situ of left breast: Secondary | ICD-10-CM | POA: Diagnosis not present

## 2015-03-12 NOTE — Progress Notes (Signed)
Patient Care Team: Thressa Sheller, MD as PCP - General (Internal Medicine) Thressa Sheller, MD as Attending Physician (Internal Medicine) Lindwood Coke, MD (Dermatology) Consuela Mimes, MD as Consulting Physician (Medical Oncology) Arloa Koh, MD as Consulting Physician (Radiation Oncology) Alphonsa Overall, MD as Consulting Physician (General Surgery)  DIAGNOSIS: Ductal carcinoma in situ (DCIS) of left breast   Staging form: Breast, AJCC 7th Edition     Clinical: Stage 0 (Tis (DCIS), NX, cM0) - Signed by Deatra Robinson, MD on 08/11/2013     Pathologic: No stage assigned - Unsigned  CHIEF COMPLIANT: follow-up 1 year on tamoxifen since 09/27/2011  INTERVAL HISTORY: Maria Bolton is a 69 year old with above-mentioned history of DCIS of the left breast was currently on tamoxifen therapy she is tolerating it very well without any major problems or concerns. She does have hot flashes. She previously tried Effexor but it did not help her. So she stopped it. She is able to manage it without any medication. Occasional myalgias. But they're not very bothersome.  REVIEW OF SYSTEMS:   Constitutional: Denies fevers, chills or abnormal weight loss, complaints of hot flashes. Eyes: Denies blurriness of vision Ears, nose, mouth, throat, and face: Denies mucositis or sore throat Respiratory: Denies cough, dyspnea or wheezes Cardiovascular: Denies palpitation, chest discomfort or lower extremity swelling Gastrointestinal:  Denies nausea, heartburn or change in bowel habits Skin: Denies abnormal skin rashes Lymphatics: Denies new lymphadenopathy or easy bruising Neurological:Denies numbness, tingling or new weaknesses Behavioral/Psych: Mood is stable, no new changes  Breast: denies any lumps or nodules in the breasts.   denies any pain or lumps or nodules in either breasts All other systems were reviewed with the patient and are negative.  I have reviewed the past medical history, past surgical history,  social history and family history with the patient and they are unchanged from previous note.  ALLERGIES:  has No Known Allergies.  MEDICATIONS:  Current Outpatient Prescriptions  Medication Sig Dispense Refill  . ibuprofen (ADVIL,MOTRIN) 800 MG tablet     . losartan (COZAAR) 100 MG tablet Take 100 mg by mouth daily.      . tamoxifen (NOLVADEX) 20 MG tablet TAKE 1 TABLET BY MOUTH EVERY DAY 90 tablet 1   No current facility-administered medications for this visit.    PHYSICAL EXAMINATION: ECOG PERFORMANCE STATUS: 1 - Symptomatic but completely ambulatory  Filed Vitals:   03/12/15 0947  BP: 154/68  Pulse: 85  Temp: 98.5 F (36.9 C)  Resp: 19   Filed Weights   03/12/15 0947  Weight: 244 lb 9.6 oz (110.95 kg)    GENERAL:alert, no distress and comfortable SKIN: skin color, texture, turgor are normal, no rashes or significant lesions EYES: normal, Conjunctiva are pink and non-injected, sclera clear OROPHARYNX:no exudate, no erythema and lips, buccal mucosa, and tongue normal  NECK: supple, thyroid normal size, non-tender, without nodularity LYMPH:  no palpable lymphadenopathy in the cervical, axillary or inguinal LUNGS: clear to auscultation and percussion with normal breathing effort HEART: regular rate & rhythm and no murmurs and no lower extremity edema ABDOMEN:abdomen soft, non-tender and normal bowel sounds Musculoskeletal:no cyanosis of digits and no clubbing  NEURO: alert & oriented x 3 with fluent speech, no focal motor/sensory deficits BREAST: left breast tenderness otherwise no palpable lumps or nodules. (exam performed in the presence of a chaperone)  LABORATORY DATA:  I have reviewed the data as listed   Chemistry      Component Value Date/Time   NA 144 02/25/2014 0931  NA 143 12/28/2011 1020   K 3.7 02/25/2014 0931   K 3.9 12/28/2011 1020   CL 109* 07/30/2012 0824   CL 107 12/28/2011 1020   CO2 25 02/25/2014 0931   CO2 27 12/28/2011 1020   BUN 13.7  02/25/2014 0931   BUN 17 12/28/2011 1020   CREATININE 0.8 02/25/2014 0931   CREATININE 0.71 12/28/2011 1020      Component Value Date/Time   CALCIUM 8.7 02/25/2014 0931   CALCIUM 9.2 12/28/2011 1020   ALKPHOS 64 02/25/2014 0931   ALKPHOS 67 12/28/2011 1020   AST 20 02/25/2014 0931   AST 16 12/28/2011 1020   ALT 13 02/25/2014 0931   ALT 15 12/28/2011 1020   BILITOT 0.36 02/25/2014 0931   BILITOT 0.4 12/28/2011 1020       Lab Results  Component Value Date   WBC 8.6 02/25/2014   HGB 13.0 02/25/2014   HCT 39.5 02/25/2014   MCV 85.2 02/25/2014   PLT 216 02/25/2014   NEUTROABS 4.7 02/25/2014   ASSESSMENT & PLAN:  Ductal carcinoma in situ (DCIS) of left breast Left breast DCIS status post lumpectomy 05/16/2011, intermediate grade, 0.17 cm, ER 99%, PR to person, Tis N0 stage 0, adjuvant radiation therapy by Dr. Valere Dross, genetic testing negative for BRCA1 and 2, tamoxifen 20 mg started 09/27/2011.  Tamoxifen toxicities: 1. Occasional hot flashes. She finally stopped Effexor because it was not helping. 2. Occasional myalgias  Breast cancer surveillance: 1. Breast exam 03/12/2015 is normal, very tender to palpation on the left breast 2. Mammogram 05/05/2014 is normal (breast density category B)  Return to clinic in 6 months for follow-up and after that we will see her once a year   No orders of the defined types were placed in this encounter.   The patient has a good understanding of the overall plan. she agrees with it. she will call with any problems that may develop before the next visit here.   Rulon Eisenmenger, MD

## 2015-03-12 NOTE — Telephone Encounter (Signed)
Appointments made and avs printed for patient °

## 2015-03-17 DIAGNOSIS — N390 Urinary tract infection, site not specified: Secondary | ICD-10-CM | POA: Diagnosis not present

## 2015-03-17 DIAGNOSIS — Z0000 Encounter for general adult medical examination without abnormal findings: Secondary | ICD-10-CM | POA: Diagnosis not present

## 2015-03-17 DIAGNOSIS — I10 Essential (primary) hypertension: Secondary | ICD-10-CM | POA: Diagnosis not present

## 2015-03-17 DIAGNOSIS — Z1389 Encounter for screening for other disorder: Secondary | ICD-10-CM | POA: Diagnosis not present

## 2015-03-17 DIAGNOSIS — F17211 Nicotine dependence, cigarettes, in remission: Secondary | ICD-10-CM | POA: Diagnosis not present

## 2015-03-17 DIAGNOSIS — R7309 Other abnormal glucose: Secondary | ICD-10-CM | POA: Diagnosis not present

## 2015-03-17 DIAGNOSIS — Z23 Encounter for immunization: Secondary | ICD-10-CM | POA: Diagnosis not present

## 2015-03-17 DIAGNOSIS — E559 Vitamin D deficiency, unspecified: Secondary | ICD-10-CM | POA: Diagnosis not present

## 2015-03-17 DIAGNOSIS — M109 Gout, unspecified: Secondary | ICD-10-CM | POA: Diagnosis not present

## 2015-03-26 DIAGNOSIS — R7309 Other abnormal glucose: Secondary | ICD-10-CM | POA: Diagnosis not present

## 2015-03-26 DIAGNOSIS — J449 Chronic obstructive pulmonary disease, unspecified: Secondary | ICD-10-CM | POA: Diagnosis not present

## 2015-03-26 DIAGNOSIS — F17211 Nicotine dependence, cigarettes, in remission: Secondary | ICD-10-CM | POA: Diagnosis not present

## 2015-03-26 DIAGNOSIS — L4050 Arthropathic psoriasis, unspecified: Secondary | ICD-10-CM | POA: Diagnosis not present

## 2015-03-27 ENCOUNTER — Telehealth: Payer: Self-pay | Admitting: Hematology and Oncology

## 2015-03-27 NOTE — Telephone Encounter (Signed)
s.w. pt and r/s 4.13 appt to 4.21 due to md on pal..Marland KitchenMarland KitchenMarland Kitchenpt ok and aware of new d.t

## 2015-04-28 ENCOUNTER — Other Ambulatory Visit: Payer: Self-pay | Admitting: *Deleted

## 2015-04-28 DIAGNOSIS — D0512 Intraductal carcinoma in situ of left breast: Secondary | ICD-10-CM

## 2015-04-28 MED ORDER — TAMOXIFEN CITRATE 20 MG PO TABS: 20.0000 mg | ORAL_TABLET | Freq: Every day | ORAL | Status: DC

## 2015-04-29 DIAGNOSIS — C50919 Malignant neoplasm of unspecified site of unspecified female breast: Secondary | ICD-10-CM | POA: Diagnosis not present

## 2015-04-29 DIAGNOSIS — I10 Essential (primary) hypertension: Secondary | ICD-10-CM | POA: Diagnosis not present

## 2015-04-29 DIAGNOSIS — L03119 Cellulitis of unspecified part of limb: Secondary | ICD-10-CM | POA: Diagnosis not present

## 2015-05-06 DIAGNOSIS — I509 Heart failure, unspecified: Secondary | ICD-10-CM | POA: Diagnosis not present

## 2015-05-13 DIAGNOSIS — I509 Heart failure, unspecified: Secondary | ICD-10-CM | POA: Diagnosis not present

## 2015-05-13 DIAGNOSIS — R609 Edema, unspecified: Secondary | ICD-10-CM | POA: Diagnosis not present

## 2015-05-21 DIAGNOSIS — D0592 Unspecified type of carcinoma in situ of left breast: Secondary | ICD-10-CM | POA: Diagnosis not present

## 2015-05-25 DIAGNOSIS — R928 Other abnormal and inconclusive findings on diagnostic imaging of breast: Secondary | ICD-10-CM | POA: Diagnosis not present

## 2015-05-25 DIAGNOSIS — Z853 Personal history of malignant neoplasm of breast: Secondary | ICD-10-CM | POA: Diagnosis not present

## 2015-06-05 ENCOUNTER — Encounter: Payer: Self-pay | Admitting: *Deleted

## 2015-06-05 NOTE — Progress Notes (Signed)
Received mammo report from [facility], reviewed by Dr. [name], sent to scan.

## 2015-06-23 ENCOUNTER — Encounter: Payer: Self-pay | Admitting: Hematology and Oncology

## 2015-06-26 DIAGNOSIS — J069 Acute upper respiratory infection, unspecified: Secondary | ICD-10-CM | POA: Diagnosis not present

## 2015-08-25 DIAGNOSIS — J441 Chronic obstructive pulmonary disease with (acute) exacerbation: Secondary | ICD-10-CM | POA: Diagnosis not present

## 2015-08-25 DIAGNOSIS — R0602 Shortness of breath: Secondary | ICD-10-CM | POA: Diagnosis not present

## 2015-09-17 ENCOUNTER — Ambulatory Visit: Payer: Self-pay | Admitting: Hematology and Oncology

## 2015-09-18 ENCOUNTER — Emergency Department (HOSPITAL_COMMUNITY): Payer: Medicare Other

## 2015-09-18 ENCOUNTER — Observation Stay (HOSPITAL_COMMUNITY)
Admission: EM | Admit: 2015-09-18 | Discharge: 2015-09-20 | Disposition: A | Discharge status: Home / Self Care | Payer: Medicare Other | Attending: Internal Medicine | Admitting: Internal Medicine

## 2015-09-18 ENCOUNTER — Encounter (HOSPITAL_COMMUNITY): Payer: Self-pay | Admitting: Nurse Practitioner

## 2015-09-18 DIAGNOSIS — Z923 Personal history of irradiation: Secondary | ICD-10-CM | POA: Diagnosis not present

## 2015-09-18 DIAGNOSIS — J209 Acute bronchitis, unspecified: Secondary | ICD-10-CM | POA: Diagnosis not present

## 2015-09-18 DIAGNOSIS — Z791 Long term (current) use of non-steroidal anti-inflammatories (NSAID): Secondary | ICD-10-CM | POA: Insufficient documentation

## 2015-09-18 DIAGNOSIS — R000 Tachycardia, unspecified: Secondary | ICD-10-CM

## 2015-09-18 DIAGNOSIS — Z79810 Long term (current) use of selective estrogen receptor modulators (SERMs): Secondary | ICD-10-CM | POA: Diagnosis not present

## 2015-09-18 DIAGNOSIS — R05 Cough: Secondary | ICD-10-CM

## 2015-09-18 DIAGNOSIS — R069 Unspecified abnormalities of breathing: Secondary | ICD-10-CM | POA: Diagnosis not present

## 2015-09-18 DIAGNOSIS — F418 Other specified anxiety disorders: Secondary | ICD-10-CM | POA: Diagnosis present

## 2015-09-18 DIAGNOSIS — I10 Essential (primary) hypertension: Secondary | ICD-10-CM | POA: Diagnosis present

## 2015-09-18 DIAGNOSIS — E669 Obesity, unspecified: Secondary | ICD-10-CM | POA: Insufficient documentation

## 2015-09-18 DIAGNOSIS — R6510 Systemic inflammatory response syndrome (SIRS) of non-infectious origin without acute organ dysfunction: Secondary | ICD-10-CM

## 2015-09-18 DIAGNOSIS — R509 Fever, unspecified: Secondary | ICD-10-CM | POA: Diagnosis not present

## 2015-09-18 DIAGNOSIS — J9811 Atelectasis: Secondary | ICD-10-CM | POA: Diagnosis not present

## 2015-09-18 DIAGNOSIS — D0512 Intraductal carcinoma in situ of left breast: Secondary | ICD-10-CM | POA: Diagnosis present

## 2015-09-18 DIAGNOSIS — Z87891 Personal history of nicotine dependence: Secondary | ICD-10-CM | POA: Insufficient documentation

## 2015-09-18 DIAGNOSIS — R059 Cough, unspecified: Secondary | ICD-10-CM

## 2015-09-18 DIAGNOSIS — Z6839 Body mass index (BMI) 39.0-39.9, adult: Secondary | ICD-10-CM | POA: Diagnosis not present

## 2015-09-18 DIAGNOSIS — Z79899 Other long term (current) drug therapy: Secondary | ICD-10-CM | POA: Insufficient documentation

## 2015-09-18 DIAGNOSIS — J189 Pneumonia, unspecified organism: Secondary | ICD-10-CM

## 2015-09-18 DIAGNOSIS — Z853 Personal history of malignant neoplasm of breast: Secondary | ICD-10-CM | POA: Diagnosis not present

## 2015-09-18 DIAGNOSIS — R6520 Severe sepsis without septic shock: Secondary | ICD-10-CM | POA: Diagnosis not present

## 2015-09-18 LAB — I-STAT TROPONIN, ED: Troponin i, poc: 0 ng/mL (ref 0.00–0.08)

## 2015-09-18 LAB — I-STAT CHEM 8, ED
BUN: 12 mg/dL (ref 6–20)
CALCIUM ION: 1.1 mmol/L — AB (ref 1.13–1.30)
CHLORIDE: 105 mmol/L (ref 101–111)
Creatinine, Ser: 0.7 mg/dL (ref 0.44–1.00)
GLUCOSE: 113 mg/dL — AB (ref 65–99)
HCT: 41 % (ref 36.0–46.0)
Hemoglobin: 13.9 g/dL (ref 12.0–15.0)
Potassium: 3.6 mmol/L (ref 3.5–5.1)
Sodium: 141 mmol/L (ref 135–145)
TCO2: 23 mmol/L (ref 0–100)

## 2015-09-18 LAB — CBC WITH DIFFERENTIAL/PLATELET
Basophils Absolute: 0 10*3/uL (ref 0.0–0.1)
Basophils Relative: 0 %
Eosinophils Absolute: 0.4 10*3/uL (ref 0.0–0.7)
Eosinophils Relative: 4 %
HEMATOCRIT: 37.3 % (ref 36.0–46.0)
HEMOGLOBIN: 12.4 g/dL (ref 12.0–15.0)
LYMPHS ABS: 3.4 10*3/uL (ref 0.7–4.0)
Lymphocytes Relative: 32 %
MCH: 27.6 pg (ref 26.0–34.0)
MCHC: 33.2 g/dL (ref 30.0–36.0)
MCV: 82.9 fL (ref 78.0–100.0)
MONOS PCT: 9 %
Monocytes Absolute: 0.9 10*3/uL (ref 0.1–1.0)
NEUTROS ABS: 5.9 10*3/uL (ref 1.7–7.7)
NEUTROS PCT: 55 %
Platelets: 200 10*3/uL (ref 150–400)
RBC: 4.5 MIL/uL (ref 3.87–5.11)
RDW: 15.1 % (ref 11.5–15.5)
WBC: 10.6 10*3/uL — ABNORMAL HIGH (ref 4.0–10.5)

## 2015-09-18 LAB — I-STAT CG4 LACTIC ACID, ED: Lactic Acid, Venous: 1.54 mmol/L (ref 0.5–2.0)

## 2015-09-18 MED ORDER — SODIUM CHLORIDE 0.9 % IV SOLN
1000.0000 mL | INTRAVENOUS | Status: DC
  Administered 2015-09-18: 1000 mL via INTRAVENOUS

## 2015-09-18 MED ORDER — PIPERACILLIN-TAZOBACTAM 3.375 G IVPB 30 MIN
3.3750 g | Freq: Once | INTRAVENOUS | Status: AC
  Administered 2015-09-18: 3.375 g via INTRAVENOUS
  Filled 2015-09-18: qty 50

## 2015-09-18 MED ORDER — SODIUM CHLORIDE 0.9 % IV BOLUS (SEPSIS)
1000.0000 mL | Freq: Once | INTRAVENOUS | Status: AC
  Administered 2015-09-18: 1000 mL via INTRAVENOUS

## 2015-09-18 MED ORDER — VANCOMYCIN HCL IN DEXTROSE 1-5 GM/200ML-% IV SOLN
1000.0000 mg | Freq: Once | INTRAVENOUS | Status: AC
  Administered 2015-09-18: 1000 mg via INTRAVENOUS
  Filled 2015-09-18: qty 200

## 2015-09-18 MED ORDER — IPRATROPIUM-ALBUTEROL 0.5-2.5 (3) MG/3ML IN SOLN
3.0000 mL | Freq: Once | RESPIRATORY_TRACT | Status: AC
  Administered 2015-09-18: 3 mL via RESPIRATORY_TRACT
  Filled 2015-09-18: qty 3

## 2015-09-18 MED ORDER — SODIUM CHLORIDE 0.9 % IV BOLUS (SEPSIS)
500.0000 mL | INTRAVENOUS | Status: AC
  Administered 2015-09-18: 500 mL via INTRAVENOUS

## 2015-09-18 NOTE — ED Notes (Signed)
Pt. Made aware for the need of urine. 

## 2015-09-18 NOTE — ED Notes (Signed)
Pt c/o of chest pain and shOB, new onset today, mildly anxious.

## 2015-09-18 NOTE — ED Provider Notes (Signed)
TIME SEEN: 11:00 PM  CHIEF COMPLAINT: fever, cough, SOB  HPI: Pt is a 70 y.o. F with h/o breast cancer s/p radiation on Tamoxifen x 3 years, hypertension who presents to the emergency department with fever today of 101, dry cough, shortness of breath. She denies any chest pain or chest discomfort. No vomiting or diarrhea. No headache, neck pain or neck stiffness, abdominal pain, back pain, rash. No lower extremity swelling or pain. She is wheezing was never been told she has asthma or COPD. Was previously smoker and quit in 2011. No history of CAD, CHF. No known sick contacts. She has not had an influenza vaccination.  PCP is Dr. Noah Delaine  ROS: See HPI Constitutional:  fever  Eyes: no drainage  ENT: no runny nose   Cardiovascular:  no chest pain  Resp:  SOB  GI: no vomiting GU: no dysuria Integumentary: no rash  Allergy: no hives  Musculoskeletal: no leg swelling  Neurological: no slurred speech ROS otherwise negative  PAST MEDICAL HISTORY/PAST SURGICAL HISTORY:  Past Medical History  Diagnosis Date  . Breast cancer ([place]) 04/2011    DCIS left breast , ER/PR+  . Hypertension 05/11/2011  . Difficulty sleeping     since dx of cancer    MEDICATIONS:  Prior to Admission medications   Medication Sig Start Date End Date Taking? Authorizing Provider  ibuprofen (ADVIL,MOTRIN) 800 MG tablet  06/08/12   Historical Provider, MD  losartan (COZAAR) 100 MG tablet Take 100 mg by mouth daily.      Historical Provider, MD  tamoxifen (NOLVADEX) 20 MG tablet Take 1 tablet (20 mg total) by mouth daily. 04/28/15   Nicholas Lose, MD    ALLERGIES:  No Known Allergies  SOCIAL HISTORY:  Social History  Substance Use Topics  . Smoking status: Former Smoker -- 1.00 packs/day for 45 years    Types: Cigarettes    Quit date: 11/04/2009  . Smokeless tobacco: Never Used  . Alcohol Use: No    FAMILY HISTORY: Family History  Problem Relation Age of Onset  . Cancer Mother 85    breast ca  .  Alzheimer's disease Father   . Cancer Maternal Aunt     breast  . Cancer Maternal Uncle     breast  . Cancer Cousin     breast    EXAM: BP 138/72 mmHg  Pulse 115  Temp(Src) 101.6 F (38.7 C) (Rectal)  Resp 16  Ht 5\' 6"  (1.676 m)  Wt 245 lb (111.131 kg)  BMI 39.56 kg/m2  SpO2 98% CONSTITUTIONAL: Alert and oriented and responds appropriately to questions. Obese, febrile, tachycardic, nontoxic HEAD: Normocephalic EYES: Conjunctivae clear, PERRL ENT: normal nose; no rhinorrhea; dry mucous membranes NECK: Supple, no meningismus, no LAD  CARD: Regular and tachycardic; S1 and S2 appreciated; no murmurs, no clicks, no rubs, no gallops RESP: Normal chest excursion without splinting or tachypnea; breath sounds equal bilaterally, patient has diffuse expiratory wheezes, no rhonchi or rales, no hypoxia or respiratory distress, speaking full sentences ABD/GI: Normal bowel sounds; non-distended; soft, non-tender, no rebound, no guarding, no peritoneal signs BACK:  The back appears normal and is non-tender to palpation, there is no CVA tenderness EXT: Normal ROM in all joints; non-tender to palpation; no edema; normal capillary refill; no cyanosis, no calf tenderness or swelling    SKIN: Normal color for age and race; warm; no rash NEURO: Moves all extremities equally, sensation to light touch intact diffusely, cranial nerves II through XII intact PSYCH: The patient's  mood and manner are appropriate. Grooming and personal hygiene are appropriate.  MEDICAL DECISION MAKING: Patient here who meets sepsis criteria. She is tachycardic, febrile. Suspect possible pneumonia versus influenza. We'll give DuoNeb, IV fluids, broad-spectrum antibiotics. We'll obtain labs, cultures, urine, chest x-ray. Flu swab also pending. EKG shows sinus tachycardia. There is some mild ST depression diffusely which is likely rate related. She denies chest pain or chest discomfort at this time.   ED PROGRESS: Patient's vital  signs are improving. She does have mild leukocytosis of 10.6. Normal lactate. Negative troponin. Cultures, flu swab pending. Chest x-ray shows peribronchial thickening and bibasilar atelectasis without obvious infiltrate. I still suspect clinically she either has pneumonia versus viral illness. Her lungs have cleared up some after breathing treatment but she is still wheezing. We'll give another DuoNeb. Discussed with patient and her husband about admission versus going home. I do not feel it would be unreasonable to let her go home but she is very worried about feeling short of breath at home. She states that she would like to stay in the hospital overnight for monitoring. She has received broad-spectrum antibiotics. We'll discuss with hospitalist for admission.  1:25 AM  Discussed patient's case with hospitalist, Dr. Myna Hidalgo.  Recommend admission to telemetry, observation bed.  I will place holding orders per their request. Patient and family (if present) updated with plan. Care transferred to hospitalist service.  I reviewed all nursing notes, vitals, pertinent old records, EKGs, labs, imaging (as available).   EKG Interpretation  Date/Time:  Friday September 18 2015 23:00:45 EDT Ventricular Rate:  124 PR Interval:  150 QRS Duration: 91 QT Interval:  301 QTC Calculation: 432 R Axis:   38 Text Interpretation:  Sinus tachycardia Atrial premature complex Nonspecific repol abnormality, diffuse leads Confirmed by WARD,  DO, KRISTEN [id number]) on 09/18/2015 11:04:27 PM        CRITICAL CARE Performed by: Nyra Jabs   Total critical care time: 40 minutes  Critical care time was exclusive of separately billable procedures and treating other patients.  Critical care was necessary to treat or prevent imminent or life-threatening deterioration.  Critical care was time spent personally by me on the following activities: development of treatment plan with patient and/or surrogate as well as nursing,  discussions with consultants, evaluation of patient's response to treatment, examination of patient, obtaining history from patient or surrogate, ordering and performing treatments and interventions, ordering and review of laboratory studies, ordering and review of radiographic studies, pulse oximetry and re-evaluation of patient's condition.   [place], DO 09/19/15 [phone]

## 2015-09-19 ENCOUNTER — Encounter (HOSPITAL_COMMUNITY): Payer: Self-pay | Admitting: Family Medicine

## 2015-09-19 DIAGNOSIS — J9811 Atelectasis: Secondary | ICD-10-CM | POA: Diagnosis not present

## 2015-09-19 DIAGNOSIS — J209 Acute bronchitis, unspecified: Secondary | ICD-10-CM

## 2015-09-19 DIAGNOSIS — D0512 Intraductal carcinoma in situ of left breast: Secondary | ICD-10-CM | POA: Diagnosis not present

## 2015-09-19 DIAGNOSIS — R4589 Other symptoms and signs involving emotional state: Secondary | ICD-10-CM | POA: Diagnosis present

## 2015-09-19 DIAGNOSIS — R509 Fever, unspecified: Secondary | ICD-10-CM | POA: Diagnosis present

## 2015-09-19 DIAGNOSIS — F418 Other specified anxiety disorders: Secondary | ICD-10-CM | POA: Diagnosis present

## 2015-09-19 DIAGNOSIS — I10 Essential (primary) hypertension: Secondary | ICD-10-CM | POA: Diagnosis not present

## 2015-09-19 LAB — COMPREHENSIVE METABOLIC PANEL
ALBUMIN: 3.3 g/dL — AB (ref 3.5–5.0)
ALK PHOS: 67 U/L (ref 38–126)
ALT: 30 U/L (ref 14–54)
ANION GAP: 10 (ref 5–15)
AST: 34 U/L (ref 15–41)
BUN: 12 mg/dL (ref 6–20)
CALCIUM: 9.4 mg/dL (ref 8.9–10.3)
CO2: 24 mmol/L (ref 22–32)
CREATININE: 0.84 mg/dL (ref 0.44–1.00)
Chloride: 110 mmol/L (ref 101–111)
GFR calc Af Amer: >60 mL/min (ref >60)
GFR calc non Af Amer: >60 mL/min (ref >60)
Glucose, Bld: 117 mg/dL — ABNORMAL HIGH (ref 65–99)
Potassium: 3.6 mmol/L (ref 3.5–5.1)
SODIUM: 144 mmol/L (ref 135–145)
TOTAL PROTEIN: 7.4 g/dL (ref 6.5–8.1)
Total Bilirubin: 0.2 mg/dL — ABNORMAL LOW (ref 0.3–1.2)

## 2015-09-19 LAB — INFLUENZA PANEL BY PCR (TYPE A & B)
H1N1FLUPCR: NOT DETECTED
INFLBPCR: NEGATIVE
Influenza A By PCR: NEGATIVE

## 2015-09-19 LAB — URINALYSIS, ROUTINE W REFLEX MICROSCOPIC
BILIRUBIN URINE: NEGATIVE
Glucose, UA: NEGATIVE mg/dL
KETONES UR: NEGATIVE mg/dL
NITRITE: NEGATIVE
PROTEIN: NEGATIVE mg/dL
Specific Gravity, Urine: 1.027 (ref 1.005–1.030)
pH: 6 (ref 5.0–8.0)

## 2015-09-19 LAB — URINE MICROSCOPIC-ADD ON

## 2015-09-19 LAB — PROCALCITONIN

## 2015-09-19 LAB — GLUCOSE, CAPILLARY: Glucose-Capillary: 125 mg/dL — ABNORMAL HIGH (ref 65–99)

## 2015-09-19 LAB — TROPONIN I: Troponin I: <0.03 ng/mL (ref <0.031)

## 2015-09-19 MED ORDER — SODIUM CHLORIDE 0.9 % IV SOLN
INTRAVENOUS | Status: AC
  Administered 2015-09-19: 04:00:00 via INTRAVENOUS

## 2015-09-19 MED ORDER — METHYLPREDNISOLONE SODIUM SUCC 125 MG IJ SOLR
125.0000 mg | Freq: Once | INTRAMUSCULAR | Status: AC
  Administered 2015-09-19: 125 mg via INTRAVENOUS
  Filled 2015-09-19: qty 2

## 2015-09-19 MED ORDER — BISACODYL 5 MG PO TBEC: 5.0000 mg | DELAYED_RELEASE_TABLET | Freq: Every day | ORAL | Status: DC | PRN

## 2015-09-19 MED ORDER — ACETAMINOPHEN 650 MG RE SUPP: 650.0000 mg | Freq: Four times a day (QID) | RECTAL | Status: DC | PRN

## 2015-09-19 MED ORDER — ACETAMINOPHEN 325 MG PO TABS: 650.0000 mg | ORAL_TABLET | Freq: Four times a day (QID) | ORAL | Status: DC | PRN

## 2015-09-19 MED ORDER — PREDNISONE 50 MG PO TABS
50.0000 mg | ORAL_TABLET | Freq: Every day | ORAL | Status: DC
  Administered 2015-09-19 – 2015-09-20 (×2): 50 mg via ORAL
  Filled 2015-09-19 (×2): qty 1

## 2015-09-19 MED ORDER — ACETAMINOPHEN 500 MG PO TABS
1000.0000 mg | ORAL_TABLET | Freq: Once | ORAL | Status: AC
  Administered 2015-09-19: 1000 mg via ORAL
  Filled 2015-09-19: qty 2

## 2015-09-19 MED ORDER — VITAMIN D3 25 MCG (1000 UNIT) PO TABS
1000.0000 [IU] | ORAL_TABLET | Freq: Every day | ORAL | Status: DC
  Administered 2015-09-19 – 2015-09-20 (×2): 1000 [IU] via ORAL
  Filled 2015-09-19 (×2): qty 1

## 2015-09-19 MED ORDER — LOSARTAN POTASSIUM 50 MG PO TABS
100.0000 mg | ORAL_TABLET | Freq: Every day | ORAL | Status: DC
  Administered 2015-09-19 – 2015-09-20 (×2): 100 mg via ORAL
  Filled 2015-09-19 (×2): qty 2

## 2015-09-19 MED ORDER — SODIUM CHLORIDE 0.9% FLUSH
3.0000 mL | Freq: Two times a day (BID) | INTRAVENOUS | Status: DC
  Administered 2015-09-19 (×3): 3 mL via INTRAVENOUS

## 2015-09-19 MED ORDER — TAMOXIFEN CITRATE 10 MG PO TABS
20.0000 mg | ORAL_TABLET | Freq: Every day | ORAL | Status: DC
  Administered 2015-09-19 – 2015-09-20 (×2): 20 mg via ORAL
  Filled 2015-09-19 (×2): qty 2

## 2015-09-19 MED ORDER — POLYETHYLENE GLYCOL 3350 17 G PO PACK: 17.0000 g | PACK | Freq: Every day | ORAL | Status: DC | PRN

## 2015-09-19 MED ORDER — ENOXAPARIN SODIUM 40 MG/0.4ML ~~LOC~~ SOLN
40.0000 mg | SUBCUTANEOUS | Status: DC
  Administered 2015-09-19: 40 mg via SUBCUTANEOUS
  Filled 2015-09-19 (×2): qty 0.4

## 2015-09-19 MED ORDER — FLUTICASONE PROPIONATE 50 MCG/ACT NA SUSP
1.0000 | Freq: Every day | NASAL | Status: DC
  Administered 2015-09-19: 1 via NASAL
  Filled 2015-09-19: qty 16

## 2015-09-19 MED ORDER — ONDANSETRON HCL 4 MG/2ML IJ SOLN: 4.0000 mg | Freq: Four times a day (QID) | INTRAMUSCULAR | Status: DC | PRN

## 2015-09-19 MED ORDER — ONDANSETRON HCL 4 MG PO TABS: 4.0000 mg | ORAL_TABLET | Freq: Four times a day (QID) | ORAL | Status: DC | PRN

## 2015-09-19 MED ORDER — LEVOFLOXACIN IN D5W 750 MG/150ML IV SOLN
750.0000 mg | INTRAVENOUS | Status: DC
  Administered 2015-09-19 (×2): 750 mg via INTRAVENOUS
  Filled 2015-09-19 (×3): qty 150

## 2015-09-19 MED ORDER — IPRATROPIUM-ALBUTEROL 0.5-2.5 (3) MG/3ML IN SOLN
3.0000 mL | RESPIRATORY_TRACT | Status: DC | PRN
  Administered 2015-09-19: 3 mL via RESPIRATORY_TRACT
  Filled 2015-09-19: qty 3

## 2015-09-19 MED ORDER — IPRATROPIUM-ALBUTEROL 0.5-2.5 (3) MG/3ML IN SOLN
3.0000 mL | Freq: Once | RESPIRATORY_TRACT | Status: AC
  Administered 2015-09-19: 3 mL via RESPIRATORY_TRACT
  Filled 2015-09-19: qty 3

## 2015-09-19 NOTE — H&P (Signed)
[facility] History and Physical  Maria Bolton [id number] DOB: 04/11/1946 DOA: 09/18/2015  Referring physician: ED physician PCP: Thressa Sheller, MD  Specialists: Dr. [name] (oncology)   Chief Complaint:  Dyspnea, cough, fever  HPI: Maria Bolton is a 70 y.o. 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 Patient has a 45-pack-year smoking history and quit approximately 5 years ago. She had been in her usual state until 3 weeks ago when she developed dyspnea with audible wheezes and cough. She was evaluated by her primary care physician at that time and prescribed albuterol, Ceftin, and prednisone taper. She enjoyed good improvement initially but developed re-worsening shortly after completing the prednisone taper. For the past several days, the patient is had worsening malaise with body aches, dyspnea, cough occasionally productive of thick white-yellow sputum, and chills. Prior to admission, the patient had progressed to the point of being significantly dyspneic at rest and activated EMS. She was given a nebulized breathing treatment in route to the hospital with some subjective improvement.  In ED, patient was found to be febrile to 38.7 C, tachycardic 120s, saturating adequately on room air, and with stable blood pressure. EKG features a sinus tachycardia with rate 124. Chest x-ray is notable for mild peribronchial thickening and mild bibasilar atelectasis but no focal infiltrate. CMP is largely unremarkable and CBC features a leukocytosis to 10,600. Lactic acid is 1.54 and troponin undetectable. Urine is obtained for analysis and grossly negative for infection. Blood and urine cultures were obtained in the emergency department and 1.5 L of normal saline were bolused. Flu swab was taken, DuoNeb administered 2, and she received empiric  doses of vancomycin and Zosyn. Patient had subjective improvement with these measures in the emergency department but continued to complain of significant dyspnea with minimal exertion and will be admitted to the hospital for ongoing evaluation and management of fevers and dyspnea suspicious for acute bacterial exacerbation of chronic bronchitis.  Where does patient live?   At home    Can patient participate in ADLs?  Yes         Review of Systems:   General: no weight change, poor appetite, or fatigue. Fever, chills.  HEENT: no blurry vision, hearing changes or sore throat Pulm:  Dyspnea, cough, wheeze CV: no chest pain or palpitations Abd: no nausea, vomiting, abdominal pain, diarrhea, or constipation GU: no dysuria, hematuria, increased urinary frequency, or urgency  Ext: no leg edema Neuro: no focal weakness, numbness, or tingling, no vision change or hearing loss Skin: no rash, no wounds MSK: No muscle spasm, no deformity, no red, hot, or swollen joint Heme: No easy bruising or bleeding Travel history: No recent long distant travel    Allergy: No Known Allergies  Past Medical History  Diagnosis Date  . 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 Hypertension 05/11/2011  . Difficulty sleeping     since dx of cancer    Past Surgical History  Procedure Laterality Date  . Hip surgery  1982  . Nevus excision  05/16/2011    Procedure: NEVUS EXCISION;  Surgeon: Shann Medal, MD;  Location: [place];  Service: General;  Laterality: Left;  remove mole left breast  . Breast lumpectomy  05/16/2011    Procedure: LUMPECTOMY;  Surgeon: Shann Medal, MD;  Location: [place];  Service: General;  Laterality: Left;  Needle localization Left breast lumpectomy    Social History:  reports that she quit smoking about 5 years ago. Her smoking use included Cigarettes. She has a 45 pack-year smoking history. She has never used smokeless  tobacco. She reports that she does not drink alcohol or use illicit drugs.  Family History:  Family History  Problem Relation Age of Onset  . Cancer Mother 45    breast ca  . Alzheimer's disease Father   . Cancer Maternal Aunt     breast  . Cancer Maternal Uncle     breast  . Cancer Cousin     breast     Prior to Admission medications   Medication Sig Start Date End Date Taking? Authorizing Provider  albuterol (PROVENTIL HFA;VENTOLIN HFA) 108 (90 Base) MCG/ACT inhaler Inhale 2 puffs into the lungs every 6 (six) hours as needed for wheezing or shortness of breath.   Yes Historical Provider, MD  cefUROXime (CEFTIN) 500 MG tablet Take 500 mg by mouth 2 (two) times daily with a meal.  09/18/15  Yes Historical Provider, MD  cholecalciferol (VITAMIN D) 1000 units tablet Take 1,000 Units by mouth daily.   Yes Historical Provider, MD  fluticasone (FLONASE) 50 MCG/ACT nasal spray Place 1 spray into both nostrils daily.   Yes Historical Provider, MD  losartan (COZAAR) 100 MG tablet Take 100 mg by mouth daily.     Yes Historical Provider, MD  tamoxifen (NOLVADEX) 20 MG tablet Take 1 tablet (20 mg total) by mouth daily. 04/28/15  Yes Nicholas Lose, MD    Physical Exam: Filed Vitals:   09/19/15 0130 09/19/15 0145 09/19/15 0200 09/19/15 0215  BP: 112/59 89/52 96/51  109/58  Pulse: 106 101 101 95  Temp:      TempSrc:      Resp: 19 16 14 17   Height:      Weight:      SpO2: 96% 99% 95% 97%   General: In mild respiratory distress with increased work of breathing HEENT:       Eyes: PERRL, EOMI, no scleral icterus or conjunctival pallor.       ENT: No discharge from the ears or nose, no pharyngeal ulcers         Neck: No JVD, no bruit, no appreciable mass Heme: No cervical adenopathy, no pallor Cardiac: Rate ~120 and regular. No murmurs, No gallops or rubs. Pulm: Good air movement bilaterally.  Wheezes, scattered rhonchi.  Abd: Soft, nondistended, nontender, no rebound pain or gaurding, BS  present. Ext: No LE edema bilaterally. 2+DP/PT pulse bilaterally. Musculoskeletal: No gross deformity, no red, hot, swollen joints  Skin: No rashes or wounds on exposed surfaces  Neuro: Alert, oriented X3, cranial nerves II-XII grossly intact. No focal findings Psych: Patient is not overtly psychotic, appropriate mood and affect.  Labs on Admission:  Basic Metabolic Panel:  Recent Labs Lab 09/18/15 2315 09/18/15 2329  NA 144 141  K 3.6 3.6  CL 110 105  CO2 24  --   GLUCOSE 117* 113*  BUN 12 12  CREATININE 0.84 0.70  CALCIUM 9.4  --    Liver Function Tests:  Recent Labs Lab 09/18/15 2315  AST 34  ALT 30  ALKPHOS 67  BILITOT 0.2*  PROT 7.4  ALBUMIN 3.3*   No results for input(s): LIPASE, AMYLASE in the last 168 hours. No results for input(s): AMMONIA in the last 168 hours. CBC:  Recent Labs Lab 09/18/15 2315 09/18/15 2329  WBC 10.6*  --   NEUTROABS 5.9  --   HGB 12.4 13.9  HCT 37.3 41.0  MCV 82.9  --   PLT 200  --    Cardiac Enzymes:  Recent Labs Lab 09/18/15 2315  TROPONINI <0.03    BNP (last 3 results) No results for input(s): BNP in the last 8760 hours.  ProBNP (last 3 results) No results for input(s): PROBNP in the last 8760 hours.  CBG: No results for input(s): GLUCAP in the last 168 hours.  Radiological Exams on Admission: Dg Chest Port 1 View  09/19/2015  CLINICAL DATA:  Acute onset of generalized chest pain and shortness of breath. Initial encounter. EXAM: PORTABLE CHEST 1 VIEW COMPARISON:  Chest radiograph performed 08/25/2015 FINDINGS: The lungs are well-aerated. Mild peribronchial thickening is noted. Mild bibasilar atelectasis is seen. There is no evidence of pleural effusion or pneumothorax. The cardiomediastinal silhouette is borderline normal in size. No acute osseous abnormalities are seen. IMPRESSION: Mild peribronchial thickening noted. Mild bibasilar atelectasis seen. Electronically Signed   By: Garald Balding M.D.   On: 09/19/2015  00:19    EKG: Independently reviewed.  Abnormal findings:  Sinus tachycardia (rate 124)   Assessment/Plan  1. Acute bacterial exacerbation of chronic bronchitis - Flu swab pending, will maintain droplet isolation for now and d/c if negative  - Saturating adequately on rm air, but continues to rely on significant accessory muscle use  - CXR findings suggestive of acute bronchitis - Blood cultures incubating, will follow and tailor abx as indicated - Empiric vanc and Zosyn given in ED, will continue with Levaquin only  - Continue DuoNeb q4h prn  - Solu-Medrol 125 mg IVP given in ED, will continue systemic steroids with prednisone  - Supplemental O2 as needed to keep sats >92%   2. Hypertension  - Managed with losartan at home - At goal currently  - Continue losartan   3. Breast cancer  - Left breast DCIS diagnosed in 2012  - Underwent lumpectomy in December 2012, followed by RT  - Has been on tamoxifen since April 2013, will continue   DVT ppx:  SQ Lovenox    Code Status: Full code Family Communication:  Yes, patient's significant other at bed side Disposition Plan: Admit to inpatient   Date of Service 09/19/2015    Vianne Bulls, MD [facility] Pager [phone]  If 7PM-7AM, please contact night-coverage [email or web address] Password [facility] 09/19/2015, 2:38 AM

## 2015-09-19 NOTE — Progress Notes (Signed)
[facility] PROGRESS NOTE  [name] [id number] DOB: 09-Jan-1946 DOA: 09/18/2015 PCP: Thressa Sheller, MD  Assessment/Plan: 1. Probable acute bacterial bronchitis -She presents with complaints of worsening shortness of breath, cough, productive sputum. Initial workup included chest x-ray that showed peribronchial thickening -Suspect acute bacterial bronchitis, plan to continue Levaquin 750 mg IV every 24 hours -Repeat chest x-ray in am to ensure stability  2.  Probable COPD exacerbation -Suspect underlying infection precipitating COPD exacerbation -Plan to continue nebulizer treatments and systemic steroids -She is showing clinical improvement  Code Status: Full code Family Communication: Spoke with her husband was present at bedside Disposition Plan: Will monitor for the next 24 hours, anticipate discharge in the next 1-2 days   Antibiotics:  Levaquin  HPI/Subjective: [name] is a 70 year old female with a history of breast cancer, admitted to medicine service on 09/19/2015 when she presented with complaints of cough, fever, wheezing. Chest x-ray showed peribronchial thickening. Symptoms felt to be due to COPD exacerbation with acute bacterial bronchitis. She was started on systemic steroids and empiric IV antibiotic therapy.  Objective: Filed Vitals:   09/19/15 0329 09/19/15 1500  BP: 130/70 124/62  Pulse: 93 99  Temp: 97.7 F (36.5 C) 97.8 F (36.6 C)  Resp: 18 18    Intake/Output Summary (Last 24 hours) at 09/19/15 1524 Last data filed at 09/19/15 1200  Gross per 24 hour  Intake 851.67 ml  Output      0 ml  Net 851.67 ml   Filed Weights   09/18/15 2326 09/19/15 0329  Weight: 111.131 kg (245 lb) 114.533 kg (252 lb 8 oz)    Exam:   General:  No acute distress, states feeling little better today.  Cardiovascular: Regular rate and rhythm normal S1-S2  Respiratory: Diminished breath since bilaterally having a few 3 wheezes, normal respiratory  effort  Abdomen: Soft nontender nondistended  Musculoskeletal: No edema  Data Reviewed: Basic Metabolic Panel:  Recent Labs Lab 09/18/15 2315 09/18/15 2329  NA 144 141  K 3.6 3.6  CL 110 105  CO2 24  --   GLUCOSE 117* 113*  BUN 12 12  CREATININE 0.84 0.70  CALCIUM 9.4  --    Liver Function Tests:  Recent Labs Lab 09/18/15 2315  AST 34  ALT 30  ALKPHOS 67  BILITOT 0.2*  PROT 7.4  ALBUMIN 3.3*   No results for input(s): LIPASE, AMYLASE in the last 168 hours. No results for input(s): AMMONIA in the last 168 hours. CBC:  Recent Labs Lab 09/18/15 2315 09/18/15 2329  WBC 10.6*  --   NEUTROABS 5.9  --   HGB 12.4 13.9  HCT 37.3 41.0  MCV 82.9  --   PLT 200  --    Cardiac Enzymes:  Recent Labs Lab 09/18/15 2315  TROPONINI <0.03   BNP (last 3 results) No results for input(s): BNP in the last 8760 hours.  ProBNP (last 3 results) No results for input(s): PROBNP in the last 8760 hours.  CBG:  Recent Labs Lab 09/19/15 0809  GLUCAP 125*    No results found for this or any previous visit (from the past 240 hour(s)).   Studies: Dg Chest Port 1 View  09/19/2015  CLINICAL DATA:  Acute onset of generalized chest pain and shortness of breath. Initial encounter. EXAM: PORTABLE CHEST 1 VIEW COMPARISON:  Chest radiograph performed 08/25/2015 FINDINGS: The lungs are well-aerated. Mild peribronchial thickening is noted. Mild bibasilar atelectasis is seen. There is no evidence of pleural effusion or pneumothorax.  The cardiomediastinal silhouette is borderline normal in size. No acute osseous abnormalities are seen. IMPRESSION: Mild peribronchial thickening noted. Mild bibasilar atelectasis seen. Electronically Signed   By: Garald Balding M.D.   On: 09/19/2015 00:19    Scheduled Meds: . sodium chloride   Intravenous STAT  . cholecalciferol  1,000 Units Oral Daily  . enoxaparin (LOVENOX) injection  40 mg Subcutaneous Q24H  . fluticasone  1 spray Each Nare Daily  .  levofloxacin (LEVAQUIN) IV  750 mg Intravenous Q24H  . losartan  100 mg Oral Daily  . predniSONE  50 mg Oral Q breakfast  . sodium chloride flush  3 mL Intravenous Q12H  . tamoxifen  20 mg Oral Daily   Continuous Infusions:   Principal Problem:   Acute bronchitis Active Problems:   Ductal carcinoma in situ (DCIS) of left breast   Hypertension   Fever   Anxiety about health    Time spent:     Kelvin Cellar  [facility] Pager [phone]. If 7PM-7AM, please contact night-coverage at [email or web address], password [facility] 09/19/2015, 3:24 PM

## 2015-09-19 NOTE — Progress Notes (Signed)
Flu screen negative. No respiratory panel sent. Droplet precautions d/c'ed. Patient verbalizes understanding. Maria Bolton. Brigitte Pulse, RN

## 2015-09-20 ENCOUNTER — Observation Stay (HOSPITAL_COMMUNITY): Payer: Medicare Other

## 2015-09-20 DIAGNOSIS — J209 Acute bronchitis, unspecified: Secondary | ICD-10-CM | POA: Diagnosis not present

## 2015-09-20 DIAGNOSIS — R05 Cough: Secondary | ICD-10-CM | POA: Diagnosis not present

## 2015-09-20 DIAGNOSIS — J441 Chronic obstructive pulmonary disease with (acute) exacerbation: Secondary | ICD-10-CM | POA: Diagnosis not present

## 2015-09-20 DIAGNOSIS — R509 Fever, unspecified: Secondary | ICD-10-CM | POA: Diagnosis not present

## 2015-09-20 LAB — GLUCOSE, CAPILLARY: Glucose-Capillary: 74 mg/dL (ref 65–99)

## 2015-09-20 LAB — URINE CULTURE: Culture: 6000 — AB

## 2015-09-20 MED ORDER — PREDNISONE 10 MG (21) PO TBPK: ORAL_TABLET | ORAL | Status: DC

## 2015-09-20 MED ORDER — FLUTICASONE PROPIONATE 50 MCG/ACT NA SUSP: 1.0000 | Freq: Every day | NASAL | Status: DC

## 2015-09-20 MED ORDER — LEVOFLOXACIN 750 MG PO TABS: 750.0000 mg | ORAL_TABLET | Freq: Every day | ORAL | Status: DC

## 2015-09-20 NOTE — Progress Notes (Signed)
Reviewed discharge information with patient and caregiver. Answered all questions. Patient/caregiver able to teach back medications and reasons to contact MD/911. Patient verbalizes importance of PCP follow up appointment.  Nell Schrack M. Anacarolina Evelyn, RN  

## 2015-09-20 NOTE — Discharge Summary (Signed)
Physician Discharge Summary  Maria Bolton [id number] DOB: [birth year] DOA: 09/18/2015  PCP: Thressa Sheller, MD  Admit date: 09/18/2015 Discharge date: 09/20/2015  Time spent: 35 minutes  Recommendations for Outpatient Follow-up:  1. Please follow-up on respiratory status, she was admitted for COPD exacerbation and acute bronchitis   Discharge Diagnoses:  Principal Problem:   Acute bronchitis Active Problems:   Ductal carcinoma in situ (DCIS) of left breast   Hypertension   Fever   Anxiety about health   Discharge Condition: Stable  Diet recommendation: Regular diet  Filed Weights   09/18/15 2326 09/19/15 0329  Weight: 111.131 kg (245 lb) 114.533 kg (252 lb 8 oz)    History of present illness:  Maria Bolton is a 70 y.o. 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 Patient has a 45-pack-year smoking history and quit approximately 5 years ago. She had been in her usual state until 3 weeks ago when she developed dyspnea with audible wheezes and cough. She was evaluated by her primary care physician at that time and prescribed albuterol, Ceftin, and prednisone taper. She enjoyed good improvement initially but developed re-worsening shortly after completing the prednisone taper. For the past several days, the patient is had worsening malaise with body aches, dyspnea, cough occasionally productive of thick white-yellow sputum, and chills. Prior to admission, the patient had progressed to the point of being significantly dyspneic at rest and activated EMS. She was given a nebulized breathing treatment in route to the hospital with some subjective improvement.  In ED, patient was found to be febrile to 38.7 C, tachycardic 120s, saturating adequately on room air, and with stable blood pressure. EKG features a sinus tachycardia with  rate 124. Chest x-ray is notable for mild peribronchial thickening and mild bibasilar atelectasis but no focal infiltrate. CMP is largely unremarkable and CBC features a leukocytosis to 10,600. Lactic acid is 1.54 and troponin undetectable. Urine is obtained for analysis and grossly negative for infection. Blood and urine cultures were obtained in the emergency department and 1.5 L of normal saline were bolused. Flu swab was taken, DuoNeb administered 2, and she received empiric doses of vancomycin and Zosyn. Patient had subjective improvement with these measures in the emergency department but continued to complain of significant dyspnea with minimal exertion and will be admitted to the hospital for ongoing evaluation and management of fevers and dyspnea suspicious for acute bacterial exacerbation of chronic bronchitis.  Hospital Course:  [name] is a 70 year old female with a history of breast cancer, admitted to medicine service on 09/19/2015 when she presented with complaints of cough, fever, wheezing. Chest x-ray showed peribronchial thickening. Symptoms felt to be due to COPD exacerbation with acute bacterial bronchitis. She was started on systemic steroids and empiric IV antibiotic therapy with Levaquin. She showed gradual clinical improvement. By 09/20/2015 she was satting 99% on room air, ambulated down the hallway having ambulatory oxygen saturation of 97%. She stated feeling much better and felt wanted to go home. She was sent home on a prednisone taper along with oral Levaquin.   Discharge Exam: Filed Vitals:   09/19/15 2202 09/20/15 0458  BP: 140/80 123/59  Pulse:  76  Temp:  98 F (36.7 C)  Resp:  18    General: Awake and alert, no acute distress, she was ambulated down the hallway Cardiovascular: Regular rate and rhythm normal S1-S2  Respiratory: Interim improvement in lung exam, lungs overall clear to auscultation bilaterally, good air movement, no wheezes Abdomen: Soft nontender  nondistended  Discharge Instructions   Discharge Instructions    Call MD for:  difficulty breathing, headache or visual disturbances    Complete by:  As directed      Call MD for:  extreme fatigue    Complete by:  As directed      Call MD for:  hives    Complete by:  As directed      Call MD for:  persistant dizziness or light-headedness    Complete by:  As directed      Call MD for:  persistant nausea and vomiting    Complete by:  As directed      Call MD for:  redness, tenderness, or signs of infection (pain, swelling, redness, odor or green/yellow discharge around incision site)    Complete by:  As directed      Call MD for:  severe uncontrolled pain    Complete by:  As directed      Call MD for:  temperature >100.4    Complete by:  As directed      Call MD for:    Complete by:  As directed      Diet - low sodium heart healthy    Complete by:  As directed      Increase activity slowly    Complete by:  As directed           Current Discharge Medication List    START taking these medications   Details  levofloxacin (LEVAQUIN) 750 MG tablet Take 1 tablet (750 mg total) by mouth daily. Qty: 2 tablet, Refills: 0    predniSONE (STERAPRED UNI-PAK 21 TAB) 10 MG (21) TBPK tablet Take 6-5-4-3-2-1 tablets by mouth daily till gone. Qty: 21 tablet, Refills: 0      CONTINUE these medications which have CHANGED   Details  fluticasone (FLONASE) 50 MCG/ACT nasal spray Place 1 spray into both nostrils daily. Qty: 16 g, Refills: 2      CONTINUE these medications which have NOT CHANGED   Details  albuterol (PROVENTIL HFA;VENTOLIN HFA) 108 (90 Base) MCG/ACT inhaler Inhale 2 puffs into the lungs every 6 (six) hours as needed for wheezing or shortness of breath.    cholecalciferol (VITAMIN D) 1000 units tablet Take 1,000 Units by mouth daily.    losartan (COZAAR) 100 MG tablet Take 100 mg by mouth daily.     Associated Diagnoses: Breast cancer ([place]); Hypertension; DCIS (ductal  carcinoma in situ)    tamoxifen (NOLVADEX) 20 MG tablet Take 1 tablet (20 mg total) by mouth daily. Qty: 90 tablet, Refills: 3   Associated Diagnoses: Ductal carcinoma in situ (DCIS) of left breast      STOP taking these medications     cefUROXime (CEFTIN) 500 MG tablet        No Known Allergies Follow-up Information    Follow up with Thressa Sheller, MD In 1 week.   Specialty:  Internal Medicine   Contact information:   [address] [place] [address] [phone]        The results of significant diagnostics from this hospitalization (including imaging, microbiology, ancillary and laboratory) are listed below for reference.    Significant Diagnostic Studies: Dg Chest Port 1 View  09/19/2015  CLINICAL DATA:  Acute onset of generalized chest pain and shortness of breath. Initial encounter. EXAM: PORTABLE CHEST 1 VIEW COMPARISON:  Chest radiograph performed 08/25/2015 FINDINGS: The lungs are well-aerated. Mild peribronchial thickening is noted. Mild bibasilar atelectasis is seen. There is no evidence of pleural effusion or pneumothorax. The cardiomediastinal silhouette is borderline normal in size. No acute osseous abnormalities are seen. IMPRESSION: Mild peribronchial thickening noted. Mild bibasilar atelectasis seen. Electronically Signed   By: Garald Balding M.D.   On: 09/19/2015 00:19    Microbiology: No results found for this or any previous visit (from the past 240 hour(s)).   Labs: Basic Metabolic Panel:  Recent Labs Lab 09/18/15 2315 09/18/15 2329  NA 144 141  K 3.6 3.6  CL 110 105  CO2 24  --   GLUCOSE 117* 113*  BUN 12 12  CREATININE 0.84 0.70  CALCIUM 9.4  --    Liver Function Tests:  Recent Labs Lab 09/18/15 2315  AST 34  ALT 30  ALKPHOS 67  BILITOT 0.2*  PROT 7.4  ALBUMIN 3.3*   No results for input(s): LIPASE, AMYLASE in the last 168 hours. No results for input(s): AMMONIA in the last 168 hours. CBC:  Recent  Labs Lab 09/18/15 2315 09/18/15 2329  WBC 10.6*  --   NEUTROABS 5.9  --   HGB 12.4 13.9  HCT 37.3 41.0  MCV 82.9  --   PLT 200  --    Cardiac Enzymes:  Recent Labs Lab 09/18/15 2315  TROPONINI <0.03   BNP: BNP (last 3 results) No results for input(s): BNP in the last 8760 hours.  ProBNP (last 3 results) No results for input(s): PROBNP in the last 8760 hours.  CBG:  Recent Labs Lab 09/19/15 0809 09/20/15 0727  GLUCAP 125* 74       Signed:  Kelvin Cellar MD.  [facility] 09/20/2015, 9:50 AM

## 2015-09-23 DIAGNOSIS — J188 Other pneumonia, unspecified organism: Secondary | ICD-10-CM | POA: Diagnosis not present

## 2015-09-23 DIAGNOSIS — R05 Cough: Secondary | ICD-10-CM | POA: Diagnosis not present

## 2015-09-23 DIAGNOSIS — I129 Hypertensive chronic kidney disease with stage 1 through stage 4 chronic kidney disease, or unspecified chronic kidney disease: Secondary | ICD-10-CM | POA: Diagnosis not present

## 2015-09-24 LAB — CULTURE, BLOOD (ROUTINE X 2)
CULTURE: NO GROWTH
Culture: NO GROWTH

## 2015-09-25 ENCOUNTER — Encounter: Payer: Self-pay | Admitting: Hematology and Oncology

## 2015-09-25 ENCOUNTER — Telehealth: Payer: Self-pay | Admitting: Hematology and Oncology

## 2015-09-25 ENCOUNTER — Ambulatory Visit (HOSPITAL_BASED_OUTPATIENT_CLINIC_OR_DEPARTMENT_OTHER): Payer: Medicare Other | Admitting: Hematology and Oncology

## 2015-09-25 VITALS — BP 147/79 | HR 97 | Temp 97.8°F | Resp 20 | Wt 249.7 lb

## 2015-09-25 DIAGNOSIS — Z79811 Long term (current) use of aromatase inhibitors: Secondary | ICD-10-CM | POA: Diagnosis not present

## 2015-09-25 DIAGNOSIS — D0512 Intraductal carcinoma in situ of left breast: Secondary | ICD-10-CM

## 2015-09-25 NOTE — Telephone Encounter (Signed)
appt made and avs printed °

## 2015-09-25 NOTE — Assessment & Plan Note (Signed)
Left breast DCIS status post lumpectomy 05/16/2011, intermediate grade, 0.17 cm, ER 99%, PR to person, Tis N0 stage 0, adjuvant radiation therapy by Dr. Murray, genetic testing negative for BRCA1 and 2, tamoxifen 20 mg started 09/27/2011.  Tamoxifen toxicities: 1. Occasional hot flashes. She finally stopped Effexor because it was not helping. 2. Occasional myalgias  Breast cancer surveillance: 1. Breast exam 03/12/2015 is normal, very tender to palpation on the left breast 2. Mammogram 05/05/2014 is normal (breast density category B)  Return to clinic in 1 year for follow-up 

## 2015-09-25 NOTE — Progress Notes (Signed)
Patient Care Team: Thressa Sheller, MD as PCP - General (Internal Medicine) Thressa Sheller, MD as Attending Physician (Internal Medicine) Lindwood Coke, MD (Dermatology) Consuela Mimes, MD as Consulting Physician (Medical Oncology) Arloa Koh, MD as Consulting Physician (Radiation Oncology) Alphonsa Overall, MD as Consulting Physician (General Surgery)  DIAGNOSIS: Ductal carcinoma in situ (DCIS) of left breast   Staging form: Breast, AJCC 7th Edition     Clinical: Stage 0 (Tis (DCIS), NX, cM0) - Signed by Deatra Robinson, MD on 08/11/2013     Pathologic: No stage assigned - Unsigned   CHIEF COMPLIANT: Follow-up of tamoxifen for DCIS  INTERVAL HISTORY: Maria Bolton is a 70 year old with above-mentioned history of DCIS currently on tamoxifen therapy. She was recently hospitalized for acute bronchitis and was treated with antibiotics and got better. She feels fine now. She denies any lumps or nodules in the breast. She denies any major side effects of tamoxifen. She does have occasional hot flashes.  REVIEW OF SYSTEMS:   Constitutional: Denies fevers, chills or abnormal weight loss Eyes: Denies blurriness of vision Ears, nose, mouth, throat, and face: Denies mucositis or sore throat Respiratory: Denies cough, dyspnea or wheezes Cardiovascular: Denies palpitation, chest discomfort Gastrointestinal:  Denies nausea, heartburn or change in bowel habits Skin: Denies abnormal skin rashes Lymphatics: Denies new lymphadenopathy or easy bruising Neurological:Denies numbness, tingling or new weaknesses Behavioral/Psych: Mood is stable, no new changes  Extremities: No lower extremity edema Breast:  denies any pain or lumps or nodules in either breasts All other systems were reviewed with the patient and are negative.  I have reviewed the past medical history, past surgical history, social history and family history with the patient and they are unchanged from previous note.  ALLERGIES:  has No  Known Allergies.  MEDICATIONS:  Current Outpatient Prescriptions  Medication Sig Dispense Refill  . albuterol (PROVENTIL HFA;VENTOLIN HFA) 108 (90 Base) MCG/ACT inhaler Inhale 2 puffs into the lungs every 6 (six) hours as needed for wheezing or shortness of breath.    . cholecalciferol (VITAMIN D) 1000 units tablet Take 1,000 Units by mouth daily.    . fluticasone (FLONASE) 50 MCG/ACT nasal spray Place 1 spray into both nostrils daily. 16 g 2  . levofloxacin (LEVAQUIN) 750 MG tablet Take 1 tablet (750 mg total) by mouth daily. 2 tablet 0  . losartan (COZAAR) 100 MG tablet Take 100 mg by mouth daily.      . predniSONE (STERAPRED UNI-PAK 21 TAB) 10 MG (21) TBPK tablet Take 6-5-4-3-2-1 tablets by mouth daily till gone. 21 tablet 0  . tamoxifen (NOLVADEX) 20 MG tablet Take 1 tablet (20 mg total) by mouth daily. 90 tablet 3   No current facility-administered medications for this visit.    PHYSICAL EXAMINATION: ECOG PERFORMANCE STATUS: 0 - Asymptomatic  Filed Vitals:   09/25/15 0955  BP: 147/79  Pulse: 97  Temp: 97.8 F (36.6 C)  Resp: 20   Filed Weights   09/25/15 0955  Weight: 249 lb 11.2 oz (113.263 kg)    GENERAL:alert, no distress and comfortable SKIN: skin color, texture, turgor are normal, no rashes or significant lesions EYES: normal, Conjunctiva are pink and non-injected, sclera clear OROPHARYNX:no exudate, no erythema and lips, buccal mucosa, and tongue normal  NECK: supple, thyroid normal size, non-tender, without nodularity LYMPH:  no palpable lymphadenopathy in the cervical, axillary or inguinal LUNGS: clear to auscultation and percussion with normal breathing effort HEART: regular rate & rhythm and no murmurs and no lower extremity edema ABDOMEN:abdomen  soft, non-tender and normal bowel sounds MUSCULOSKELETAL:no cyanosis of digits and no clubbing  NEURO: alert & oriented x 3 with fluent speech, no focal motor/sensory deficits EXTREMITIES: No lower extremity  edema BREAST: No palpable masses or nodules in either right or left breasts. No palpable axillary supraclavicular or infraclavicular adenopathy no breast tenderness or nipple discharge. (exam performed in the presence of a chaperone)  LABORATORY DATA:  I have reviewed the data as listed   Chemistry      Component Value Date/Time   NA 141 09/18/2015 2329   NA 144 02/25/2014 0931   K 3.6 09/18/2015 2329   K 3.7 02/25/2014 0931   CL 105 09/18/2015 2329   CL 109* 07/30/2012 0824   CO2 24 09/18/2015 2315   CO2 25 02/25/2014 0931   BUN 12 09/18/2015 2329   BUN 13.7 02/25/2014 0931   CREATININE 0.70 09/18/2015 2329   CREATININE 0.8 02/25/2014 0931      Component Value Date/Time   CALCIUM 9.4 09/18/2015 2315   CALCIUM 8.7 02/25/2014 0931   ALKPHOS 67 09/18/2015 2315   ALKPHOS 64 02/25/2014 0931   AST 34 09/18/2015 2315   AST 20 02/25/2014 0931   ALT 30 09/18/2015 2315   ALT 13 02/25/2014 0931   BILITOT 0.2* 09/18/2015 2315   BILITOT 0.36 02/25/2014 0931       Lab Results  Component Value Date   WBC 10.6* 09/18/2015   HGB 13.9 09/18/2015   HCT 41.0 09/18/2015   MCV 82.9 09/18/2015   PLT 200 09/18/2015   NEUTROABS 5.9 09/18/2015     ASSESSMENT & PLAN:  Ductal carcinoma in situ (DCIS) of left breast Left breast DCIS status post lumpectomy 05/16/2011, intermediate grade, 0.17 cm, ER 99%, PR to person, Tis N0 stage 0, adjuvant radiation therapy by Dr. Valere Dross, genetic testing negative for BRCA1 and 2, tamoxifen 20 mg started 09/27/2011. Patient will complete 5 years of therapy by April 2018. She will then discontinue this treatment.  Tamoxifen toxicities: 1. Occasional hot flashes. She finally stopped Effexor because it was not helping. 2. Occasional myalgias  Breast cancer surveillance: 1. Breast exam 09/25/2015 is normal, very tender to palpation on the left breast 2. Mammogram December 2016 is normal (breast density category B)  Return to clinic in 1 year for  follow-up With survivorship clinic   No orders of the defined types were placed in this encounter.   The patient has a good understanding of the overall plan. she agrees with it. she will call with any problems that may develop before the next visit here.   Rulon Eisenmenger, MD 09/25/2015

## 2015-09-30 DIAGNOSIS — J449 Chronic obstructive pulmonary disease, unspecified: Secondary | ICD-10-CM | POA: Diagnosis not present

## 2015-09-30 DIAGNOSIS — J188 Other pneumonia, unspecified organism: Secondary | ICD-10-CM | POA: Diagnosis not present

## 2015-09-30 DIAGNOSIS — J189 Pneumonia, unspecified organism: Secondary | ICD-10-CM | POA: Diagnosis not present

## 2015-11-26 DIAGNOSIS — I5032 Chronic diastolic (congestive) heart failure: Secondary | ICD-10-CM | POA: Diagnosis not present

## 2016-02-01 DIAGNOSIS — R600 Localized edema: Secondary | ICD-10-CM | POA: Diagnosis not present

## 2016-02-01 DIAGNOSIS — I5030 Unspecified diastolic (congestive) heart failure: Secondary | ICD-10-CM | POA: Diagnosis not present

## 2016-02-18 DIAGNOSIS — R0609 Other forms of dyspnea: Secondary | ICD-10-CM | POA: Diagnosis not present

## 2016-02-18 DIAGNOSIS — Z923 Personal history of irradiation: Secondary | ICD-10-CM | POA: Diagnosis not present

## 2016-02-18 DIAGNOSIS — I5032 Chronic diastolic (congestive) heart failure: Secondary | ICD-10-CM | POA: Diagnosis not present

## 2016-02-18 DIAGNOSIS — R600 Localized edema: Secondary | ICD-10-CM | POA: Diagnosis not present

## 2016-02-29 DIAGNOSIS — R0602 Shortness of breath: Secondary | ICD-10-CM | POA: Diagnosis not present

## 2016-02-29 DIAGNOSIS — I10 Essential (primary) hypertension: Secondary | ICD-10-CM | POA: Diagnosis not present

## 2016-02-29 DIAGNOSIS — I739 Peripheral vascular disease, unspecified: Secondary | ICD-10-CM | POA: Diagnosis not present

## 2016-03-09 DIAGNOSIS — I739 Peripheral vascular disease, unspecified: Secondary | ICD-10-CM | POA: Diagnosis not present

## 2016-03-09 DIAGNOSIS — R600 Localized edema: Secondary | ICD-10-CM | POA: Diagnosis not present

## 2016-03-11 DIAGNOSIS — R7309 Other abnormal glucose: Secondary | ICD-10-CM | POA: Diagnosis not present

## 2016-03-11 DIAGNOSIS — E559 Vitamin D deficiency, unspecified: Secondary | ICD-10-CM | POA: Diagnosis not present

## 2016-03-11 DIAGNOSIS — M109 Gout, unspecified: Secondary | ICD-10-CM | POA: Diagnosis not present

## 2016-03-11 DIAGNOSIS — I129 Hypertensive chronic kidney disease with stage 1 through stage 4 chronic kidney disease, or unspecified chronic kidney disease: Secondary | ICD-10-CM | POA: Diagnosis not present

## 2016-03-16 DIAGNOSIS — R600 Localized edema: Secondary | ICD-10-CM | POA: Diagnosis not present

## 2016-03-16 DIAGNOSIS — I5032 Chronic diastolic (congestive) heart failure: Secondary | ICD-10-CM | POA: Diagnosis not present

## 2016-03-16 DIAGNOSIS — Z923 Personal history of irradiation: Secondary | ICD-10-CM | POA: Diagnosis not present

## 2016-03-16 DIAGNOSIS — R0609 Other forms of dyspnea: Secondary | ICD-10-CM | POA: Diagnosis not present

## 2016-03-28 DIAGNOSIS — R740 Nonspecific elevation of levels of transaminase and lactic acid dehydrogenase [LDH]: Secondary | ICD-10-CM | POA: Diagnosis not present

## 2016-03-28 DIAGNOSIS — E0590 Thyrotoxicosis, unspecified without thyrotoxic crisis or storm: Secondary | ICD-10-CM | POA: Diagnosis not present

## 2016-03-28 DIAGNOSIS — I129 Hypertensive chronic kidney disease with stage 1 through stage 4 chronic kidney disease, or unspecified chronic kidney disease: Secondary | ICD-10-CM | POA: Diagnosis not present

## 2016-03-28 DIAGNOSIS — Z0000 Encounter for general adult medical examination without abnormal findings: Secondary | ICD-10-CM | POA: Diagnosis not present

## 2016-03-29 ENCOUNTER — Other Ambulatory Visit: Payer: Self-pay | Admitting: Internal Medicine

## 2016-03-29 DIAGNOSIS — R740 Nonspecific elevation of levels of transaminase and lactic acid dehydrogenase [LDH]: Principal | ICD-10-CM

## 2016-03-29 DIAGNOSIS — R7401 Elevation of levels of liver transaminase levels: Secondary | ICD-10-CM

## 2016-04-08 ENCOUNTER — Ambulatory Visit
Admission: RE | Admit: 2016-04-08 | Discharge: 2016-04-08 | Disposition: A | Discharge status: Home / Self Care | Payer: Medicare Other | Source: Ambulatory Visit | Attending: Internal Medicine | Admitting: Internal Medicine

## 2016-04-08 DIAGNOSIS — K8020 Calculus of gallbladder without cholecystitis without obstruction: Secondary | ICD-10-CM | POA: Diagnosis not present

## 2016-04-08 DIAGNOSIS — R7401 Elevation of levels of liver transaminase levels: Secondary | ICD-10-CM

## 2016-04-08 DIAGNOSIS — R740 Nonspecific elevation of levels of transaminase and lactic acid dehydrogenase [LDH]: Principal | ICD-10-CM

## 2016-04-17 ENCOUNTER — Other Ambulatory Visit: Payer: Self-pay | Admitting: Hematology and Oncology

## 2016-04-17 DIAGNOSIS — D0512 Intraductal carcinoma in situ of left breast: Secondary | ICD-10-CM

## 2016-04-18 ENCOUNTER — Other Ambulatory Visit: Payer: Self-pay

## 2016-04-18 DIAGNOSIS — D0512 Intraductal carcinoma in situ of left breast: Secondary | ICD-10-CM

## 2016-04-18 MED ORDER — TAMOXIFEN CITRATE 20 MG PO TABS: 20.0000 mg | ORAL_TABLET | Freq: Every day | ORAL | 3 refills | Status: DC

## 2016-05-17 DIAGNOSIS — R0609 Other forms of dyspnea: Secondary | ICD-10-CM | POA: Diagnosis not present

## 2016-05-17 DIAGNOSIS — I5032 Chronic diastolic (congestive) heart failure: Secondary | ICD-10-CM | POA: Diagnosis not present

## 2016-05-17 DIAGNOSIS — R600 Localized edema: Secondary | ICD-10-CM | POA: Diagnosis not present

## 2016-05-17 DIAGNOSIS — Z923 Personal history of irradiation: Secondary | ICD-10-CM | POA: Diagnosis not present

## 2016-06-13 DIAGNOSIS — M25561 Pain in right knee: Secondary | ICD-10-CM | POA: Diagnosis not present

## 2016-07-21 DIAGNOSIS — Z853 Personal history of malignant neoplasm of breast: Secondary | ICD-10-CM | POA: Diagnosis not present

## 2016-08-02 NOTE — Progress Notes (Signed)
Received 3d mm from [facility]. Upcoming appt scheduled.

## 2016-08-19 ENCOUNTER — Other Ambulatory Visit: Payer: Self-pay | Admitting: Cardiology

## 2016-08-19 DIAGNOSIS — I5032 Chronic diastolic (congestive) heart failure: Secondary | ICD-10-CM | POA: Diagnosis not present

## 2016-08-19 DIAGNOSIS — R739 Hyperglycemia, unspecified: Secondary | ICD-10-CM | POA: Diagnosis not present

## 2016-08-19 DIAGNOSIS — R0609 Other forms of dyspnea: Secondary | ICD-10-CM | POA: Diagnosis not present

## 2016-08-19 DIAGNOSIS — I10 Essential (primary) hypertension: Secondary | ICD-10-CM | POA: Diagnosis not present

## 2016-08-19 DIAGNOSIS — R600 Localized edema: Secondary | ICD-10-CM | POA: Diagnosis not present

## 2016-09-06 ENCOUNTER — Encounter: Payer: Self-pay | Admitting: Neurology

## 2016-09-06 ENCOUNTER — Ambulatory Visit (INDEPENDENT_AMBULATORY_CARE_PROVIDER_SITE_OTHER): Payer: Medicare Other | Admitting: Neurology

## 2016-09-06 DIAGNOSIS — R7309 Other abnormal glucose: Secondary | ICD-10-CM | POA: Insufficient documentation

## 2016-09-06 DIAGNOSIS — G3184 Mild cognitive impairment, so stated: Secondary | ICD-10-CM

## 2016-09-06 NOTE — Progress Notes (Addendum)
PATIENT: Maria Bolton DOB: 1946-02-11  Chief Complaint  Patient presents with  . Memory Loss    MMSE 29/30 - 10 animals.  She is here with her husband, Abbe Amsterdam.  She has noticed a gradual decline in her short-term memory over the last six months.  . Cardiology    Adrian Prows, MD - referring MD  . PCP    Thressa Sheller, MD     HISTORICAL  Maria Bolton is a 71 years old right-handed female, accompanied by her husband, seen in refer by her cardiologist Dr. Adrian Prows for evaluation of memory loss, her primary care physician is Dr. Teena Irani, initial evaluation was on September 06 2016.  I reviewed and are summarized referring note, she had a history of chronic diastolic heart failure, hyperglycemia, but not taking any medications for diabetes, COPD, hypothyroidism, vitamin D deficiency, psoriatic arthritis, left breast cancer, status post lobectomy in 2012 followed by radiation therapy, no chemotherapy is required.  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  Mother died of breast cancer at age 41, her father died at age 39, did suffer severe Alzheimer's disease.   Around 2016, she noticed she has tendency to forget peoples name, misplaced things, got confused while driving in a familiar route, She is still very active at home, does yard work, used to Program[employer], [employer], in 2016, normal CBC creatinine of 0.69, potassium of 3.8, normal CMP, A1c 5.9, total cholesterol 163, LDL 88, normal TSH 0.57, vitamin D 49.9, next  Echocardiogram December 2016, left ventricular cavity is normal, moderate concentric hypertrophy of the left ventricle, normal global Fetterolf motion, ejection fraction 54%, grade 1 diastolic dysfunction,  REVIEW OF SYSTEMS: Full 14 system review of systems performed and notable only for swelling in legs, joint pain, allergy, memory loss   ALLERGIES: No Known Allergies  HOME MEDICATIONS: Current  Outpatient Prescriptions  Medication Sig Dispense Refill  . albuterol (PROVENTIL HFA;VENTOLIN HFA) 108 (90 Base) MCG/ACT inhaler Inhale 2 puffs into the lungs every 6 (six) hours as needed for wheezing or shortness of breath.    . cholecalciferol (VITAMIN D) 1000 units tablet Take 1,000 Units by mouth daily.    . fluticasone (FLONASE) 50 MCG/ACT nasal spray Place 1 spray into both nostrils daily. 16 g 2  . losartan (COZAAR) 100 MG tablet Take 100 mg by mouth daily.      . tamoxifen (NOLVADEX) 20 MG tablet Take 1 tablet (20 mg total) by mouth daily. 90 tablet 3   No current facility-administered medications for this visit.     PAST MEDICAL HISTORY: Past Medical History:  Diagnosis Date  . Breast cancer ([place]) 04/2011   DCIS left breast , ER/PR+  . Difficulty sleeping    since dx of cancer  . Hypertension 05/11/2011  . Memory loss     PAST SURGICAL HISTORY: Past Surgical History:  Procedure Laterality Date  . BREAST LUMPECTOMY  05/16/2011   Procedure: LUMPECTOMY;  Surgeon: Shann Medal, MD;  Location: [place];  Service: General;  Laterality: Left;  Needle localization Left breast lumpectomy  . [place]  . NEVUS EXCISION  05/16/2011   Procedure: NEVUS EXCISION;  Surgeon: Shann Medal, MD;  Location: [place];  Service: General;  Laterality: Left;  remove mole left breast    FAMILY HISTORY: Family History  Problem Relation Age of Onset  . Cancer Mother 59  breast ca  . Alzheimer's disease Father   . Cancer Maternal Aunt     breast  . Cancer Maternal Uncle     breast  . Cancer Cousin     breast    SOCIAL HISTORY:  Social History   Social History  . Marital status: Married    Spouse name: N/A  . Number of children: 1  . Years of education: HS   Occupational History  . Retired    Social History Main Topics  . Smoking status: Former Smoker    Packs/day: 1.00    Years: 45.00    Types: Cigarettes    Quit date:  11/04/2009  . Smokeless tobacco: Never Used  . Alcohol use No  . Drug use: No  . Sexual activity: Yes   Other Topics Concern  . Not on file   Social History Narrative   Lives at home with husband.   Right-handed.   No caffeine use.     PHYSICAL EXAM   Vitals:   09/06/16 0959  BP: (!) 161/77  Pulse: 78  Weight: 225 lb (102.1 kg)  Height: 5\' 6"  (1.676 m)    Not recorded      Body mass index is 36.32 kg/m.  PHYSICAL EXAMNIATION:  Gen: NAD, conversant, well nourised, obese, well groomed                     Cardiovascular: Regular rate rhythm, no peripheral edema, warm, nontender. Eyes: Conjunctivae clear without exudates or hemorrhage Neck: Supple, no carotid bruits. Pulmonary: Clear to auscultation bilaterally   NEUROLOGICAL EXAM:  MENTAL STATUS: MMSE - Mini Mental State Exam 09/06/2016  Orientation to time 4  Orientation to Place 5  Registration 3  Attention/ Calculation 5  Recall 3  Language- name 2 objects 2  Language- repeat 1  Language- follow 3 step command 3  Language- read & follow direction 1  Write a sentence 1  Copy design 1  Total score 29     CRANIAL NERVES: CN II: Visual fields are full to confrontation. Fundoscopic exam is normal with sharp discs and no vascular changes. Pupils are round equal and briskly reactive to light. CN III, IV, VI: extraocular movement are normal. No ptosis. CN V: Facial sensation is intact to pinprick in all 3 divisions bilaterally. Corneal responses are intact.  CN VII: Face is symmetric with normal eye closure and smile. CN VIII: Hearing is normal to rubbing fingers CN IX, X: Palate elevates symmetrically. Phonation is normal. CN XI: Head turning and shoulder shrug are intact CN XII: Tongue is midline with normal movements and no atrophy.  MOTOR: There is no pronator drift of out-stretched arms. Muscle bulk and tone are normal. Muscle strength is normal.  REFLEXES: Reflexes are 2+ and symmetric at the biceps,  triceps, knees, and ankles. Plantar responses are flexor.  SENSORY: Intact to light touch, pinprick, positional sensation and vibratory sensation are intact in fingers and toes.  COORDINATION: Rapid alternating movements and fine finger movements are intact. There is no dysmetria on finger-to-nose and heel-knee-shin.    GAIT/STANCE: Posture is normal. Gait is steady with normal steps, base, arm swing, and turning. Heel and toe walking are normal. Tandem gait is normal.  Romberg is absent.   DIAGNOSTIC DATA (LABS, IMAGING, TESTING) - I reviewed patient records, labs, notes, testing and imaging myself where available.   ASSESSMENT AND PLAN  Mikiala Fugett is a 71 y.o. female    Mild cognitive impairment  Complete evaluation with MRI of the brain   laboratory evaluations, including TSH, B12   return to clinic in 3 months,   Marcial Pacas, M.D. Ph.D.  [facility] [address] [place], [place] [address] Ph: [phone] Fax: [phone]  CC: Adrian Prows, MD

## 2016-09-08 ENCOUNTER — Telehealth: Payer: Self-pay | Admitting: Neurology

## 2016-09-08 LAB — COMPREHENSIVE METABOLIC PANEL
ALK PHOS: 69 IU/L (ref 39–117)
ALT: 20 IU/L (ref 0–32)
AST: 24 IU/L (ref 0–40)
Albumin/Globulin Ratio: 1.2 (ref 1.2–2.2)
Albumin: 3.7 g/dL (ref 3.6–4.8)
BILIRUBIN TOTAL: 0.2 mg/dL (ref 0.0–1.2)
BUN/Creatinine Ratio: 26 (ref 12–28)
BUN: 19 mg/dL (ref 8–27)
CHLORIDE: 105 mmol/L (ref 96–106)
CO2: 24 mmol/L (ref 18–29)
CREATININE: 0.73 mg/dL (ref 0.57–1.00)
Calcium: 9.2 mg/dL (ref 8.7–10.3)
GFR, EST AFRICAN AMERICAN: 98 mL/min/{1.73_m2} (ref >59)
GFR, EST NON AFRICAN AMERICAN: 85 mL/min/{1.73_m2} (ref >59)
GLUCOSE: 94 mg/dL (ref 65–99)
Globulin, Total: 3.2 g/dL (ref 1.5–4.5)
Potassium: 3.8 mmol/L (ref 3.5–5.2)
Sodium: 144 mmol/L (ref 134–144)
Total Protein: 6.9 g/dL (ref 6.0–8.5)

## 2016-09-08 LAB — CBC
Hematocrit: 41.3 % (ref 34.0–46.6)
Hemoglobin: 13.6 g/dL (ref 11.1–15.9)
MCH: 29.1 pg (ref 26.6–33.0)
MCHC: 32.9 g/dL (ref 31.5–35.7)
MCV: 88 fL (ref 79–97)
PLATELETS: 170 10*3/uL (ref 150–379)
RBC: 4.68 x10E6/uL (ref 3.77–5.28)
RDW: 14.5 % (ref 12.3–15.4)
WBC: 7.1 10*3/uL (ref 3.4–10.8)

## 2016-09-08 LAB — THYROID PANEL WITH TSH
FREE THYROXINE INDEX: 1.2 (ref 1.2–4.9)
T3 UPTAKE RATIO: 16 % — AB (ref 24–39)
T4, Total: 7.4 ug/dL (ref 4.5–12.0)
TSH: 0.163 u[IU]/mL — AB (ref 0.450–4.500)

## 2016-09-08 LAB — VITAMIN B12: Vitamin B-12: 475 pg/mL (ref 232–1245)

## 2016-09-08 LAB — C-REACTIVE PROTEIN: CRP: 8 mg/L — AB (ref 0.0–4.9)

## 2016-09-08 LAB — RPR: RPR: NONREACTIVE

## 2016-09-08 LAB — HGB A1C W/O EAG: Hgb A1c MFr Bld: 5.4 % (ref 4.8–5.6)

## 2016-09-08 NOTE — Telephone Encounter (Signed)
Patient called office returning RN's call.  Please call °

## 2016-09-08 NOTE — Telephone Encounter (Signed)
Spoke to patient - she is aware of results and will call her PCP to follow up.  Labs have been faxed and confirmed to her PCP's attention.

## 2016-09-08 NOTE — Telephone Encounter (Signed)
Please call patient, laboratory showed decreased TSH, suggestive of hyperthyroidism, mild elevated C reactive protein, of unknown clinical significance.  She needs to continue follow-up with her primary care physician to have repeat thyroid functional tasks,

## 2016-09-16 NOTE — Telephone Encounter (Signed)
Pt would like a call back to know if 1. Can Dr Krista Blue give medication for her Thyroid.2. She would like to know if Dr Krista Blue has ever done any blood work on her thyroid. Pt is trying to get needed medication

## 2016-09-18 ENCOUNTER — Ambulatory Visit
Admission: RE | Admit: 2016-09-18 | Discharge: 2016-09-18 | Disposition: A | Discharge status: Home / Self Care | Payer: Medicare Other | Source: Ambulatory Visit | Attending: Neurology | Admitting: Neurology

## 2016-09-18 DIAGNOSIS — R7309 Other abnormal glucose: Secondary | ICD-10-CM

## 2016-09-18 DIAGNOSIS — G3184 Mild cognitive impairment, so stated: Secondary | ICD-10-CM | POA: Diagnosis not present

## 2016-09-18 DIAGNOSIS — R413 Other amnesia: Secondary | ICD-10-CM | POA: Diagnosis not present

## 2016-09-19 DIAGNOSIS — R5383 Other fatigue: Secondary | ICD-10-CM | POA: Diagnosis not present

## 2016-09-19 NOTE — Telephone Encounter (Signed)
Spoke to patient this morning.  She had repeat labs this morning at her PCP office.  She is aware that they will need to follow her for her thyroid problem.  She verbalized understanding.

## 2016-09-22 ENCOUNTER — Encounter: Payer: Self-pay | Admitting: Adult Health

## 2016-09-22 ENCOUNTER — Ambulatory Visit (HOSPITAL_BASED_OUTPATIENT_CLINIC_OR_DEPARTMENT_OTHER): Payer: Medicare Other | Admitting: Adult Health

## 2016-09-22 VITALS — BP 151/74 | HR 71 | Temp 97.9°F | Resp 20 | Wt 224.7 lb

## 2016-09-22 DIAGNOSIS — Z79811 Long term (current) use of aromatase inhibitors: Secondary | ICD-10-CM | POA: Diagnosis not present

## 2016-09-22 DIAGNOSIS — D0512 Intraductal carcinoma in situ of left breast: Secondary | ICD-10-CM | POA: Diagnosis not present

## 2016-09-22 NOTE — Progress Notes (Signed)
CLINIC:  Survivorship   REASON FOR VISIT:  Routine follow-up for history of breast cancer.   BRIEF ONCOLOGIC HISTORY:   1.  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  Adjuvant radiation therapy by Dr. Valere Dross  3.  Genetic testing negative for BRCA1 and 2   4.  Tamoxifen 20 mg started 09/27/2011-09/2016   INTERVAL HISTORY:  [name] presents to the [facility] today for routine follow-up for her history of breast cancer.  Overall, she reports feeling quite well. She is not having any issues today, or any questions or concerns.      REVIEW OF SYSTEMS:  Review of Systems  Constitutional: Negative for chills, fever, malaise/fatigue and weight loss.  HENT: Negative for hearing loss and tinnitus.   Eyes: Negative for blurred vision and double vision.  Respiratory: Negative for cough and shortness of breath.   Cardiovascular: Negative for chest pain and palpitations.  Gastrointestinal: Negative for abdominal pain, blood in stool, constipation, diarrhea, heartburn, nausea and vomiting.  Neurological: Negative for dizziness, tingling, tremors and headaches.  Endo/Heme/Allergies: Does not bruise/bleed easily.  Psychiatric/Behavioral: Negative for depression. The patient is not nervous/anxious.   Breast: Denies any new nodularity, masses, tenderness, nipple changes, or nipple discharge.        PAST MEDICAL/SURGICAL HISTORY:  Past Medical History:  Diagnosis Date  . Breast cancer ([place]) 04/2011   DCIS left breast , ER/PR+  . Difficulty sleeping    since dx of cancer  . Hypertension 05/11/2011  . Memory loss    Past Surgical History:  Procedure Laterality Date  . BREAST LUMPECTOMY  05/16/2011   Procedure: LUMPECTOMY;  Surgeon: Shann Medal, MD;  Location: [place];  Service: General;  Laterality: Left;  Needle localization Left breast lumpectomy  . [place]  . NEVUS EXCISION   05/16/2011   Procedure: NEVUS EXCISION;  Surgeon: Shann Medal, MD;  Location: [place];  Service: General;  Laterality: Left;  remove mole left breast     ALLERGIES:  No Known Allergies   CURRENT MEDICATIONS:  Outpatient Encounter Prescriptions as of 09/22/2016  Medication Sig  . albuterol (PROVENTIL HFA;VENTOLIN HFA) 108 (90 Base) MCG/ACT inhaler Inhale 2 puffs into the lungs every 6 (six) hours as needed for wheezing or shortness of breath.  . cholecalciferol (VITAMIN D) 1000 units tablet Take 1,000 Units by mouth daily.  . fluticasone (FLONASE) 50 MCG/ACT nasal spray Place 1 spray into both nostrils daily. (Patient taking differently: Place 1 spray into both nostrils daily as needed. )  . losartan (COZAAR) 100 MG tablet Take 100 mg by mouth daily.    . tamoxifen (NOLVADEX) 20 MG tablet Take 1 tablet (20 mg total) by mouth daily.   No facility-administered encounter medications on file as of 09/22/2016.      ONCOLOGIC FAMILY HISTORY:  Family History  Problem Relation Age of Onset  . Cancer Mother 47    breast ca  . Alzheimer's disease Father   . Cancer Maternal Aunt     breast  . Cancer Maternal Uncle     breast  . Cancer Cousin     breast     SOCIAL HISTORY:  Carrolyn Hilmes is married and lives in [place], [place].  She has one child and they live in [place], [place].  Ms. Sweezy is currently retired.  She denies any current or history of tobacco, alcohol,  or illicit drug use.     PHYSICAL EXAMINATION:  Vital Signs: Vitals:   09/22/16 1118  BP: (!) 151/74  Pulse: 71  Resp: 20  Temp: 97.9 F (36.6 C)   Filed Weights   09/22/16 1118  Weight: 224 lb 11.2 oz (101.9 kg)   General: Well-nourished, well-appearing female in no acute distress. Accompanied by her husband today.   HEENT: Head is normocephalic.  Pupils equal and reactive to light. Conjunctivae clear without exudate.  Sclerae anicteric. Oral mucosa is pink, moist.  Oropharynx is  pink without lesions or erythema.  Lymph: No cervical, supraclavicular, or infraclavicular lymphadenopathy noted on palpation.  Cardiovascular: Regular rate and rhythm.Marland Kitchen Respiratory: Clear to auscultation bilaterally. Chest expansion symmetric; breathing non-labored.  Breast Exam:  -Left breast: No appreciable masses on palpation. No skin redness, thickening, or peau d'orange appearance; no nipple retraction or nipple discharge; mild distortion in symmetry at previous lumpectomy site well healed scar without erythema or nodularity.  -Right breast: No appreciable masses on palpation. No skin redness, thickening, or peau d'orange appearance; no nipple retraction or nipple discharge;  -Axilla: No axillary adenopathy bilaterally.  GI: Abdomen soft and round; non-tender, non-distended. Bowel sounds normoactive. No hepatosplenomegaly.   GU: Deferred.  Neuro: No focal deficits. Steady gait.  Psych: Mood and affect normal and appropriate for situation.  Extremities: No edema. Skin: Warm and dry.  LABORATORY DATA:  None for this visit   DIAGNOSTIC IMAGING:  Most recent mammogram:     ASSESSMENT AND PLAN:  Ms.. Bolton is a pleasant 71 y.o. 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  She presents to the Survivorship Clinic for surveillance and routine follow-up.   1. History of breast cancer:  Ms. Imes is currently clinically and radiographically without evidence of disease or recurrence of breast cancer. She will be due for mammogram in 07/2017.  She will stop taking the Tamoxifen at the end of this motnh.  We discussed in detail the risks/benefits of Tamoxifen along with stopping at this point.  I encouraged her to call me with any questions or concerns before her next visit at the cancer center, and I would be happy to see her  sooner, if needed.    2. Bone health:  Given Ms. Cantrall's age, history of breast cancer she is at risk for bone demineralization. She was encouraged to increase her consumption of foods rich in calcium, as well as increase her weight-bearing activities.  She was given education on specific food and activities to promote bone health.  3. Cancer screening:  Due to Ms. [place] history and her age, she should receive screening for skin cancers, colon cancer, and gynecologic cancers. She was encouraged to follow-up with her PCP for appropriate cancer screenings.   5. Health maintenance and wellness promotion: Ms. Peterkin was encouraged to consume 5-7 servings of fruits and vegetables per day. She was also encouraged to engage in moderate to vigorous exercise for 30 minutes per day most days of the week. She was instructed to limit her alcohol consumption and continue to abstain from tobacco use.    Dispo:  -Return to cancer center in one year for LTS follow up   A total of (30) minutes of face-to-face time was spent with this patient with greater than 50% of that time in counseling and care-coordination.   Gardenia Phlegm, NP Survivorship  [place] [phone]   Note: PRIMARY CARE PROVIDER Thressa Sheller, [place] [phone]

## 2016-09-22 NOTE — Pre-Procedure Instructions (Signed)
Mammo 2018

## 2016-10-25 ENCOUNTER — Other Ambulatory Visit (HOSPITAL_COMMUNITY): Payer: Self-pay | Admitting: Endocrinology

## 2016-10-25 DIAGNOSIS — E0590 Thyrotoxicosis, unspecified without thyrotoxic crisis or storm: Secondary | ICD-10-CM | POA: Diagnosis not present

## 2016-11-08 ENCOUNTER — Encounter (HOSPITAL_COMMUNITY)
Admission: RE | Admit: 2016-11-08 | Discharge: 2016-11-08 | Disposition: A | Discharge status: Home / Self Care | Payer: Medicare Other | Source: Ambulatory Visit | Attending: Endocrinology | Admitting: Endocrinology

## 2016-11-08 DIAGNOSIS — E0590 Thyrotoxicosis, unspecified without thyrotoxic crisis or storm: Secondary | ICD-10-CM | POA: Insufficient documentation

## 2016-11-08 MED ORDER — SODIUM IODIDE I 131 CAPSULE
13.6000 | Freq: Once | INTRAVENOUS | Status: AC | PRN
  Administered 2016-11-08: 13.6 via ORAL

## 2016-11-09 ENCOUNTER — Encounter (HOSPITAL_COMMUNITY)
Admission: RE | Admit: 2016-11-09 | Discharge: 2016-11-09 | Disposition: A | Discharge status: Home / Self Care | Payer: Medicare Other | Source: Ambulatory Visit | Attending: Endocrinology | Admitting: Endocrinology

## 2016-11-09 DIAGNOSIS — E0590 Thyrotoxicosis, unspecified without thyrotoxic crisis or storm: Secondary | ICD-10-CM | POA: Diagnosis not present

## 2016-11-09 MED ORDER — SODIUM PERTECHNETATE TC 99M INJECTION
11.0000 | Freq: Once | INTRAVENOUS | Status: AC | PRN
  Administered 2016-11-09: 11 via INTRAVENOUS

## 2016-11-16 ENCOUNTER — Other Ambulatory Visit: Payer: Self-pay | Admitting: Endocrinology

## 2016-11-16 DIAGNOSIS — E041 Nontoxic single thyroid nodule: Secondary | ICD-10-CM

## 2016-11-21 ENCOUNTER — Ambulatory Visit
Admission: RE | Admit: 2016-11-21 | Discharge: 2016-11-21 | Disposition: A | Discharge status: Home / Self Care | Payer: Medicare Other | Source: Ambulatory Visit | Attending: Endocrinology | Admitting: Endocrinology

## 2016-11-21 DIAGNOSIS — E041 Nontoxic single thyroid nodule: Secondary | ICD-10-CM | POA: Diagnosis not present

## 2016-11-25 DIAGNOSIS — E0590 Thyrotoxicosis, unspecified without thyrotoxic crisis or storm: Secondary | ICD-10-CM | POA: Diagnosis not present

## 2016-11-28 ENCOUNTER — Other Ambulatory Visit (HOSPITAL_COMMUNITY): Payer: Self-pay | Admitting: Endocrinology

## 2016-11-28 ENCOUNTER — Other Ambulatory Visit: Payer: Self-pay | Admitting: Endocrinology

## 2016-11-28 DIAGNOSIS — E0590 Thyrotoxicosis, unspecified without thyrotoxic crisis or storm: Secondary | ICD-10-CM

## 2016-12-01 ENCOUNTER — Encounter (HOSPITAL_COMMUNITY)
Admission: RE | Admit: 2016-12-01 | Discharge: 2016-12-01 | Disposition: A | Discharge status: Home / Self Care | Payer: Medicare Other | Source: Ambulatory Visit | Attending: Endocrinology | Admitting: Endocrinology

## 2016-12-01 ENCOUNTER — Encounter (HOSPITAL_COMMUNITY): Payer: Medicare Other

## 2016-12-01 DIAGNOSIS — E0590 Thyrotoxicosis, unspecified without thyrotoxic crisis or storm: Secondary | ICD-10-CM | POA: Diagnosis not present

## 2016-12-01 MED ORDER — SODIUM IODIDE I 131 CAPSULE
29.8000 | Freq: Once | INTRAVENOUS | Status: AC | PRN
  Administered 2016-12-01: 29.8 via ORAL

## 2016-12-27 ENCOUNTER — Encounter (INDEPENDENT_AMBULATORY_CARE_PROVIDER_SITE_OTHER): Payer: Self-pay

## 2016-12-27 ENCOUNTER — Ambulatory Visit (INDEPENDENT_AMBULATORY_CARE_PROVIDER_SITE_OTHER): Payer: Medicare Other | Admitting: Neurology

## 2016-12-27 ENCOUNTER — Encounter: Payer: Self-pay | Admitting: Neurology

## 2016-12-27 VITALS — BP 175/81 | HR 71 | Ht 66.0 in | Wt 235.0 lb

## 2016-12-27 DIAGNOSIS — G3184 Mild cognitive impairment, so stated: Secondary | ICD-10-CM

## 2016-12-27 DIAGNOSIS — R7989 Other specified abnormal findings of blood chemistry: Secondary | ICD-10-CM

## 2016-12-27 DIAGNOSIS — R946 Abnormal results of thyroid function studies: Secondary | ICD-10-CM

## 2016-12-27 MED ORDER — DONEPEZIL HCL 10 MG PO TABS: 10.0000 mg | ORAL_TABLET | Freq: Every day | ORAL | 11 refills | Status: DC

## 2016-12-27 MED ORDER — MEMANTINE HCL 10 MG PO TABS: 10.0000 mg | ORAL_TABLET | Freq: Two times a day (BID) | ORAL | 11 refills | Status: DC

## 2016-12-27 NOTE — Progress Notes (Addendum)
PATIENT: Maria Bolton DOB: 01-22-1946  Chief Complaint  Patient presents with  . Mild Cognitive Impairment    MMSE 27/30 - 13 animals.  She is here with her husband, Abbe Amsterdam.  Feels short-term memory loss is unchanged.  They would like to further review her brain MRI results.     HISTORICAL  Maria Bolton is a 71 years old right-handed female, accompanied by her husband, seen in refer by her cardiologist Dr. Adrian Prows for evaluation of memory loss, her primary care physician is Dr. Teena Irani, initial evaluation was on September 06 2016.  I reviewed and are summarized referring note, she had a history of chronic diastolic heart failure, hyperglycemia, but not taking any medications for diabetes, COPD, hypothyroidism, vitamin D deficiency, psoriatic arthritis, left breast cancer, status post lobectomy in 2012 followed by radiation therapy, no chemotherapy is required.  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  Mother died of breast cancer at age 46, her father died at age 58, did suffer severe Alzheimer's disease.   Around 2016, she noticed she has tendency to forget peoples name, misplaced things, got confused while driving in a familiar route, She is still very active at home, does yard work, used to Programmer, multimedia, in 2016, normal CBC creatinine of 0.69, potassium of 3.8, normal CMP, A1c 5.9, total cholesterol 163, LDL 88, normal TSH 0.57, vitamin D 49.9, next  Echocardiogram December 2016, left ventricular cavity is normal, moderate concentric hypertrophy of the left ventricle, normal global Wygant motion, ejection fraction 62%, grade 1 diastolic dysfunction.  UPDATE December 27 2016: I reviewed the laboratory evaluation in 2018: A1c 5.4, decreased TSH 0.163, mild elevated C reactive protein, normal and negative RPR, vitamin B12 475, CMP, CBC,  She is going to her thyroid reevaluation, but she could not  remember specialist name and location Ultrasound of thyroid on November 21 2016, multiple bilateral nodules,  She had radioactive iodine treatment for toxic multiple nodular goiter on June 28th 2018.  She continue struggle with short-term memory loss, Mini-Mental Status is 27/30, missed 3 out of 3 recall,  We have personally reviewed MRI of the brain in April 2018: Generalized atrophy, mild supratentorium small vessel disease  REVIEW OF SYSTEMS: Full 14 system review of systems performed and notable only for swelling in legs, joint pain, allergy, memory loss   ALLERGIES: No Known Allergies  HOME MEDICATIONS: Current Outpatient Prescriptions  Medication Sig Dispense Refill  . albuterol (PROVENTIL HFA;VENTOLIN HFA) 108 (90 Base) MCG/ACT inhaler Inhale 2 puffs into the lungs every 6 (six) hours as needed for wheezing or shortness of breath.    . cholecalciferol (VITAMIN D) 1000 units tablet Take 1,000 Units by mouth daily.    . fluticasone (FLONASE) 50 MCG/ACT nasal spray Place 1 spray into both nostrils daily. (Patient taking differently: Place 1 spray into both nostrils daily as needed. ) 16 g 2  . losartan (COZAAR) 100 MG tablet Take 100 mg by mouth daily.      . tamoxifen (NOLVADEX) 20 MG tablet Take 1 tablet (20 mg total) by mouth daily. 90 tablet 3   No current facility-administered medications for this visit.     PAST MEDICAL HISTORY: Past Medical History:  Diagnosis Date  . 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 Difficulty sleeping    since dx of cancer  . Hypertension 05/11/2011  . Memory loss  PAST SURGICAL HISTORY: Past Surgical History:  Procedure Laterality Date  . BREAST LUMPECTOMY  05/16/2011   Procedure: LUMPECTOMY;  Surgeon: Shann Medal, MD;  Location: [facility];  Service: General;  Laterality: Left;  Needle localization Left breast lumpectomy  . [place]  . NEVUS EXCISION  05/16/2011   Procedure: NEVUS EXCISION;   Surgeon: Shann Medal, MD;  Location: [facility];  Service: General;  Laterality: Left;  remove mole left breast    FAMILY HISTORY: Family History  Problem Relation Age of Onset  . Cancer Mother 34       breast ca  . Alzheimer's disease Father   . Cancer Maternal Aunt        breast  . Cancer Maternal Uncle        breast  . Cancer Cousin        breast    SOCIAL HISTORY:  Social History   Social History  . Marital status: Married    Spouse name: N/A  . Number of children: 1  . Years of education: HS   Occupational History  . Retired    Social History Main Topics  . Smoking status: Former Smoker    Packs/day: 1.00    Years: 45.00    Types: Cigarettes    Quit date: 11/04/2009  . Smokeless tobacco: Never Used  . Alcohol use No  . Drug use: No  . Sexual activity: Yes   Other Topics Concern  . Not on file   Social History Narrative   Lives at home with husband.   Right-handed.   No caffeine use.     PHYSICAL EXAM   Vitals:   12/27/16 0832  BP: (!) 175/81  Pulse: 71  Weight: 235 lb (106.6 kg)  Height: 5\' 6"  (1.676 m)    Not recorded      Body mass index is 37.93 kg/m.  PHYSICAL EXAMNIATION:  Gen: NAD, conversant, well nourised, obese, well groomed                     Cardiovascular: Regular rate rhythm, no peripheral edema, warm, nontender. Eyes: Conjunctivae clear without exudates or hemorrhage Neck: Supple, no carotid bruits. Pulmonary: Clear to auscultation bilaterally   NEUROLOGICAL EXAM:  MENTAL STATUS: MMSE - Mini Mental State Exam 12/27/2016 09/06/2016  Orientation to time 5 4  Orientation to Place 5 5  Registration 3 3  Attention/ Calculation 5 5  Recall 0 3  Language- name 2 objects 2 2  Language- repeat 1 1  Language- follow 3 step command 3 3  Language- read & follow direction 1 1  Write a sentence 1 1  Copy design 1 1  Total score 27 29  Animal Naming 13  CRANIAL NERVES: CN II: Visual fields are full to  confrontation. Fundoscopic exam is normal with sharp discs and no vascular changes. Pupils are round equal and briskly reactive to light. CN III, IV, VI: extraocular movement are normal. No ptosis. CN V: Facial sensation is intact to pinprick in all 3 divisions bilaterally. Corneal responses are intact.  CN VII: Face is symmetric with normal eye closure and smile. CN VIII: Hearing is normal to rubbing fingers CN IX, X: Palate elevates symmetrically. Phonation is normal. CN XI: Head turning and shoulder shrug are intact CN XII: Tongue is midline with normal movements and no atrophy.  MOTOR: There is no pronator drift of out-stretched arms. Muscle bulk and tone are normal.  Muscle strength is normal.  REFLEXES: Reflexes are 2+ and symmetric at the biceps, triceps, knees, and ankles. Plantar responses are flexor.  SENSORY: Intact to light touch, pinprick, positional sensation and vibratory sensation are intact in fingers and toes.  COORDINATION: Rapid alternating movements and fine finger movements are intact. There is no dysmetria on finger-to-nose and heel-knee-shin.    GAIT/STANCE: Posture is normal. Gait is steady with normal steps, base, arm swing, and turning. Heel and toe walking are normal. Tandem gait is normal.  Romberg is absent.   DIAGNOSTIC DATA (LABS, IMAGING, TESTING) - I reviewed patient records, labs, notes, testing and imaging myself where available.   ASSESSMENT AND PLAN  Maria Bolton is a 71 y.o. female    Mild cognitive impairment   Profound short-term memory loss, missed 3 out of 3 recalls  Start Namenda 10 mg twice a day, Aricept 10 mg daily  Toxic Multinodular goiter  Repeat thyroid function test   Marcial Pacas, M.D. Ph.D.  [facility] [address] [place], [place] [address] Ph: [phone] Fax: [phone]  CC: Adrian Prows, MD  Addendum: I have reviewed and summarized note from her endocrinologist Dr. Chalmers Cater, Mindi Curling  dated November 25 2016, hyperthyroidism, anxiety, thyrotoxicosis without thyroid toxic crisis or storm, she was treated for radioactive iodine on December 01 2016, treatment of toxic multinodular goiter

## 2016-12-28 ENCOUNTER — Telehealth: Payer: Self-pay | Admitting: Neurology

## 2016-12-28 LAB — THYROID PANEL WITH TSH
Free Thyroxine Index: 1.1 — ABNORMAL LOW (ref 1.2–4.9)
T3 Uptake Ratio: 15 % — ABNORMAL LOW (ref 24–39)
T4, Total: 7.3 ug/dL (ref 4.5–12.0)
TSH: 1.2 u[IU]/mL (ref 0.450–4.500)

## 2016-12-28 NOTE — Telephone Encounter (Signed)
Spoke to patient - she would like labs sent to her PCP, Dr. Thressa Sheller.  She will call his office and schedule a follow up to discuss her abnormal labs.  Results faxed and confirmed her PCP office.

## 2016-12-28 NOTE — Telephone Encounter (Signed)
Please call patient, TSH was within normal limits, but still with decreased T3 uptake ratio, decreased free thyroxine index  She should continue follow-up with her endocrinologist, please advise her to send her endocrinologist information and the record over

## 2016-12-28 NOTE — Telephone Encounter (Addendum)
Left message letting her know the abnormal labs discussed below have been faxed and confirmed to her PCP.  Her PCP will determine which additional labs she will need to complete.  Provided our number to call back with any further questions.

## 2016-12-28 NOTE — Telephone Encounter (Signed)
Patient calling back to discuss labs she should have.

## 2016-12-29 NOTE — Telephone Encounter (Addendum)
Left another message letting them know the abnormal labs discussed below have been faxed and confirmed to her PCP.  Her PCP will determine which additional labs she will need to complete.  Provided our number to call back with any further questions.

## 2016-12-29 NOTE — Telephone Encounter (Signed)
Pt husband is asking for a call back re: labs please

## 2016-12-29 NOTE — Telephone Encounter (Signed)
Left message for a return call

## 2016-12-29 NOTE — Telephone Encounter (Signed)
Left another message for a return call

## 2016-12-30 NOTE — Telephone Encounter (Signed)
Pt's husband called back. He did not need information on labs, he wanted Dr Krista Blue to have the information regarding the radiation of the thyroid. I told him Dr Krista Blue had rec'd that information and added an addendum to the [place]. He was very Patent attorney. No need to call him back.

## 2017-01-31 DIAGNOSIS — E0590 Thyrotoxicosis, unspecified without thyrotoxic crisis or storm: Secondary | ICD-10-CM | POA: Diagnosis not present

## 2017-02-07 DIAGNOSIS — I129 Hypertensive chronic kidney disease with stage 1 through stage 4 chronic kidney disease, or unspecified chronic kidney disease: Secondary | ICD-10-CM | POA: Diagnosis not present

## 2017-02-07 DIAGNOSIS — E0590 Thyrotoxicosis, unspecified without thyrotoxic crisis or storm: Secondary | ICD-10-CM | POA: Diagnosis not present

## 2017-03-24 DIAGNOSIS — N390 Urinary tract infection, site not specified: Secondary | ICD-10-CM | POA: Diagnosis not present

## 2017-03-24 DIAGNOSIS — Z0000 Encounter for general adult medical examination without abnormal findings: Secondary | ICD-10-CM | POA: Diagnosis not present

## 2017-03-24 DIAGNOSIS — I129 Hypertensive chronic kidney disease with stage 1 through stage 4 chronic kidney disease, or unspecified chronic kidney disease: Secondary | ICD-10-CM | POA: Diagnosis not present

## 2017-03-24 DIAGNOSIS — Z23 Encounter for immunization: Secondary | ICD-10-CM | POA: Diagnosis not present

## 2017-03-24 DIAGNOSIS — E0590 Thyrotoxicosis, unspecified without thyrotoxic crisis or storm: Secondary | ICD-10-CM | POA: Diagnosis not present

## 2017-03-24 DIAGNOSIS — E559 Vitamin D deficiency, unspecified: Secondary | ICD-10-CM | POA: Diagnosis not present

## 2017-04-05 DIAGNOSIS — J449 Chronic obstructive pulmonary disease, unspecified: Secondary | ICD-10-CM | POA: Diagnosis not present

## 2017-04-05 DIAGNOSIS — Z780 Asymptomatic menopausal state: Secondary | ICD-10-CM | POA: Diagnosis not present

## 2017-04-05 DIAGNOSIS — Z0001 Encounter for general adult medical examination with abnormal findings: Secondary | ICD-10-CM | POA: Diagnosis not present

## 2017-04-05 DIAGNOSIS — L4050 Arthropathic psoriasis, unspecified: Secondary | ICD-10-CM | POA: Diagnosis not present

## 2017-04-05 DIAGNOSIS — N182 Chronic kidney disease, stage 2 (mild): Secondary | ICD-10-CM | POA: Diagnosis not present

## 2017-04-13 ENCOUNTER — Other Ambulatory Visit: Payer: Self-pay | Admitting: Acute Care

## 2017-04-13 ENCOUNTER — Other Ambulatory Visit: Payer: Self-pay | Admitting: Hematology and Oncology

## 2017-04-13 DIAGNOSIS — Z122 Encounter for screening for malignant neoplasm of respiratory organs: Secondary | ICD-10-CM

## 2017-04-13 DIAGNOSIS — D0512 Intraductal carcinoma in situ of left breast: Secondary | ICD-10-CM

## 2017-04-13 DIAGNOSIS — Z87891 Personal history of nicotine dependence: Secondary | ICD-10-CM

## 2017-04-13 NOTE — Progress Notes (Signed)
hest  

## 2017-04-14 DIAGNOSIS — D485 Neoplasm of uncertain behavior of skin: Secondary | ICD-10-CM | POA: Diagnosis not present

## 2017-04-14 DIAGNOSIS — L929 Granulomatous disorder of the skin and subcutaneous tissue, unspecified: Secondary | ICD-10-CM | POA: Diagnosis not present

## 2017-05-02 ENCOUNTER — Encounter: Payer: Self-pay | Admitting: Neurology

## 2017-05-02 ENCOUNTER — Telehealth: Payer: Self-pay | Admitting: Neurology

## 2017-05-02 ENCOUNTER — Ambulatory Visit: Payer: Medicare Other | Admitting: Neurology

## 2017-05-02 VITALS — BP 153/70 | HR 73 | Ht 66.0 in | Wt 242.0 lb

## 2017-05-02 DIAGNOSIS — G3184 Mild cognitive impairment, so stated: Secondary | ICD-10-CM

## 2017-05-02 NOTE — Progress Notes (Addendum)
PATIENT: Maria Bolton DOB: 26-Jan-1946  Chief Complaint  Patient presents with  . Memory Loss    MMSE 29/30 - 9 animals.  She is here with her husband, Abbe Amsterdam.  Feels her memory is stable with Namenda and Aricept.     HISTORICAL  Romeka Scifres is a 71 years old right-handed female, accompanied by her husband, seen in refer by her cardiologist Dr. Adrian Prows for evaluation of memory loss, her primary care physician is Dr. Teena Irani, initial evaluation was on September 06 2016.  I reviewed and are summarized referring note, she had a history of chronic diastolic heart failure, hyperglycemia, but not taking any medications for diabetes, COPD, hypothyroidism, vitamin D deficiency, psoriatic arthritis, left breast cancer, status post lobectomy in 2012 followed by radiation therapy, no chemotherapy is required.  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  Mother died of breast cancer at age 34, her father died at age 74, did suffer severe Alzheimer's disease.   Around 2016, she noticed she has tendency to forget peoples name, misplaced things, got confused while driving in a familiar route, She is still very active at home, does yard work, used to Programmer, multimedia, in 2016, normal CBC creatinine of 0.69, potassium of 3.8, normal CMP, A1c 5.9, total cholesterol 163, LDL 88, normal TSH 0.57, vitamin D 49.9, next  Echocardiogram December 2016, left ventricular cavity is normal, moderate concentric hypertrophy of the left ventricle, normal global Heenan motion, ejection fraction 36%, grade 1 diastolic dysfunction.  UPDATE December 27 2016: I reviewed the laboratory evaluation in 2018: A1c 5.4, decreased TSH 0.163, mild elevated C reactive protein, normal and negative RPR, vitamin B12 475, CMP, CBC,  She is going to her thyroid reevaluation, but she could not remember specialist name and location Ultrasound of thyroid on  November 21 2016, multiple bilateral nodules,  She had radioactive iodine treatment for toxic multiple nodular goiter on June 28th 2018.  She continue struggle with short-term memory loss, Mini-Mental Status is 27/30, missed 3 out of 3 recall,  We have personally reviewed MRI of the brain in April 2018: Generalized atrophy, mild supratentorium small vessel disease  UPDATE May 02 2017:  I have reviewed and summarized note from her endocrinologist Dr. Chalmers Cater, Mindi Curling dated November 25 2016, hyperthyroidism, anxiety, thyrotoxicosis without thyroid toxic crisis or storm, she was treated for radioactive iodine on December 01 2016, treatment of toxic multinodular goiter  She is tolerating Aricept 10 mg every day, Namenda 10 mg twice a day, her husband reported mild worsening memory loss,  I reviewed the laboratory evaluations in October 2018, cholesterol 156, LDL 68, normal TSH, vitamin D was 49, hemoglobin was 13.3, creatinine was 0.72,  REVIEW OF SYSTEMS: Full 14 system review of systems performed and notable only for memory loss, insomnia  ALLERGIES: No Known Allergies  HOME MEDICATIONS: Current Outpatient Medications  Medication Sig Dispense Refill  . cholecalciferol (VITAMIN D) 1000 units tablet Take 1,000 Units by mouth daily.    Marland Kitchen donepezil (ARICEPT) 10 MG tablet Take 1 tablet (10 mg total) by mouth at bedtime. 30 tablet 11  . losartan (COZAAR) 100 MG tablet Take 100 mg by mouth daily.      . memantine (NAMENDA) 10 MG tablet Take 1 tablet (10 mg total) by mouth 2 (two) times daily. 60 tablet 11  . tamoxifen (NOLVADEX) 20 MG tablet TAKE 1 TABLET(20 MG) BY MOUTH DAILY 90 tablet  0   No current facility-administered medications for this visit.     PAST MEDICAL HISTORY: Past Medical History:  Diagnosis Date  . 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 Difficulty sleeping    since dx of cancer  . Hypertension 05/11/2011  . Memory loss     PAST SURGICAL HISTORY: Past  Surgical History:  Procedure Laterality Date  . BREAST LUMPECTOMY  05/16/2011   Procedure: LUMPECTOMY;  Surgeon: Shann Medal, MD;  Location: [facility];  Service: General;  Laterality: Left;  Needle localization Left breast lumpectomy  . [place]  . NEVUS EXCISION  05/16/2011   Procedure: NEVUS EXCISION;  Surgeon: Shann Medal, MD;  Location: [facility];  Service: General;  Laterality: Left;  remove mole left breast    FAMILY HISTORY: Family History  Problem Relation Age of Onset  . Cancer Mother 56       breast ca  . Alzheimer's disease Father   . Cancer Maternal Aunt        breast  . Cancer Maternal Uncle        breast  . Cancer Cousin        breast    SOCIAL HISTORY:  Social History   Socioeconomic History  . Marital status: Married    Spouse name: Not on file  . Number of children: 1  . Years of education: HS  . Highest education level: Not on file  Social Needs  . Financial resource strain: Not on file  . Food insecurity - worry: Not on file  . Food insecurity - inability: Not on file  . Transportation needs - medical: Not on file  . Transportation needs - non-medical: Not on file  Occupational History  . Occupation: Retired  Tobacco Use  . Smoking status: Former Smoker    Packs/day: 1.00    Years: 45.00    Pack years: 45.00    Types: Cigarettes    Last attempt to quit: 11/04/2009    Years since quitting: 7.4  . Smokeless tobacco: Never Used  Substance and Sexual Activity  . Alcohol use: No  . Drug use: No  . Sexual activity: Yes  Other Topics Concern  . Not on file  Social History Narrative   Lives at home with husband.   Right-handed.   No caffeine use.     PHYSICAL EXAM   Vitals:   05/02/17 0932  BP: (!) 153/70  Pulse: 73  Weight: 242 lb (109.8 kg)  Height: 5\' 6"  (1.676 m)    Not recorded      Body mass index is 39.06 kg/m.  PHYSICAL EXAMNIATION:  Gen: NAD, conversant, well nourised,  obese, well groomed                     Cardiovascular: Regular rate rhythm, no peripheral edema, warm, nontender. Eyes: Conjunctivae clear without exudates or hemorrhage Neck: Supple, no carotid bruits. Pulmonary: Clear to auscultation bilaterally   NEUROLOGICAL EXAM:  MENTAL STATUS: MMSE - Mini Mental State Exam 05/02/2017 12/27/2016 09/06/2016  Orientation to time 5 5 4   Orientation to Place 5 5 5   Registration 3 3 3   Attention/ Calculation 5 5 5   Recall 2 0 3  Language- name 2 objects 2 2 2   Language- repeat 1 1 1   Language- follow 3 step command 3 3 3   Language- read & follow direction 1 1 1   Write a  sentence 1 1 1   Copy design 1 1 1   Total score 29 27 29   Animal Naming 13  CRANIAL NERVES: CN II: Visual fields are full to confrontation. Fundoscopic exam is normal with sharp discs and no vascular changes. Pupils are round equal and briskly reactive to light. CN III, IV, VI: extraocular movement are normal. No ptosis. CN V: Facial sensation is intact to pinprick in all 3 divisions bilaterally. Corneal responses are intact.  CN VII: Face is symmetric with normal eye closure and smile. CN VIII: Hearing is normal to rubbing fingers CN IX, X: Palate elevates symmetrically. Phonation is normal. CN XI: Head turning and shoulder shrug are intact CN XII: Tongue is midline with normal movements and no atrophy.  MOTOR: There is no pronator drift of out-stretched arms. Muscle bulk and tone are normal. Muscle strength is normal.  REFLEXES: Reflexes are 2+ and symmetric at the biceps, triceps, knees, and ankles. Plantar responses are flexor.  SENSORY: Intact to light touch, pinprick, positional sensation and vibratory sensation are intact in fingers and toes.  COORDINATION: Rapid alternating movements and fine finger movements are intact. There is no dysmetria on finger-to-nose and heel-knee-shin.    GAIT/STANCE: Posture is normal. Gait is steady with normal steps, base, arm swing,  and turning. Heel and toe walking are normal. Tandem gait is normal.  Romberg is absent.   DIAGNOSTIC DATA (LABS, IMAGING, TESTING) - I reviewed patient records, labs, notes, testing and imaging myself where available.   ASSESSMENT AND PLAN  Maisen Schmit is a 71 y.o. female    Mild cognitive impairment   Most consistent with central nervous system degenerative disorder  Continue Aricept, Namenda  Encourage her moderate exercise   Marcial Pacas, M.D. Ph.D.  [facility] [address] [place], [place] [address] Ph: [phone] Fax: [phone]  CC: Adrian Prows, MD

## 2017-05-03 NOTE — Telephone Encounter (Signed)
Error

## 2017-05-10 ENCOUNTER — Encounter: Payer: Self-pay | Admitting: Acute Care

## 2017-05-10 ENCOUNTER — Ambulatory Visit (INDEPENDENT_AMBULATORY_CARE_PROVIDER_SITE_OTHER): Payer: Medicare Other | Admitting: Acute Care

## 2017-05-10 ENCOUNTER — Ambulatory Visit (INDEPENDENT_AMBULATORY_CARE_PROVIDER_SITE_OTHER)
Admission: RE | Admit: 2017-05-10 | Discharge: 2017-05-10 | Disposition: A | Discharge status: Home / Self Care | Payer: Medicare Other | Source: Ambulatory Visit | Attending: Acute Care | Admitting: Acute Care

## 2017-05-10 DIAGNOSIS — Z87891 Personal history of nicotine dependence: Secondary | ICD-10-CM | POA: Diagnosis not present

## 2017-05-10 DIAGNOSIS — Z122 Encounter for screening for malignant neoplasm of respiratory organs: Secondary | ICD-10-CM

## 2017-05-10 NOTE — Progress Notes (Signed)
Shared Decision Making Visit Lung Cancer Screening Program [phone])   Eligibility:  Age 71 y.o.  Pack Years Smoking History Calculation 46 pack years (# packs/per year x # years smoked)  Recent History of coughing up blood  no  Unexplained weight loss? no ( >Than 15 pounds within the last 6 months )  Prior History Lung / other cancer no (Diagnosis within the last 5 years already requiring surveillance chest CT Scans).  Smoking Status Former Smoker  Former Smokers: Years since quit: 7 years  Quit Date: 2011  Visit Components:  Discussion included one or more decision making aids. yes  Discussion included risk/benefits of screening. yes  Discussion included potential follow up diagnostic testing for abnormal scans. yes  Discussion included meaning and risk of over diagnosis. yes  Discussion included meaning and risk of False Positives. yes  Discussion included meaning of total radiation exposure. yes  Counseling Included:  Importance of adherence to annual lung cancer LDCT screening. yes  Impact of comorbidities on ability to participate in the program. yes  Ability and willingness to under diagnostic treatment. yes  Smoking Cessation Counseling:  Current Smokers:   Discussed importance of smoking cessation. NA  Information about tobacco cessation classes and interventions provided to patient. NA  Patient provided with "ticket" for LDCT Scan. yes  Symptomatic Patient. no  Counseling  Diagnosis Code: Tobacco Use Z72.0  Asymptomatic Patient yes  Counseling (Intermediate counseling: > three minutes counseling) [id number]  Former Smokers:   Discussed the importance of maintaining cigarette abstinence. yes  Diagnosis Code: Personal History of Nicotine Dependence. O97.353  Information about tobacco cessation classes and interventions provided to patient. Yes  Patient provided with "ticket" for LDCT Scan. yes  Written Order for Lung Cancer Screening with  LDCT placed in Epic. Yes (CT Chest Lung Cancer Screening Low Dose W/O CM) [id number] Z12.2-Screening of respiratory organs Z87.891-Personal history of nicotine dependence  I spent 25 minutes of face to face time with Maria Bolton and Maria Bolton  discussing the risks and benefits of lung cancer screening. We viewed a power point together that explained in detail the above noted topics. We took the time to pause the power point at intervals to allow for questions to be asked and answered to ensure understanding. We discussed that she had taken the single most powerful action possible to decrease Maria risk of developing lung cancer when she quit smoking. I counseled Maria to remain smoke free, and to contact me if she ever had the desire to smoke again so that I can provide resources and tools to help support the effort to remain smoke free. We discussed the time and location of the scan, and that either  Doroteo Glassman RN or I will call with the results within  24-48 hours of receiving them. Maria Bolton  has my card and contact information in the event she needs to speak with me, in addition to a copy of the power point we reviewed as a resource. Maria Bolton  verbalized understanding of all of the above and had no further questions upon leaving the office.     I explained to the patient that there has been a high incidence of coronary artery disease noted on these exams. I explained that this is a non-gated exam therefore degree or severity cannot be determined. This patient is not on statin therapy. I have asked the patient to follow-up with their PCP regarding any incidental finding of coronary artery disease and management with diet or  medication as they feel is clinically indicated. The patient verbalized understanding of the above and had no further questions.     Magdalen Spatz, NP 05/10/2017

## 2017-05-11 ENCOUNTER — Other Ambulatory Visit: Payer: Self-pay | Admitting: Acute Care

## 2017-05-11 DIAGNOSIS — Z122 Encounter for screening for malignant neoplasm of respiratory organs: Secondary | ICD-10-CM

## 2017-05-11 DIAGNOSIS — Z87891 Personal history of nicotine dependence: Secondary | ICD-10-CM

## 2017-07-15 ENCOUNTER — Other Ambulatory Visit: Payer: Self-pay | Admitting: Hematology and Oncology

## 2017-07-15 DIAGNOSIS — D0512 Intraductal carcinoma in situ of left breast: Secondary | ICD-10-CM

## 2017-08-01 DIAGNOSIS — Z853 Personal history of malignant neoplasm of breast: Secondary | ICD-10-CM | POA: Diagnosis not present

## 2017-08-01 DIAGNOSIS — R928 Other abnormal and inconclusive findings on diagnostic imaging of breast: Secondary | ICD-10-CM | POA: Diagnosis not present

## 2017-08-08 DIAGNOSIS — E0590 Thyrotoxicosis, unspecified without thyrotoxic crisis or storm: Secondary | ICD-10-CM | POA: Diagnosis not present

## 2017-09-22 ENCOUNTER — Telehealth: Payer: Self-pay | Admitting: Adult Health

## 2017-09-22 ENCOUNTER — Encounter: Payer: Self-pay | Admitting: Adult Health

## 2017-09-22 NOTE — Telephone Encounter (Signed)
patient received message about coming in at 245 just wanted to verify her appointment time

## 2017-09-26 ENCOUNTER — Telehealth: Payer: Self-pay | Admitting: Adult Health

## 2017-09-26 ENCOUNTER — Encounter: Payer: Self-pay | Admitting: Adult Health

## 2017-09-26 ENCOUNTER — Inpatient Hospital Stay: Payer: Medicare Other | Attending: Adult Health | Admitting: Adult Health

## 2017-09-26 VITALS — BP 148/82 | HR 80 | Temp 98.8°F | Resp 17 | Ht 66.0 in | Wt 246.9 lb

## 2017-09-26 DIAGNOSIS — D0512 Intraductal carcinoma in situ of left breast: Secondary | ICD-10-CM | POA: Insufficient documentation

## 2017-09-26 DIAGNOSIS — N644 Mastodynia: Secondary | ICD-10-CM | POA: Diagnosis not present

## 2017-09-26 DIAGNOSIS — B369 Superficial mycosis, unspecified: Secondary | ICD-10-CM

## 2017-09-26 MED ORDER — KETOCONAZOLE 2 % EX CREA: 1.0000 "application " | TOPICAL_CREAM | Freq: Every day | CUTANEOUS | 0 refills | Status: DC

## 2017-09-26 NOTE — Patient Instructions (Signed)
Bone Health Bones protect organs, store calcium, and anchor muscles. Good health habits, such as eating nutritious foods and exercising regularly, are important for maintaining healthy bones. They can also help to prevent a condition that causes bones to lose density and become weak and brittle (osteoporosis). Why is bone mass important? Bone mass refers to the amount of bone tissue that you have. The higher your bone mass, the stronger your bones. An important step toward having healthy bones throughout life is to have strong and dense bones during childhood. A young adult who has a high bone mass is more likely to have a high bone mass later in life. Bone mass at its greatest it is called peak bone mass. A large decline in bone mass occurs in older adults. In women, it occurs about the time of menopause. During this time, it is important to practice good health habits, because if more bone is lost than what is replaced, the bones will become less healthy and more likely to break (fracture). If you find that you have a low bone mass, you may be able to prevent osteoporosis or further bone loss by changing your diet and lifestyle. How can I find out if my bone mass is low? Bone mass can be measured with an X-ray test that is called a bone mineral density (BMD) test. This test is recommended for all women who are age 65 or older. It may also be recommended for men who are age 70 or older, or for people who are more likely to develop osteoporosis due to:  Having bones that break easily.  Having a long-term disease that weakens bones, such as kidney disease or rheumatoid arthritis.  Having menopause earlier than normal.  Taking medicine that weakens bones, such as steroids, thyroid hormones, or hormone treatment for breast cancer or prostate cancer.  Smoking.  Drinking three or more alcoholic drinks each day.  What are the nutritional recommendations for healthy bones? To have healthy bones, you  need to get enough of the right minerals and vitamins. Most nutrition experts recommend getting these nutrients from the foods that you eat. Nutritional recommendations vary from person to person. Ask your health care provider what is healthy for you. Here are some general guidelines. Calcium Recommendations Calcium is the most important (essential) mineral for bone health. Most people can get enough calcium from their diet, but supplements may be recommended for people who are at risk for osteoporosis. Good sources of calcium include:  Dairy products, such as low-fat or nonfat milk, cheese, and yogurt.  Dark green leafy vegetables, such as bok choy and broccoli.  Calcium-fortified foods, such as orange juice, cereal, bread, soy beverages, and tofu products.  Nuts, such as almonds.  Follow these recommended amounts for daily calcium intake:  Children, age 1?3: 700 mg.  Children, age 4?8: 1,000 mg.  Children, age 9?13: 1,300 mg.  Teens, age 14?18: 1,300 mg.  Adults, age 19?50: 1,000 mg.  Adults, age 51?70: ? Men: 1,000 mg. ? Women: 1,200 mg.  Adults, age 71 or older: 1,200 mg.  Pregnant and breastfeeding females: ? Teens: 1,300 mg. ? Adults: 1,000 mg.  Vitamin D Recommendations Vitamin D is the most essential vitamin for bone health. It helps the body to absorb calcium. Sunlight stimulates the skin to make vitamin D, so be sure to get enough sunlight. If you live in a cold climate or you do not get outside often, your health care provider may recommend that you take vitamin   D supplements. Good sources of vitamin D in your diet include:  Egg yolks.  Saltwater fish.  Milk and cereal fortified with vitamin D.  Follow these recommended amounts for daily vitamin D intake:  Children and teens, age 1?18: 600 international units.  Adults, age 50 or younger: 400-800 international units.  Adults, age 51 or older: 800-1,000 international units.  Other Nutrients Other nutrients  for bone health include:  Phosphorus. This mineral is found in meat, poultry, dairy foods, nuts, and legumes. The recommended daily intake for adult men and adult women is 700 mg.  Magnesium. This mineral is found in seeds, nuts, dark green vegetables, and legumes. The recommended daily intake for adult men is 400?420 mg. For adult women, it is 310?320 mg.  Vitamin K. This vitamin is found in green leafy vegetables. The recommended daily intake is 120 mg for adult men and 90 mg for adult women.  What type of physical activity is best for building and maintaining healthy bones? Weight-bearing and strength-building activities are important for building and maintaining peak bone mass. Weight-bearing activities cause muscles and bones to work against gravity. Strength-building activities increases muscle strength that supports bones. Weight-bearing and muscle-building activities include:  Walking and hiking.  Jogging and running.  Dancing.  Gym exercises.  Lifting weights.  Tennis and racquetball.  Climbing stairs.  Aerobics.  Adults should get at least 30 minutes of moderate physical activity on most days. Children should get at least 60 minutes of moderate physical activity on most days. Ask your health care provide what type of exercise is best for you. Where can I find more information? For more information, check out the following websites:  National Osteoporosis Foundation: http://nof.org/learn/basics  National Institutes of Health: http://www.niams.nih.gov/Health_Info/Bone/Bone_Health/bone_health_for_life.asp  This information is not intended to replace advice given to you by your health care provider. Make sure you discuss any questions you have with your health care provider. Document Released: 08/13/2003 Document Revised: 12/11/2015 Document Reviewed: 05/28/2014 Elsevier Interactive Patient Education  2018 Elsevier Inc.  

## 2017-09-26 NOTE — Progress Notes (Signed)
CLINIC:  Survivorship   REASON FOR VISIT:  Routine follow-up for history of breast cancer.   BRIEF ONCOLOGIC HISTORY:   1.  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  Adjuvant radiation therapy by Dr. Valere Dross  3.  Genetic testing negative for BRCA1 and 2   4.  Tamoxifen 20 mg started 09/27/2011-09/2017  INTERVAL HISTORY:  Maria Bolton presents to the [facility] today for routine follow-up for her history of breast cancer.  She is accompanied by her husband today.  Overall, she reports feeling quite well. She is doing well today.  She continues to have chronic breast soreness.  She is unsure if she is up to date with her mammograms, and she is unaware if she is up to date with her other cancer screenings.  She cannot remember if she has seen her PCP this year or if she has had blood drawn.  She continues on Tamoxifen and wants to continue taking this.  She was supposed to stop the Tamoxifen.  She is unsure if she has seen the gynecologist this year as it is one of her recommendations.      REVIEW OF SYSTEMS:  Review of Systems  Constitutional: Negative for appetite change, chills, fatigue, fever and unexpected weight change.  HENT:   Negative for hearing loss, lump/mass and sore throat.   Eyes: Negative for eye problems and icterus.  Respiratory: Negative for chest tightness, cough and shortness of breath.   Cardiovascular: Negative for chest pain, leg swelling and palpitations.  Gastrointestinal: Negative for abdominal distention, abdominal pain, constipation, diarrhea, nausea and vomiting.  Endocrine: Positive for hot flashes (reported by patient husband).  Neurological: Negative for dizziness, extremity weakness, headaches and numbness.  Hematological: Negative for adenopathy. Does not bruise/bleed easily.  Psychiatric/Behavioral: Negative for depression. The patient is not nervous/anxious.   Breast: Denies  any new nodularity, masses, tenderness, nipple changes, or nipple discharge.       PAST MEDICAL/SURGICAL HISTORY:  Past Medical History:  Diagnosis Date  . Breast cancer ([facility]) 04/2011   DCIS left breast , ER/PR+  . Difficulty sleeping    since dx of cancer  . Hypertension 05/11/2011  . Memory loss    Past Surgical History:  Procedure Laterality Date  . BREAST LUMPECTOMY  05/16/2011   Procedure: LUMPECTOMY;  Surgeon: Shann Medal, MD;  Location: [facility];  Service: General;  Laterality: Left;  Needle localization Left breast lumpectomy  . [place]  . NEVUS EXCISION  05/16/2011   Procedure: NEVUS EXCISION;  Surgeon: Shann Medal, MD;  Location: [facility];  Service: General;  Laterality: Left;  remove mole left breast     ALLERGIES:  No Known Allergies   CURRENT MEDICATIONS:  Outpatient Encounter Medications as of 09/26/2017  Medication Sig  . cholecalciferol (VITAMIN D) 1000 units tablet Take 1,000 Units by mouth daily.  Marland Kitchen donepezil (ARICEPT) 10 MG tablet Take 1 tablet (10 mg total) by mouth at bedtime.  Marland Kitchen losartan (COZAAR) 100 MG tablet Take 100 mg by mouth daily.    . memantine (NAMENDA) 10 MG tablet Take 1 tablet (10 mg total) by mouth 2 (two) times daily.  . tamoxifen (NOLVADEX) 20 MG tablet TAKE 1 TABLET(20 MG) BY MOUTH DAILY  . ketoconazole (NIZORAL) 2 % cream Apply 1 application topically daily.   No facility-administered encounter medications on file as of 09/26/2017.  ONCOLOGIC FAMILY HISTORY:  Family History  Problem Relation Age of Onset  . Cancer Mother 29       breast ca  . Alzheimer's disease Father   . Cancer Maternal Aunt        breast  . Cancer Maternal Uncle        breast  . Cancer Cousin        breast      SOCIAL HISTORY:  Social History   Socioeconomic History  . Marital status: Married    Spouse name: Not on file  . Number of children: 1  . Years of education: HS  . Highest education  level: Not on file  Occupational History  . Occupation: Retired  Scientific laboratory technician  . Financial resource strain: Not on file  . Food insecurity:    Worry: Not on file    Inability: Not on file  . Transportation needs:    Medical: Not on file    Non-medical: Not on file  Tobacco Use  . Smoking status: Former Smoker    Packs/day: 1.00    Years: 46.00    Pack years: 46.00    Types: Cigarettes    Last attempt to quit: 11/04/2009    Years since quitting: 7.9  . Smokeless tobacco: Never Used  Substance and Sexual Activity  . Alcohol use: No  . Drug use: No  . Sexual activity: Yes  Lifestyle  . Physical activity:    Days per week: Not on file    Minutes per session: Not on file  . Stress: Not on file  Relationships  . Social connections:    Talks on phone: Not on file    Gets together: Not on file    Attends religious service: Not on file    Active member of club or organization: Not on file    Attends meetings of clubs or organizations: Not on file    Relationship status: Not on file  . Intimate partner violence:    Fear of current or ex partner: Not on file    Emotionally abused: Not on file    Physically abused: Not on file    Forced sexual activity: Not on file  Other Topics Concern  . Not on file  Social History Narrative   Lives at home with husband.   Right-handed.   No caffeine use.        PHYSICAL EXAMINATION:  Vital Signs: Vitals:   09/26/17 1458  BP: (!) 148/82  Pulse: 80  Resp: 17  Temp: 98.8 F (37.1 C)  SpO2: 97%   Filed Weights   09/26/17 1458  Weight: 246 lb 14.4 oz (112 kg)   General: Well-nourished, well-appearing female in no acute distress.  Unaccompanied today.   HEENT: Head is normocephalic.  Pupils equal and reactive to light. Conjunctivae clear without exudate.  Sclerae anicteric. Oral mucosa is pink, moist.  Oropharynx is pink without lesions or erythema.  Lymph: No cervical, supraclavicular, or infraclavicular lymphadenopathy noted on  palpation.  Cardiovascular: Regular rate and rhythm.Marland Kitchen Respiratory: Clear to auscultation bilaterally. Chest expansion symmetric; breathing non-labored.  Breast Exam:  -Left breast: No appreciable masses on palpation. No skin redness, thickening, or peau d'orange appearance; no nipple retraction or nipple discharge; mild distortion in symmetry at previous lumpectomy site well healed scar without erythema or nodularity.  -Right breast: No appreciable masses on palpation. No skin redness, thickening, or peau d'orange appearance; no nipple retraction or nipple discharge; -Axilla: No axillary adenopathy bilaterally.  GI: Abdomen soft and round; non-tender, non-distended. Bowel sounds normoactive. No hepatosplenomegaly.   GU: Deferred.  Neuro: No focal deficits. Steady gait.  Psych: Mood and affect normal and appropriate for situation.  MSK: No focal spinal tenderness to palpation, full range of motion in bilateral upper extremities Extremities: No edema. Skin: Warm and dry.  LABORATORY DATA:  None for this visit   DIAGNOSTIC IMAGING:  Most recent mammogram: At [facility], awaiting fax    ASSESSMENT AND PLAN:  Maria Bolton is a pleasant 72 y.o. female with history of Stage 0 left breast DCIS, ER+, diagnosed in 04/2011, treated with lumpectomy, adjuvant radiation therapy, and anti-estrogen therapy with Tamoxifen therapy in 09/2016.  She [facility] for surveillance and routine follow-up.   1. History of breast cancer:  Maria Bolton is currently clinically and radiographically without evidence of disease or recurrence of breast cancer. She will be due for mammogram in 07/2018.  She has elected to continue anti estrogen therapy for prevention.  At first, I mistakenly thought she had h/o invasive breast cancer and told her that it may give her some benefit, however I clarified this once I realized she had DCIS, and recommended she stop.  I informed her of the risks involved, particularly  since she is having a very hard time remembering her health maintenance appointments and screenings to mitigate some of the risks involved with taking Tamoxifen. She will return in one year for LTS follow up.  I encouraged her to call me with any questions or concerns before her next visit at the cancer center, and I would be happy to see her sooner, if needed.    2. Bone health:  Given Maria Bolton age, history of breast cancer, she is at risk for bone demineralization.  She was given education on specific food and activities to promote bone health.  3. Cancer screening:  Due to Maria Bolton history and her age, she should receive screening for skin cancers, colon cancer, and gynecologic cancers. She was encouraged to follow-up with her PCP for appropriate cancer screenings.   4. Health maintenance and wellness promotion: Maria Bolton was encouraged to consume 5-7 servings of fruits and vegetables per day. She was also encouraged to engage in moderate to vigorous exercise for 30 minutes per day most days of the week. She was instructed to limit her alcohol consumption and continue to abstain from tobacco use.    Dispo:  -Return to cancer center in one year for LTS follow up -Mammogram in 07/2018   A total of (30) minutes of face-to-face time was spent with this patient with greater than 50% of that time in counseling and care-coordination.   Gardenia Phlegm, NP [facility] [phone]   Note: PRIMARY CARE PROVIDER Thressa Sheller, [place] [phone]

## 2017-09-26 NOTE — Telephone Encounter (Signed)
Gave patient AVs and calendar of upcoming April 2020 appointments.  °

## 2017-10-27 NOTE — Progress Notes (Signed)
[facility]  PATIENT: Maria Bolton DOB: 05/04/1946   REASON FOR VISIT: Follow-up for mild cognitive impairment HISTORY FROM: Patient and husband   HISTORY OF PRESENT ILLNESS: Maria Bolton is a 72 years old right-handed female, accompanied by her husband, seen in refer by her cardiologist Dr. Adrian Prows for evaluation of memory loss, her primary care physician is Dr. Teena Irani, initial evaluation was on September 06 2016.  I reviewed and are summarized referring note, she had a history of chronic diastolic heart failure, hyperglycemia, but not taking any medications for diabetes, COPD, hypothyroidism, vitamin D deficiency, psoriatic arthritis, left breast cancer, status post lobectomy in 2012 followed by radiation therapy, no chemotherapy is required.  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  Mother died of breast cancer at age 72, her father died at age 89, did suffer severe Alzheimer's disease.   Around 2016, she noticed she has tendency to forget peoples name, misplaced things, got confused while driving in a familiar route, She is still very active at home, does yard work, used to Programmer, multimedia, in 2016, normal CBC creatinine of 0.69, potassium of 3.8, normal CMP, A1c 5.9, total cholesterol 163, LDL 88, normal TSH 0.57, vitamin D 49.9, next  Echocardiogram December 2016, left ventricular cavity is normal, moderate concentric hypertrophy of the left ventricle, normal global [place] motion, ejection fraction 16%, grade 1 diastolic dysfunction.  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  She continue struggle with short-term memory loss, Mini-Mental Status is 27/30, missed 3 out of 3 recall,  We have personally reviewed MRI of the brain in April 2018: Generalized atrophy, mild supratentorium small vessel disease  UPDATE May 02 2017:YY  I have reviewed and summarized note from her endocrinologist Dr. Chalmers Cater, Mindi Curling dated November 25 2016, hyperthyroidism, anxiety, thyrotoxicosis without thyroid toxic crisis or storm, she was treated for radioactive iodine on December 01 2016, treatment of toxic multinodular goiter  She is tolerating Aricept 10 mg every day, Namenda 10 mg twice a day, her husband reported mild worsening memory loss,  I reviewed the laboratory evaluations in October 2018, cholesterol 156, LDL 68, normal TSH, vitamin D was 49, hemoglobin was 13.3, creatinine was 0.72, UPDATE 5/28/2019CM Maria Bolton, 72 year old female returns for follow-up with history of mild cognitive impairment.  She continues to cook she is independent in all activities of daily living.  She continues to do the finances.  She continues to drive a car without getting lost.  She continues to help her blood work.  She is currently on Aricept and Namenda tolerating without side effects she needs refills.  She returns for reevaluation  REVIEW OF SYSTEMS: Full 14 system review of systems performed and notable only for those listed, all others are neg:  Constitutional: neg  Cardiovascular: neg Ear/Nose/Throat: neg  Skin: neg Eyes: neg Respiratory: neg Gastroitestinal: neg  Hematology/Lymphatic: neg  Endocrine: neg Musculoskeletal:neg Allergy/Immunology: neg Neurological: Memory loss Psychiatric: neg Sleep : neg   ALLERGIES: No Known Allergies  HOME MEDICATIONS: Outpatient Medications Prior to Visit  Medication Sig Dispense Refill  . cholecalciferol (VITAMIN D) 1000 units  tablet Take 1,000 Units by mouth daily.    Marland Kitchen donepezil (ARICEPT) 10 MG tablet Take 1 tablet (10 mg total) by mouth at bedtime. 30 tablet 11  . ketoconazole (NIZORAL) 2 % cream Apply 1 application topically daily. 15 g 0  . losartan (COZAAR) 100 MG tablet Take 100 mg by mouth daily.      . memantine (NAMENDA) 10 MG tablet Take 1 tablet (10 mg total) by mouth 2 (two) times daily. 60 tablet 11   No facility-administered medications prior to visit.     PAST MEDICAL HISTORY: Past Medical History:  Diagnosis Date  . 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 Difficulty sleeping    since dx of cancer  . Hypertension 05/11/2011  . Memory loss     PAST SURGICAL HISTORY: Past Surgical History:  Procedure Laterality Date  . BREAST LUMPECTOMY  05/16/2011   Procedure: LUMPECTOMY;  Surgeon: Shann Medal, MD;  Location: [facility];  Service: General;  Laterality: Left;  Needle localization Left breast lumpectomy  . [place]  . NEVUS EXCISION  05/16/2011   Procedure: NEVUS EXCISION;  Surgeon: Shann Medal, MD;  Location: [facility];  Service: General;  Laterality: Left;  remove mole left breast    FAMILY HISTORY: Family History  Problem Relation Age of Onset  . Cancer Mother 28       breast ca  . Alzheimer's disease Father   . Cancer Maternal Aunt        breast  . Cancer Maternal Uncle        breast  . Cancer Cousin        breast    SOCIAL HISTORY: Social History   Socioeconomic History  . Marital status: Married    Spouse name: Not on file  . Number of children: 1  . Years of education: HS  . Highest education level: Not on file  Occupational History  . Occupation: Retired  Scientific laboratory technician  . Financial resource strain: Not on file  . Food insecurity:    Worry: Not on file    Inability: Not on file  . Transportation needs:    Medical: Not on file    Non-medical: Not on file  Tobacco Use  . Smoking status: Former  Smoker    Packs/day: 1.00    Years: 46.00    Pack years: 46.00    Types: Cigarettes    Last attempt to quit: 11/04/2009    Years since quitting: 7.9  . Smokeless tobacco: Never Used  Substance and Sexual Activity  . Alcohol use: No  . Drug use: No  . Sexual activity: Yes  Lifestyle  . Physical activity:    Days per week: Not on file    Minutes per session: Not on file  . Stress: Not on file  Relationships  . Social connections:    Talks on phone: Not on file    Gets together: Not on file    Attends religious service: Not on file    Active member of club or organization: Not on file    Attends meetings of clubs or organizations: Not on file    Relationship status: Not on file  . Intimate partner violence:    Fear of current or ex partner: Not on file    Emotionally abused: Not on file    Physically abused:  Not on file    Forced sexual activity: Not on file  Other Topics Concern  . Not on file  Social History Narrative   Lives at home with husband.   Right-handed.   No caffeine use.     PHYSICAL EXAM  Vitals:   10/31/17 0939  BP: (!) 160/80  Pulse: 64  Weight: 248 lb (112.5 kg)  Height: 5\' 6"  (1.676 m)   Body mass index is 40.03 kg/m.  Generalized: Well developed, in no acute distress  Head: normocephalic and atraumatic,. Oropharynx benign  Neck: Supple, no carotid bruits  Cardiac: Regular rate rhythm, no murmur  Musculoskeletal: No deformity   Neurological examination   Mentation: Alert .  MMSE - Mini Mental State Exam 10/31/2017 05/02/2017 12/27/2016  Orientation to time 5 5 5   Orientation to Place 5 5 5   Registration 3 3 3   Attention/ Calculation 4 5 5   Recall 1 2 0  Language- name 2 objects 2 2 2   Language- repeat 1 1 1   Language- follow 3 step command 3 3 3   Language- read & follow direction 1 1 1   Write a sentence 1 1 1   Copy design 1 1 1   Total score 27 29 27    Follows all commands speech and language fluent.   Cranial nerve II-XII: Pupils  were equal round reactive to light extraocular movements were full, visual field were full on confrontational test. Facial sensation and strength were normal. hearing was intact to finger rubbing bilaterally. Uvula tongue midline. head turning and shoulder shrug were normal and symmetric.Tongue protrusion into cheek strength was normal. Motor: normal bulk and tone, full strength in the BUE, BLE, Sensory: normal and symmetric to light touch,  Coordination: finger-nose-finger, heel-to-shin bilaterally, no dysmetria Reflexes: Symmetric upper and lower plantar responses were flexor bilaterally. Gait and Station: Rising up from seated position without assistance, normal stance,  moderate stride, good arm swing, smooth turning, able to perform tiptoe, and heel walking without difficulty. Tandem gait is steady.  No assistive device  DIAGNOSTIC DATA (LABS, IMAGING, TESTING) - I reviewed patient records, labs, notes, testing and imaging myself where available.  Lab Results  Component Value Date   WBC 7.1 09/06/2016   HGB 13.6 09/06/2016   HCT 41.3 09/06/2016   MCV 88 09/06/2016   PLT 170 09/06/2016      Component Value Date/Time   NA 144 09/06/2016 1109   NA 144 02/25/2014 0931   K 3.8 09/06/2016 1109   K 3.7 02/25/2014 0931   CL 105 09/06/2016 1109   CL 109 (H) 07/30/2012 0824   CO2 24 09/06/2016 1109   CO2 25 02/25/2014 0931   GLUCOSE 94 09/06/2016 1109   GLUCOSE 113 (H) 09/18/2015 2329   GLUCOSE 105 02/25/2014 0931   GLUCOSE 100 (H) 07/30/2012 0824   BUN 19 09/06/2016 1109   BUN 13.7 02/25/2014 0931   CREATININE 0.73 09/06/2016 1109   CREATININE 0.8 02/25/2014 0931   CALCIUM 9.2 09/06/2016 1109   CALCIUM 8.7 02/25/2014 0931   PROT 6.9 09/06/2016 1109   PROT 6.6 02/25/2014 0931   ALBUMIN 3.7 09/06/2016 1109   ALBUMIN 3.1 (L) 02/25/2014 0931   AST 24 09/06/2016 1109   AST 20 02/25/2014 0931   ALT 20 09/06/2016 1109   ALT 13 02/25/2014 0931   ALKPHOS 69 09/06/2016 1109   ALKPHOS  64 02/25/2014 0931   BILITOT 0.2 09/06/2016 1109   BILITOT 0.36 02/25/2014 0931   GFRNONAA 85 09/06/2016 1109   GFRAA  98 09/06/2016 1109    Lab Results  Component Value Date   HGBA1C 5.4 09/06/2016   Lab Results  Component Value Date   [id number] 518 09/06/2016   Lab Results  Component Value Date   TSH 1.200 12/27/2016      ASSESSMENT AND PLAN   Abrie Egloff is a 72y.o. 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  Her memory score is stable    Continue Aricept, Namenda will refill Encourage her moderate exercise F/U in 6 months Dennie Bible, [facility], [facility], [facility] [address] [place], [place] [address] [phone]

## 2017-10-31 ENCOUNTER — Ambulatory Visit: Payer: Medicare Other | Admitting: Nurse Practitioner

## 2017-10-31 ENCOUNTER — Encounter: Payer: Self-pay | Admitting: Nurse Practitioner

## 2017-10-31 VITALS — BP 160/80 | HR 64 | Ht 66.0 in | Wt 248.0 lb

## 2017-10-31 DIAGNOSIS — G3184 Mild cognitive impairment, so stated: Secondary | ICD-10-CM | POA: Diagnosis not present

## 2017-10-31 MED ORDER — MEMANTINE HCL 10 MG PO TABS: 10.0000 mg | ORAL_TABLET | Freq: Two times a day (BID) | ORAL | 11 refills | Status: DC

## 2017-10-31 MED ORDER — DONEPEZIL HCL 10 MG PO TABS: 10.0000 mg | ORAL_TABLET | Freq: Every day | ORAL | 11 refills | Status: DC

## 2017-10-31 NOTE — Patient Instructions (Signed)
Memory score is stable Continue Aricept, Namenda will refill Encourage her moderate exercise F/U in 6 months

## 2017-11-01 NOTE — Progress Notes (Signed)
I have reviewed and agreed above plan. 

## 2017-11-26 DIAGNOSIS — L03119 Cellulitis of unspecified part of limb: Secondary | ICD-10-CM | POA: Diagnosis not present

## 2017-11-26 DIAGNOSIS — M25571 Pain in right ankle and joints of right foot: Secondary | ICD-10-CM | POA: Diagnosis not present

## 2017-11-26 DIAGNOSIS — M129 Arthropathy, unspecified: Secondary | ICD-10-CM | POA: Diagnosis not present

## 2017-11-26 DIAGNOSIS — Z79899 Other long term (current) drug therapy: Secondary | ICD-10-CM | POA: Diagnosis not present

## 2018-04-09 DIAGNOSIS — E559 Vitamin D deficiency, unspecified: Secondary | ICD-10-CM | POA: Diagnosis not present

## 2018-04-09 DIAGNOSIS — I129 Hypertensive chronic kidney disease with stage 1 through stage 4 chronic kidney disease, or unspecified chronic kidney disease: Secondary | ICD-10-CM | POA: Diagnosis not present

## 2018-04-09 DIAGNOSIS — N390 Urinary tract infection, site not specified: Secondary | ICD-10-CM | POA: Diagnosis not present

## 2018-04-12 DIAGNOSIS — R3129 Other microscopic hematuria: Secondary | ICD-10-CM | POA: Diagnosis not present

## 2018-04-12 DIAGNOSIS — I700 Atherosclerosis of aorta: Secondary | ICD-10-CM | POA: Diagnosis not present

## 2018-04-12 DIAGNOSIS — J449 Chronic obstructive pulmonary disease, unspecified: Secondary | ICD-10-CM | POA: Diagnosis not present

## 2018-04-12 DIAGNOSIS — I5032 Chronic diastolic (congestive) heart failure: Secondary | ICD-10-CM | POA: Diagnosis not present

## 2018-04-12 DIAGNOSIS — N390 Urinary tract infection, site not specified: Secondary | ICD-10-CM | POA: Diagnosis not present

## 2018-04-12 DIAGNOSIS — Z0000 Encounter for general adult medical examination without abnormal findings: Secondary | ICD-10-CM | POA: Diagnosis not present

## 2018-04-24 DIAGNOSIS — M25561 Pain in right knee: Secondary | ICD-10-CM | POA: Diagnosis not present

## 2018-04-24 DIAGNOSIS — M1990 Unspecified osteoarthritis, unspecified site: Secondary | ICD-10-CM | POA: Diagnosis not present

## 2018-04-24 DIAGNOSIS — R7309 Other abnormal glucose: Secondary | ICD-10-CM | POA: Diagnosis not present

## 2018-04-24 DIAGNOSIS — M170 Bilateral primary osteoarthritis of knee: Secondary | ICD-10-CM | POA: Diagnosis not present

## 2018-04-24 DIAGNOSIS — I10 Essential (primary) hypertension: Secondary | ICD-10-CM | POA: Diagnosis not present

## 2018-05-04 IMAGING — CT CT CHEST LUNG CANCER SCREENING LOW DOSE W/O CM
1 series · 10 of 10 positions shown, 13 images · non-contrast
Comparison: None.

CLINICAL DATA: 71-year-old female former smoker, quit 7 years ago,
with 46 pack-year history of smoking, for initial lung cancer
screening

EXAM:
CT CHEST WITHOUT CONTRAST LOW-DOSE FOR LUNG CANCER SCREENING
TECHNIQUE: Multidetector CT imaging of the chest was performed following the
standard protocol without IV contrast.

[ct lung segmentation data · axial · 0.82mm/px · z∈[-333,-333]mm · 10 of 308 frames shown]
[frame 1/308  mediastinal]
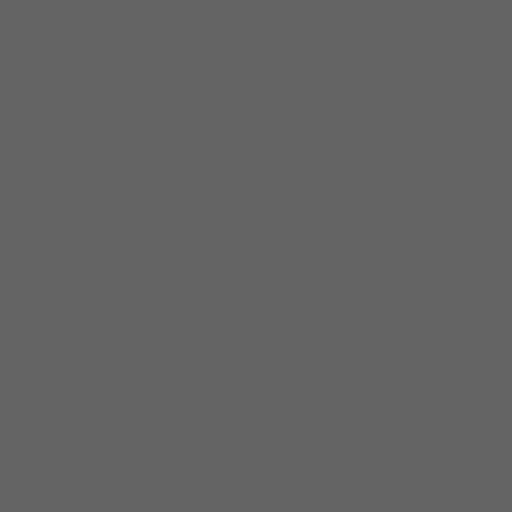
[frame 1/308  lung]
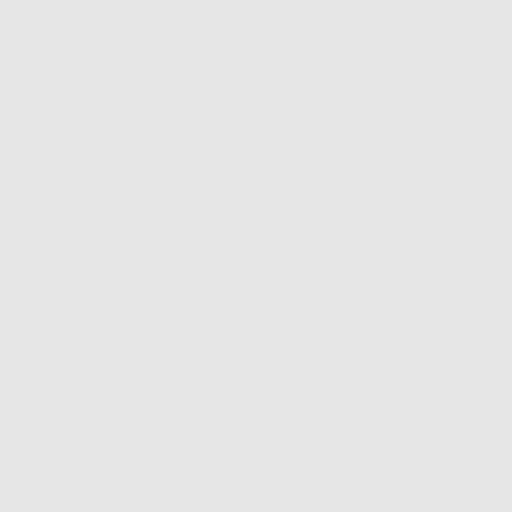
[frame 35/308  lung]
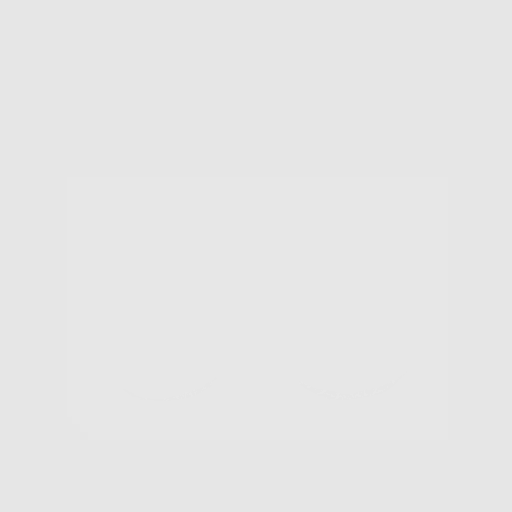
[frame 69/308  lung]
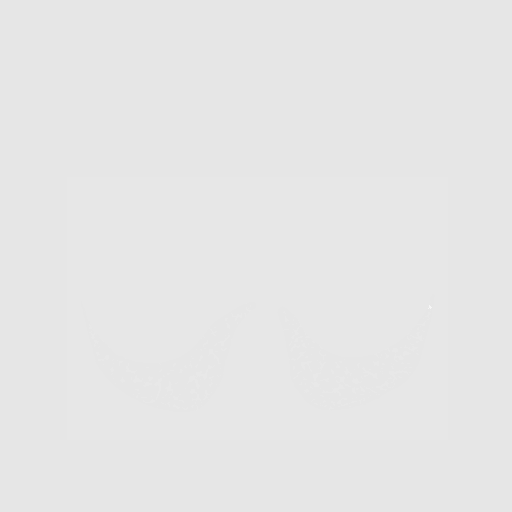
[frame 103/308  lung]
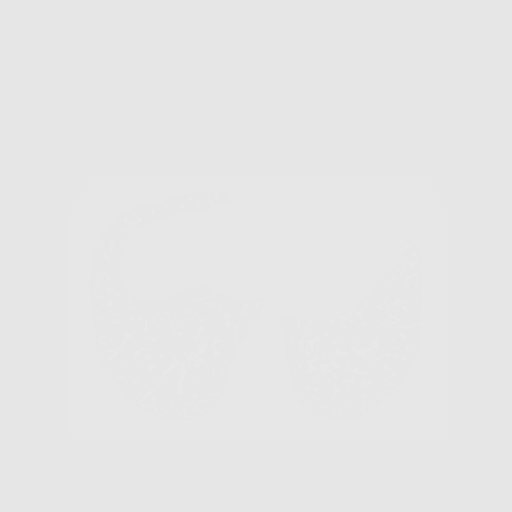
[frame 137/308  mediastinal]
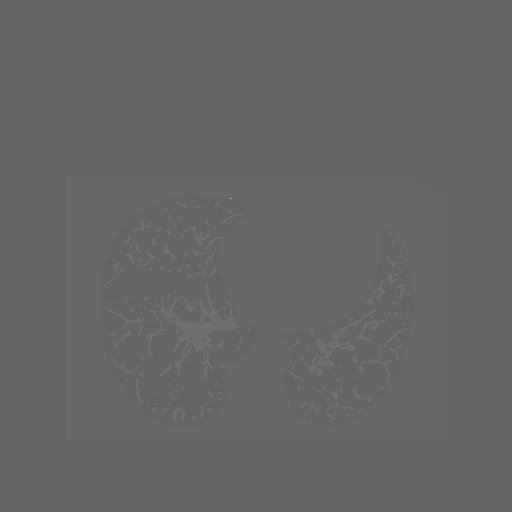
[frame 137/308  lung]
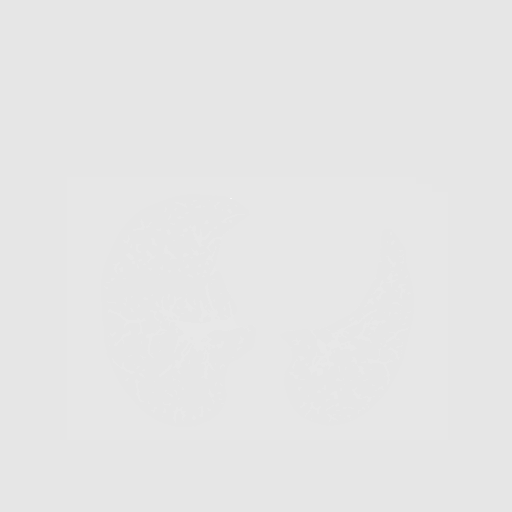
[frame 171/308  lung]
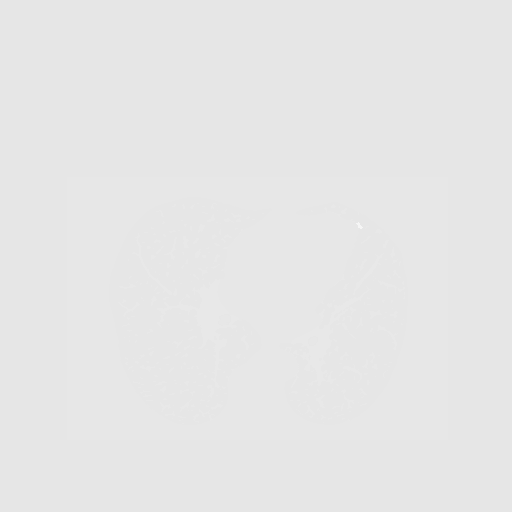
[frame 205/308  lung]
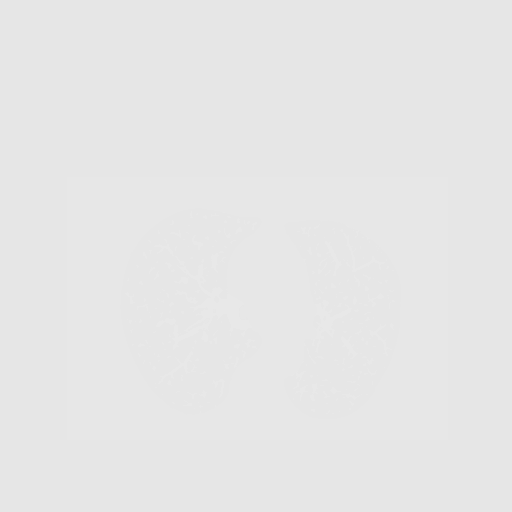
[frame 239/308  lung]
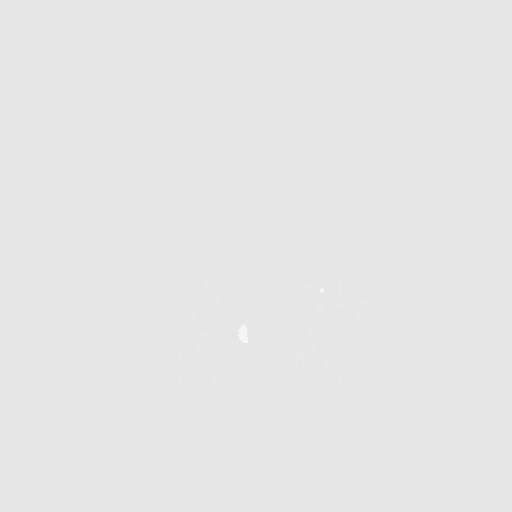
[frame 273/308  mediastinal]
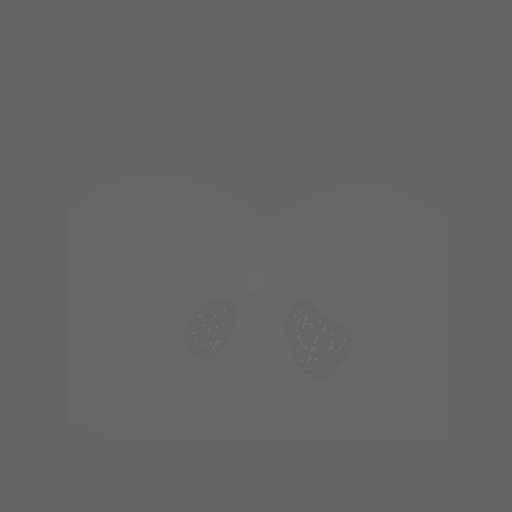
[frame 273/308  lung]
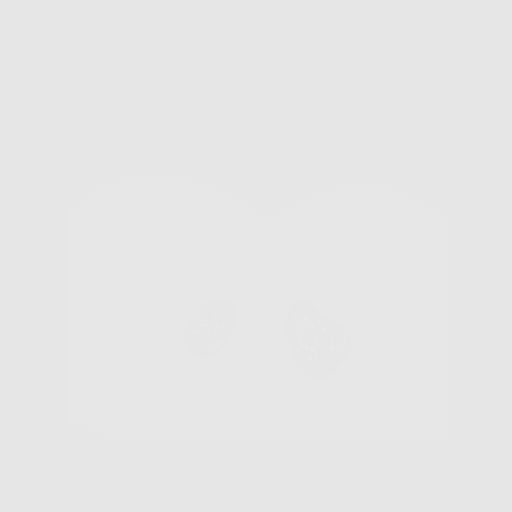
[frame 308/308  lung]
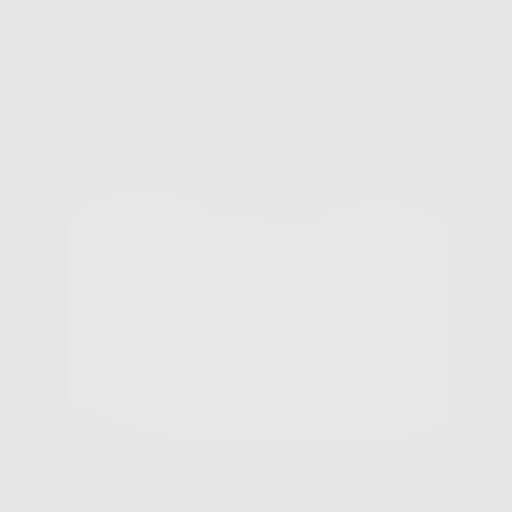

[10 of 10 positions shown; findings below may reference images not displayed]

FINDINGS: Cardiovascular: The heart is normal in size. No pericardial
effusion.

No evidence of thoracic aortic aneurysm. Mild atherosclerotic
calcifications aortic arch.

Aberrant right subclavian artery.

Mild coronary atherosclerosis the LAD.

Mediastinum/Nodes: No suspicious mediastinal lymphadenopathy.

Visualized thyroid is unremarkable.

Lungs/Pleura: Mild centrilobular emphysematous changes, upper lobe
predominant.

No focal consolidation. Mild linear scarring in the left lower lobe.

Scattered small left lung nodules measuring up to 5.3 mm. Additional
calcified granuloma in the left lung base, benign.

No pleural effusion or pneumothorax.

Upper Abdomen: Visualized upper abdomen is notable for hepatic cysts
measuring up to 2.0 cm and cholelithiasis.

Musculoskeletal: Mild degenerative changes of the visualized
thoracolumbar spine.
IMPRESSION: Lung-RADS 2, benign appearance or behavior. Continue annual
screening with low-dose chest CT without contrast in 12 months.

Aortic Atherosclerosis (JXSDP-2PO.O) and Emphysema ([id number].E).

## 2018-05-14 NOTE — Progress Notes (Signed)
[facility]  PATIENT: Maria Bolton DOB: 1946/03/31   REASON FOR VISIT: Follow-up for mild cognitive impairment HISTORY FROM: Patient and husband   HISTORY OF PRESENT ILLNESS: Maria Bolton is a 72 years old right-handed female, accompanied by her husband, seen in refer by her cardiologist Maria Bolton for evaluation of memory loss, her primary care physician is Maria Bolton, initial evaluation was on September 06 2016.  I reviewed and are summarized referring note, she had a history of chronic diastolic heart failure, hyperglycemia, but not taking any medications for diabetes, COPD, hypothyroidism, vitamin D deficiency, psoriatic arthritis, left breast cancer, status post lobectomy in 2012 followed by radiation therapy, no chemotherapy is required.  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  Mother died of breast cancer at age 54, her father died at age 58, did suffer severe Alzheimer's disease.   Around 2016, she noticed she has tendency to forget peoples name, misplaced things, got confused while driving in a familiar route, She is still very active at home, does yard work, used to Programmer, multimedia, in 2016, normal CBC creatinine of 0.69, potassium of 3.8, normal CMP, A1c 5.9, total cholesterol 163, LDL 88, normal TSH 0.57, vitamin D 49.9, next  Echocardiogram December 2016, left ventricular cavity is normal, moderate concentric hypertrophy of the left ventricle, normal global [place] motion, ejection fraction 15%, grade 1 diastolic dysfunction.  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  She continue struggle with short-term memory loss, Mini-Mental Status is 27/30, missed 3 out of 3 recall,  We have personally reviewed MRI of the brain in April 2018: Generalized atrophy, mild supratentorium small vessel disease  UPDATE May 02 2017:Maria Bolton  I have reviewed and summarized note from her endocrinologist Maria Bolton, Maria Bolton dated November 25 2016, hyperthyroidism, anxiety, thyrotoxicosis without thyroid toxic crisis or storm, she was treated for radioactive iodine on December 01 2016, treatment of toxic multinodular goiter  She is tolerating Aricept 10 mg every day, Namenda 10 mg twice a day, her husband reported mild worsening memory loss,  I reviewed the laboratory evaluations in October 2018, cholesterol 156, LDL 68, normal TSH, vitamin D was 49, hemoglobin was 13.3, creatinine was 0.72, UPDATE 5/28/2019CM Ms. Bolton, 72 year old female returns for follow-up with history of mild cognitive impairment.  She continues to cook she is independent in all activities of daily living.  She continues to do the finances.  She continues to drive a car without getting lost.   She is currently on Aricept and Namenda tolerating without side effects she needs refills.  She returns for reevaluation UPDATE 12/10/2019CM Ms. Bolton, 72 year old female returns for follow-up with a history of mild cognitive impairment.  She continues to do the finances.  She continues to be independent in all activities of daily living.  She continues to drive a car without getting lost she continues to cook.  No safety issues identified.  She remains on Namenda and Aricept without side effects.  She returns for reevaluation REVIEW OF SYSTEMS: Full 14 system review of  systems performed and notable only for those listed, all others are neg:  Constitutional: neg  Cardiovascular: neg Ear/Nose/Throat: neg  Skin:  neg Eyes: neg Respiratory: neg Gastroitestinal: neg  Hematology/Lymphatic: neg  Endocrine: neg Musculoskeletal:neg Allergy/Immunology: neg Neurological: Memory loss Psychiatric: neg Sleep : neg   ALLERGIES: No Known Allergies  HOME MEDICATIONS: Outpatient Medications Prior to Visit  Medication Sig Dispense Refill  . cholecalciferol (VITAMIN D) 1000 units tablet Take 1,000 Units by mouth daily.    Marland Kitchen donepezil (ARICEPT) 10 MG tablet Take 1 tablet (10 mg total) by mouth at bedtime. 30 tablet 11  . ketoconazole (NIZORAL) 2 % cream Apply 1 application topically daily. 15 g 0  . losartan (COZAAR) 100 MG tablet Take 100 mg by mouth daily.      . memantine (NAMENDA) 10 MG tablet Take 1 tablet (10 mg total) by mouth 2 (two) times daily. 60 tablet 11   No facility-administered medications prior to visit.     PAST MEDICAL HISTORY: Past Medical History:  Diagnosis Date  . Breast cancer (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 Difficulty sleeping    since dx of cancer  . Hypertension 05/11/2011  . Memory loss     PAST SURGICAL HISTORY: Past Surgical History:  Procedure Laterality Date  . BREAST LUMPECTOMY  05/16/2011   Procedure: LUMPECTOMY;  Surgeon: Maria Medal, MD;  Location: [facility];  Service: General;  Laterality: Left;  Needle localization Left breast lumpectomy  . [place]  . NEVUS EXCISION  05/16/2011   Procedure: NEVUS EXCISION;  Surgeon: Maria Medal, MD;  Location: [facility];  Service: General;  Laterality: Left;  remove mole left breast    FAMILY HISTORY: Family History  Problem Relation Age of Onset  . Cancer Mother 33       breast ca  . Alzheimer's disease Father   . Cancer Maternal Aunt        breast  . Cancer Maternal Uncle        breast  . Cancer Cousin        breast    SOCIAL HISTORY: Social History   Socioeconomic History  . Marital status: Married    Spouse name: Not on file  . Number of  children: 1  . Years of education: HS  . Highest education level: Not on file  Occupational History  . Occupation: Retired  Scientific laboratory technician  . Financial resource strain: Not on file  . Food insecurity:    Worry: Not on file    Inability: Not on file  . Transportation needs:    Medical: Not on file    Non-medical: Not on file  Tobacco Use  . Smoking status: Former Smoker    Packs/day: 1.00    Years: 46.00    Pack years: 46.00    Types: Cigarettes    Last attempt to quit: 11/04/2009    Years since quitting: 8.5  . Smokeless tobacco: Never Used  Substance and Sexual Activity  . Alcohol use: No  . Drug use: No  . Sexual activity: Yes  Lifestyle  . Physical activity:    Days per week: Not on file    Minutes per session: Not on file  . Stress: Not on file  Relationships  . Social connections:    Talks on phone: Not on file    Gets together: Not on file    Attends religious service: Not on file  Active member of club or organization: Not on file    Attends meetings of clubs or organizations: Not on file    Relationship status: Not on file  . Intimate partner violence:    Fear of current or ex partner: Not on file    Emotionally abused: Not on file    Physically abused: Not on file    Forced sexual activity: Not on file  Other Topics Concern  . Not on file  Social History Narrative   Lives at home with husband.   Right-handed.   No caffeine use.     PHYSICAL EXAM  Vitals:   05/15/18 0851  BP: 136/62  Pulse: 69  Weight: 245 lb 12.8 oz (111.5 kg)  Height: 5\' 6"  (1.676 m)   Body mass index is 39.67 kg/m.  Generalized: Well developed, obese female in no acute distress  Head: normocephalic and atraumatic,. Oropharynx benign  Neck: Supple,  Musculoskeletal: No deformity   Neurological examination   Mentation: Alert .  MMSE - Mini Mental State Exam 05/15/2018 10/31/2017 05/02/2017  Not completed: (No Data) - -  Orientation to time 4 5 5   Orientation to Place 5  5 5   Registration 3 3 3   Attention/ Calculation 5 4 5   Recall 0 1 2  Language- name 2 objects 2 2 2   Language- repeat 1 1 1   Language- follow 3 step command 3 3 3   Language- read & follow direction 1 1 1   Write a sentence 1 1 1   Copy design 1 1 1   Total score 26 27 29     Follows all commands speech and language fluent.   Cranial nerve II-XII: Pupils were equal round reactive to light extraocular movements were full, visual field were full on confrontational test. Facial sensation and strength were normal. hearing was intact to finger rubbing bilaterally. Uvula tongue midline. head turning and shoulder shrug were normal and symmetric.Tongue protrusion into cheek strength was normal. Motor: normal bulk and tone, full strength in the BUE, BLE, Sensory: normal and symmetric to light touch,  Coordination: finger-nose-finger, heel-to-shin bilaterally, no dysmetria Reflexes: Symmetric upper and lower plantar responses were flexor bilaterally. Gait and Station: Rising up from seated position without assistance, normal stance,  moderate stride, good arm swing, smooth turning, able to perform tiptoe, and heel walking without difficulty. Tandem gait is steady.  No assistive device  DIAGNOSTIC DATA (LABS, IMAGING, TESTING) - I reviewed patient records, labs, notes, testing and imaging myself where available.  Lab Results  Component Value Date   WBC 7.1 09/06/2016   HGB 13.6 09/06/2016   HCT 41.3 09/06/2016   MCV 88 09/06/2016   PLT 170 09/06/2016      Component Value Date/Time   NA 144 09/06/2016 1109   NA 144 02/25/2014 0931   K 3.8 09/06/2016 1109   K 3.7 02/25/2014 0931   CL 105 09/06/2016 1109   CL 109 (H) 07/30/2012 0824   CO2 24 09/06/2016 1109   CO2 25 02/25/2014 0931   GLUCOSE 94 09/06/2016 1109   GLUCOSE 113 (H) 09/18/2015 2329   GLUCOSE 105 02/25/2014 0931   GLUCOSE 100 (H) 07/30/2012 0824   BUN 19 09/06/2016 1109   BUN 13.7 02/25/2014 0931   CREATININE 0.73 09/06/2016  1109   CREATININE 0.8 02/25/2014 0931   CALCIUM 9.2 09/06/2016 1109   CALCIUM 8.7 02/25/2014 0931   PROT 6.9 09/06/2016 1109   PROT 6.6 02/25/2014 0931   ALBUMIN 3.7 09/06/2016 1109   ALBUMIN 3.1 (  L) 02/25/2014 0931   AST 24 09/06/2016 1109   AST 20 02/25/2014 0931   ALT 20 09/06/2016 1109   ALT 13 02/25/2014 0931   ALKPHOS 69 09/06/2016 1109   ALKPHOS 64 02/25/2014 0931   BILITOT 0.2 09/06/2016 1109   BILITOT 0.36 02/25/2014 0931   GFRNONAA 85 09/06/2016 1109   GFRAA 98 09/06/2016 1109    Lab Results  Component Value Date   HGBA1C 5.4 09/06/2016   Lab Results  Component Value Date   [id number] 119 09/06/2016   Lab Results  Component Value Date   TSH 1.200 12/27/2016      ASSESSMENT AND PLAN   Shuntell Foody is a 72y.o. 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  Her memory score is stable at 26/30.     Continue Aricept, Namenda does not need refills Memory score is stable Encourage her moderate exercise Memory exercises F/U in 6 months Dennie Bible, [facility], [facility], APRN  [facility] [address] [place], [place] [address] [phone]

## 2018-05-15 ENCOUNTER — Ambulatory Visit: Payer: Medicare Other | Admitting: Nurse Practitioner

## 2018-05-15 ENCOUNTER — Encounter: Payer: Self-pay | Admitting: Nurse Practitioner

## 2018-05-15 VITALS — BP 136/62 | HR 69 | Ht 66.0 in | Wt 245.8 lb

## 2018-05-15 DIAGNOSIS — G3184 Mild cognitive impairment, so stated: Secondary | ICD-10-CM

## 2018-05-15 NOTE — Patient Instructions (Addendum)
Continue Aricept, Namenda  Memory score is stable Encourage her moderate exercise F/U in 6 months

## 2018-05-15 NOTE — Progress Notes (Signed)
I have reviewed and agreed above plan. 

## 2018-05-17 ENCOUNTER — Ambulatory Visit (INDEPENDENT_AMBULATORY_CARE_PROVIDER_SITE_OTHER)
Admission: RE | Admit: 2018-05-17 | Discharge: 2018-05-17 | Disposition: A | Discharge status: Home / Self Care | Payer: Medicare Other | Source: Ambulatory Visit | Attending: Acute Care | Admitting: Acute Care

## 2018-05-17 DIAGNOSIS — Z87891 Personal history of nicotine dependence: Secondary | ICD-10-CM | POA: Diagnosis not present

## 2018-05-17 DIAGNOSIS — Z122 Encounter for screening for malignant neoplasm of respiratory organs: Secondary | ICD-10-CM

## 2018-05-21 ENCOUNTER — Other Ambulatory Visit: Payer: Self-pay | Admitting: Acute Care

## 2018-05-21 DIAGNOSIS — Z122 Encounter for screening for malignant neoplasm of respiratory organs: Secondary | ICD-10-CM

## 2018-05-21 DIAGNOSIS — Z87891 Personal history of nicotine dependence: Secondary | ICD-10-CM

## 2018-05-23 DIAGNOSIS — I10 Essential (primary) hypertension: Secondary | ICD-10-CM | POA: Diagnosis not present

## 2018-05-23 DIAGNOSIS — I700 Atherosclerosis of aorta: Secondary | ICD-10-CM | POA: Diagnosis not present

## 2018-05-24 DIAGNOSIS — I700 Atherosclerosis of aorta: Secondary | ICD-10-CM | POA: Diagnosis not present

## 2018-05-24 DIAGNOSIS — Z1212 Encounter for screening for malignant neoplasm of rectum: Secondary | ICD-10-CM | POA: Diagnosis not present

## 2018-07-10 DIAGNOSIS — I700 Atherosclerosis of aorta: Secondary | ICD-10-CM | POA: Diagnosis not present

## 2018-08-09 DIAGNOSIS — E0500 Thyrotoxicosis with diffuse goiter without thyrotoxic crisis or storm: Secondary | ICD-10-CM | POA: Diagnosis not present

## 2018-08-09 DIAGNOSIS — I10 Essential (primary) hypertension: Secondary | ICD-10-CM | POA: Diagnosis not present

## 2018-08-09 DIAGNOSIS — E0590 Thyrotoxicosis, unspecified without thyrotoxic crisis or storm: Secondary | ICD-10-CM | POA: Diagnosis not present

## 2018-08-10 ENCOUNTER — Encounter: Payer: Self-pay | Admitting: Hematology and Oncology

## 2018-08-10 DIAGNOSIS — Z853 Personal history of malignant neoplasm of breast: Secondary | ICD-10-CM | POA: Diagnosis not present

## 2018-08-10 DIAGNOSIS — Z1231 Encounter for screening mammogram for malignant neoplasm of breast: Secondary | ICD-10-CM | POA: Diagnosis not present

## 2018-09-11 ENCOUNTER — Telehealth: Payer: Self-pay | Admitting: Adult Health

## 2018-09-11 NOTE — Telephone Encounter (Signed)
Rescheduled 4/21 appt per sch msg. Called and spoke with patient. Confirmed date and time

## 2018-09-25 ENCOUNTER — Other Ambulatory Visit: Payer: Medicare Other

## 2018-09-25 ENCOUNTER — Encounter: Payer: Medicare Other | Admitting: Adult Health

## 2018-11-14 ENCOUNTER — Ambulatory Visit (INDEPENDENT_AMBULATORY_CARE_PROVIDER_SITE_OTHER): Payer: Medicare Other | Admitting: Neurology

## 2018-11-14 ENCOUNTER — Other Ambulatory Visit: Payer: Self-pay

## 2018-11-14 ENCOUNTER — Encounter: Payer: Self-pay | Admitting: Neurology

## 2018-11-14 ENCOUNTER — Ambulatory Visit: Payer: Self-pay | Admitting: Neurology

## 2018-11-14 VITALS — BP 154/72 | HR 84 | Temp 98.2°F | Ht 66.0 in | Wt 244.6 lb

## 2018-11-14 DIAGNOSIS — G3184 Mild cognitive impairment, so stated: Secondary | ICD-10-CM

## 2018-11-14 MED ORDER — DONEPEZIL HCL 10 MG PO TABS: 10.0000 mg | ORAL_TABLET | Freq: Every day | ORAL | 11 refills | Status: DC

## 2018-11-14 MED ORDER — MEMANTINE HCL 10 MG PO TABS
10.0000 mg | ORAL_TABLET | Freq: Two times a day (BID) | ORAL | 11 refills | Status: DC
Start: 2018-11-14 — End: 2019-12-30

## 2018-11-14 NOTE — Progress Notes (Signed)
PATIENT: Maria Bolton DOB: [birth year]  REASON FOR VISIT: follow up HISTORY FROM: patient  HISTORY OF PRESENT ILLNESS: Today 11/14/18  HISTORY  Maria Bolton a 73 years old right-handed female, accompanied by her husband, seen in refer by her cardiologist Dr. Adrian Prows for evaluation of memory loss, her primary care physician is Dr. Teena Irani, initial evaluation was on September 06 2016.  I reviewed and are summarized referring note, she had a history of chronic diastolic heart failure, hyperglycemia, but not taking any medications for diabetes, COPD, hypothyroidism, vitamin D deficiency, psoriatic arthritis, left breast cancer, status post lobectomy in 2012 followed by radiation therapy, no chemotherapy is required.  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 Mother died of breast cancer at age 72, her father died at age 67, did suffer severe Alzheimer's disease.   Around 2016, she noticed she has tendency to forget peoples name, misplaced things, got confused while driving in a familiar route, She is still very active at home, does yard work, used to Programmer, multimedia, in 2016, normal CBC creatinine of 0.69, potassium of 3.8, normal CMP, A1c 5.9, total cholesterol 163, LDL 88, normal TSH 0.57, vitamin D 49.9, next  Echocardiogram December 2016, left ventricular cavity is normal, moderate concentric hypertrophy of the left ventricle, normal global Lanni motion, ejection fraction 16%, grade 1 diastolic dysfunction.  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  She continue struggle with short-term memory loss, Mini-Mental Status is 27/30, missed 3 out of 3 recall,  We have personally reviewed MRI of the brain in April 2018: Generalized atrophy, mild supratentorium small vessel disease  UPDATE May 02 2017:YY  I have reviewed and summarized note from her endocrinologist Dr. 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, 73 year old female returns for follow-up with history of mild cognitive impairment.  She continues to cook she is independent in all activities of daily living.  She continues to do the finances.  She continues to drive a car without getting lost.   She is currently on Aricept and Namenda tolerating without side effects she needs refills.  She returns for reevaluation UPDATE 12/10/2019CM [name], 73 year old female returns for follow-up with a history of mild cognitive impairment.  She continues to do the finances.  She continues to be independent in all activities of daily living.  She continues to drive a car without getting lost she continues to cook.  No safety issues identified.  She remains on Namenda and Aricept without side effects.  She returns for reevaluation  Update November 14 2018 SS: Last MMSE 26/04 June 2018, today 24/30 MMSE. She denies any  changes in her memory, her husband reports more trouble with short-term memory.  She lives with her husband, drives a car, does her ADLs, cooking and housework.   She manages her medications.  She enjoys spending time with her sister, doing yard work, playing games on the computer.  She continues to manage her finances without problem.  There have not been any issues with driving.  She and her husband are together 95% of the time.    REVIEW OF SYSTEMS: Out of a complete 14 system review of symptoms, the patient complains only of the following symptoms, and all other reviewed systems are negative.  Memory loss  ALLERGIES: No Known Allergies  HOME MEDICATIONS: Outpatient Medications Prior to Visit  Medication Sig Dispense Refill  . cholecalciferol (VITAMIN D) 1000 units tablet Take 1,000 Units by mouth daily.    Marland Kitchen donepezil (ARICEPT) 10 MG tablet Take 1 tablet (10 mg total) by mouth at bedtime. 30 tablet 11  . ketoconazole (NIZORAL) 2 % cream Apply 1 application topically daily. 15 g 0  . losartan (COZAAR) 100 MG tablet Take 100 mg by mouth daily.      . memantine (NAMENDA) 10 MG tablet Take 1 tablet (10 mg total) by mouth 2 (two) times daily. 60 tablet 11   No facility-administered medications prior to visit.     PAST MEDICAL HISTORY: Past Medical History:  Diagnosis Date  . 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 Difficulty sleeping    since dx of cancer  . Hypertension 05/11/2011  . Memory loss     PAST SURGICAL HISTORY: Past Surgical History:  Procedure Laterality Date  . BREAST LUMPECTOMY  05/16/2011   Procedure: LUMPECTOMY;  Surgeon: Shann Medal, MD;  Location: [place];  Service: General;  Laterality: Left;  Needle localization Left breast lumpectomy  . [place]  . NEVUS EXCISION  05/16/2011   Procedure: NEVUS EXCISION;  Surgeon: Shann Medal, MD;  Location: [place];  Service: General;  Laterality: Left;  remove mole left breast    FAMILY HISTORY: Family History  Problem Relation Age of Onset  . Cancer Mother 33       breast ca  . Alzheimer's disease Father    . Cancer Maternal Aunt        breast  . Cancer Maternal Uncle        breast  . Cancer Cousin        breast    SOCIAL HISTORY: Social History   Socioeconomic History  . Marital status: Married    Spouse name: Not on file  . Number of children: 1  . Years of education: HS  . Highest education level: Not on file  Occupational History  . Occupation: Retired  Scientific laboratory technician  . Financial resource strain: Not on file  . Food insecurity:    Worry: Not on file    Inability: Not on file  . Transportation needs:    Medical: Not on file    Non-medical: Not on file  Tobacco Use  . Smoking status: Former Smoker    Packs/day: 1.00    Years: 46.00    Pack years: 46.00    Types: Cigarettes    Last attempt to quit: 11/04/2009    Years since quitting: 9.0  . Smokeless tobacco: Never Used  Substance and Sexual Activity  . Alcohol use: No  . Drug use: No  . Sexual activity: Yes  Lifestyle  . Physical activity:    Days per week: Not on file    Minutes per session: Not on file  . Stress: Not on file  Relationships  . Social connections:    Talks on phone: Not on file    Gets together: Not on file    Attends religious service: Not on file    Active member of club or organization: Not on file    Attends meetings of clubs or organizations: Not on file    Relationship status: Not on file  . Intimate partner violence:    Fear of current or ex partner: Not on file    Emotionally abused: Not on file    Physically abused: Not on file    Forced sexual activity: Not on file  Other Topics Concern  . Not on file  Social History Narrative   Lives at home with husband.   Right-handed.   No caffeine use.      PHYSICAL EXAM  There were no vitals filed for this visit. There is no height or weight on file to calculate BMI.  Generalized: Well developed, in no acute distress   Neurological examination  Mentation: Alert oriented to time, place, history taking. Follows all commands  speech and language fluent Cranial nerve II-XII: Pupils were equal round reactive to light. Extraocular movements were full, visual field were full on confrontational test. Facial sensation and strength were normal. Uvula tongue midline. Head turning and shoulder shrug  were normal and symmetric. Motor: The motor testing reveals 5 over 5 strength of all 4 extremities. Good symmetric motor tone is noted throughout.  Sensory: Sensory testing is intact to soft touch on all 4 extremities. No evidence of extinction is noted.  Coordination: Cerebellar testing reveals good finger-nose-finger and heel-to-shin bilaterally.  Gait and station: Gait is normal. Tandem gait is normal.   Reflexes: Deep tendon reflexes are symmetric and normal bilaterally.   MMSE - Mini Mental State Exam 11/14/2018 05/15/2018 10/31/2017  Not completed: - (No Data) -  Orientation to time 5 4 5   Orientation to Place 5 5 5   Registration 3 3 3   Attention/ Calculation 2 5 4   Recall 1 0 1  Language- name 2 objects 2 2 2   Language- repeat 1 1 1   Language- follow 3 step command 2 3 3   Language- read & follow direction 1 1 1   Write a sentence 1 1 1   Copy design 1 1 1   Total score 24 26 27      DIAGNOSTIC DATA (LABS, IMAGING, TESTING) - I reviewed patient records, labs, notes, testing and imaging myself where available.  Lab Results  Component Value Date   WBC 7.1 09/06/2016   HGB 13.6 09/06/2016   HCT 41.3 09/06/2016   MCV 88 09/06/2016   PLT 170 09/06/2016      Component Value Date/Time   NA 144 09/06/2016 1109   NA 144 02/25/2014 0931   K 3.8 09/06/2016 1109   K 3.7 02/25/2014 0931   CL 105 09/06/2016 1109   CL 109 (H) 07/30/2012 0824   CO2 24 09/06/2016 1109   CO2 25 02/25/2014 0931   GLUCOSE 94 09/06/2016 1109   GLUCOSE 113 (H) 09/18/2015 2329   GLUCOSE 105 02/25/2014 0931   GLUCOSE 100 (H) 07/30/2012 0824   BUN 19 09/06/2016 1109   BUN 13.7 02/25/2014 0931   CREATININE 0.73 09/06/2016 1109   CREATININE  0.8 02/25/2014 0931   CALCIUM 9.2 09/06/2016 1109   CALCIUM  8.7 02/25/2014 0931   PROT 6.9 09/06/2016 1109   PROT 6.6 02/25/2014 0931   ALBUMIN 3.7 09/06/2016 1109   ALBUMIN 3.1 (L) 02/25/2014 0931   AST 24 09/06/2016 1109   AST 20 02/25/2014 0931   ALT 20 09/06/2016 1109   ALT 13 02/25/2014 0931   ALKPHOS 69 09/06/2016 1109   ALKPHOS 64 02/25/2014 0931   BILITOT 0.2 09/06/2016 1109   BILITOT 0.36 02/25/2014 0931   GFRNONAA 85 09/06/2016 1109   GFRAA 98 09/06/2016 1109   No results found for: CHOL, HDL, LDLCALC, LDLDIRECT, TRIG, CHOLHDL Lab Results  Component Value Date   HGBA1C 5.4 09/06/2016   Lab Results  Component Value Date   JOACZYSA63 016 09/06/2016   Lab Results  Component Value Date   TSH 1.200 12/27/2016      ASSESSMENT AND PLAN 73 y.o. year old female  has a past medical history of Breast cancer ([place]) (04/2011), Difficulty sleeping, Hypertension (05/11/2011), and Memory loss. here with:  1.  Memory disturbance  There was a slight decline in MMSE to 24/30.  She will continue Aricept and Namenda.  I provided refills.  I have encouraged her to incorporate exercise, healthy eating, drinking plenty of water, and engaging in brain stimulating activities to promote memory.  She will follow-up in 6 months or sooner if needed.  I advised her that if her symptoms worsen or she develops any new symptoms she should let us know.   I spent 15 minutes with the patient. 50% of this time was spent discussing her plan of care.   Butler Denmark, AGNP-C, DNP 11/14/2018, 11:47 AM [facility] [address] [place], [place] [address] [phone]

## 2018-11-14 NOTE — Progress Notes (Addendum)
I have reviewed and agreed above plan.  Addendum, colonoscopy report office 04/25/2020, multiple erosion in the sigmoid colon and in the descending colon biopsied, lipoma in proximal ascending colon, small internal hemorrhoid

## 2019-02-08 ENCOUNTER — Other Ambulatory Visit: Payer: Self-pay

## 2019-02-08 DIAGNOSIS — D0512 Intraductal carcinoma in situ of left breast: Secondary | ICD-10-CM

## 2019-02-12 ENCOUNTER — Other Ambulatory Visit: Payer: Self-pay

## 2019-02-12 ENCOUNTER — Inpatient Hospital Stay (HOSPITAL_BASED_OUTPATIENT_CLINIC_OR_DEPARTMENT_OTHER): Payer: Medicare Other | Admitting: Adult Health

## 2019-02-12 ENCOUNTER — Encounter: Payer: Self-pay | Admitting: Adult Health

## 2019-02-12 ENCOUNTER — Inpatient Hospital Stay: Payer: Medicare Other | Attending: Adult Health

## 2019-02-12 VITALS — BP 168/75 | HR 78 | Temp 98.0°F | Resp 18 | Wt 243.2 lb

## 2019-02-12 DIAGNOSIS — Z803 Family history of malignant neoplasm of breast: Secondary | ICD-10-CM | POA: Diagnosis not present

## 2019-02-12 DIAGNOSIS — Z820 Family history of epilepsy and other diseases of the nervous system: Secondary | ICD-10-CM | POA: Diagnosis not present

## 2019-02-12 DIAGNOSIS — Z170 Estrogen receptor positive status [ER+]: Secondary | ICD-10-CM | POA: Diagnosis not present

## 2019-02-12 DIAGNOSIS — D0512 Intraductal carcinoma in situ of left breast: Secondary | ICD-10-CM

## 2019-02-12 DIAGNOSIS — Z87891 Personal history of nicotine dependence: Secondary | ICD-10-CM | POA: Diagnosis not present

## 2019-02-12 DIAGNOSIS — Z79899 Other long term (current) drug therapy: Secondary | ICD-10-CM | POA: Insufficient documentation

## 2019-02-12 LAB — CBC WITH DIFFERENTIAL (CANCER CENTER ONLY)
Abs Immature Granulocytes: 0.03 10*3/uL (ref 0.00–0.07)
Basophils Absolute: 0.1 10*3/uL (ref 0.0–0.1)
Basophils Relative: 1 %
Eosinophils Absolute: 0.2 10*3/uL (ref 0.0–0.5)
Eosinophils Relative: 3 %
HCT: 39.8 % (ref 36.0–46.0)
Hemoglobin: 13.1 g/dL (ref 12.0–15.0)
Immature Granulocytes: 0 %
Lymphocytes Relative: 35 %
Lymphs Abs: 3 10*3/uL (ref 0.7–4.0)
MCH: 28.2 pg (ref 26.0–34.0)
MCHC: 32.9 g/dL (ref 30.0–36.0)
MCV: 85.6 fL (ref 80.0–100.0)
Monocytes Absolute: 0.8 10*3/uL (ref 0.1–1.0)
Monocytes Relative: 9 %
Neutro Abs: 4.6 10*3/uL (ref 1.7–7.7)
Neutrophils Relative %: 52 %
Platelet Count: 197 10*3/uL (ref 150–400)
RBC: 4.65 MIL/uL (ref 3.87–5.11)
RDW: 16.2 % — ABNORMAL HIGH (ref 11.5–15.5)
WBC Count: 8.7 10*3/uL (ref 4.0–10.5)
nRBC: 0 % (ref 0.0–0.2)

## 2019-02-12 LAB — CMP (CANCER CENTER ONLY)
ALT: 12 U/L (ref 0–44)
AST: 19 U/L (ref 15–41)
Albumin: 3.6 g/dL (ref 3.5–5.0)
Alkaline Phosphatase: 79 U/L (ref 38–126)
Anion gap: 10 (ref 5–15)
BUN: 12 mg/dL (ref 8–23)
CO2: 25 mmol/L (ref 22–32)
Calcium: 9.3 mg/dL (ref 8.9–10.3)
Chloride: 109 mmol/L (ref 98–111)
Creatinine: 0.86 mg/dL (ref 0.44–1.00)
GFR, Est AFR Am: >60 mL/min (ref >60)
GFR, Estimated: >60 mL/min (ref >60)
Glucose, Bld: 103 mg/dL — ABNORMAL HIGH (ref 70–99)
Potassium: 3.7 mmol/L (ref 3.5–5.1)
Sodium: 144 mmol/L (ref 135–145)
Total Bilirubin: 0.6 mg/dL (ref 0.3–1.2)
Total Protein: 7.1 g/dL (ref 6.5–8.1)

## 2019-02-12 NOTE — Progress Notes (Signed)
CLINIC:  Survivorship   REASON FOR VISIT:  Routine follow-up for history of breast cancer.   BRIEF ONCOLOGIC HISTORY:   1.  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  Adjuvant radiation therapy by Dr. Valere Dross  3.  Genetic testing negative for BRCA1 and 2   4.  Tamoxifen 20 mg started 09/27/2011-09/2017  INTERVAL HISTORY:  Maria Bolton presents to the [facility] today for routine follow-up for her history of breast cancer.   She has had a good year.  She is up to date with her cancer screenings and sees her PCP regularly.  She has been having issues with her knee, and is working up to walking again.  Her mammogram was done in 08/2018 and was negative and breast density was category B.      REVIEW OF SYSTEMS:  Review of Systems  Constitutional: Negative for appetite change, chills, fatigue, fever and unexpected weight change.  HENT:   Negative for hearing loss, lump/mass and sore throat.   Eyes: Negative for eye problems and icterus.  Respiratory: Negative for chest tightness, cough and shortness of breath.   Cardiovascular: Negative for chest pain, leg swelling and palpitations.  Gastrointestinal: Negative for abdominal distention, abdominal pain, constipation, diarrhea, nausea and vomiting.  Endocrine: Negative for hot flashes.  Genitourinary: Negative for difficulty urinating.   Musculoskeletal: Negative for arthralgias.  Skin: Negative for itching and rash.  Neurological: Negative for dizziness, extremity weakness, headaches and numbness.  Hematological: Negative for adenopathy. Does not bruise/bleed easily.  Psychiatric/Behavioral: Negative for depression. The patient is not nervous/anxious.   Breast: Denies any new nodularity, masses, tenderness, nipple changes, or nipple discharge.       PAST MEDICAL/SURGICAL HISTORY:  Past Medical History:  Diagnosis Date  . Breast cancer ([place]) 04/2011   DCIS left breast , ER/PR+  . Difficulty sleeping    since dx of cancer  . Hypertension 05/11/2011  . Memory loss    Past Surgical History:  Procedure Laterality Date  . BREAST LUMPECTOMY  05/16/2011   Procedure: LUMPECTOMY;  Surgeon: Shann Medal, MD;  Location: [place];  Service: General;  Laterality: Left;  Needle localization Left breast lumpectomy  . [place]  . NEVUS EXCISION  05/16/2011   Procedure: NEVUS EXCISION;  Surgeon: Shann Medal, MD;  Location: [place];  Service: General;  Laterality: Left;  remove mole left breast     ALLERGIES:  No Known Allergies   CURRENT MEDICATIONS:  Outpatient Encounter Medications as of 02/12/2019  Medication Sig  . cholecalciferol (VITAMIN D) 1000 units tablet Take 1,000 Units by mouth daily.  Marland Kitchen donepezil (ARICEPT) 10 MG tablet Take 1 tablet (10 mg total) by mouth at bedtime.  Marland Kitchen ketoconazole (NIZORAL) 2 % cream Apply 1 application topically daily.  Marland Kitchen losartan (COZAAR) 100 MG tablet Take 100 mg by mouth daily.    . memantine (NAMENDA) 10 MG tablet Take 1 tablet (10 mg total) by mouth 2 (two) times daily.   No facility-administered encounter medications on file as of 02/12/2019.      ONCOLOGIC FAMILY HISTORY:  Family History  Problem Relation Age of Onset  . Cancer Mother 72       breast ca  . Alzheimer's disease Father   . Cancer Maternal Aunt        breast  . Cancer Maternal Uncle  breast  . Cancer Cousin        breast      SOCIAL HISTORY:  Social History   Socioeconomic History  . Marital status: Married    Spouse name: Not on file  . Number of children: 1  . Years of education: HS  . Highest education level: Not on file  Occupational History  . Occupation: Retired  Scientific laboratory technician  . Financial resource strain: Not on file  . Food insecurity    Worry: Not on file    Inability: Not on file  . Transportation needs    Medical: Not on file    Non-medical: Not on file   Tobacco Use  . Smoking status: Former Smoker    Packs/day: 1.00    Years: 46.00    Pack years: 46.00    Types: Cigarettes    Quit date: 11/04/2009    Years since quitting: 9.2  . Smokeless tobacco: Never Used  Substance and Sexual Activity  . Alcohol use: No  . Drug use: No  . Sexual activity: Yes  Lifestyle  . Physical activity    Days per week: Not on file    Minutes per session: Not on file  . Stress: Not on file  Relationships  . Social Herbalist on phone: Not on file    Gets together: Not on file    Attends religious service: Not on file    Active member of club or organization: Not on file    Attends meetings of clubs or organizations: Not on file    Relationship status: Not on file  . Intimate partner violence    Fear of current or ex partner: Not on file    Emotionally abused: Not on file    Physically abused: Not on file    Forced sexual activity: Not on file  Other Topics Concern  . Not on file  Social History Narrative   Lives at home with husband.   Right-handed.   No caffeine use.        PHYSICAL EXAMINATION:  Vital Signs: Vitals:   02/12/19 1519  BP: (!) 168/75  Pulse: 78  Resp: 18  Temp: 98 F (36.7 C)  SpO2: 100%   Filed Weights   02/12/19 1519  Weight: 243 lb 4 oz (110.3 kg)   General: Well-nourished, well-appearing female in no acute distress.  Unaccompanied today.   HEENT: Head is normocephalic.  Pupils equal and reactive to light. Conjunctivae clear without exudate.  Sclerae anicteric. Oral mucosa is pink, moist.  Oropharynx is pink without lesions or erythema.  Lymph: No cervical, supraclavicular, or infraclavicular lymphadenopathy noted on palpation.  Cardiovascular: Regular rate and rhythm.Marland Kitchen Respiratory: Clear to auscultation bilaterally. Chest expansion symmetric; breathing non-labored.  Breast Exam:  -Left breast: No appreciable masses on palpation. No skin redness, thickening, or peau d'orange appearance; no nipple  retraction or nipple discharge; mild distortion in symmetry at previous lumpectomy site well healed scar without erythema or nodularity.  -Right breast: No appreciable masses on palpation. No skin redness, thickening, or peau d'orange appearance; no nipple retraction or nipple discharge; -Axilla: No axillary adenopathy bilaterally.  GI: Abdomen soft and round; non-tender, non-distended. Bowel sounds normoactive. No hepatosplenomegaly.   GU: Deferred.  Neuro: No focal deficits. Steady gait.  Psych: Mood and affect normal and appropriate for situation.  MSK: No focal spinal tenderness to palpation, full range of motion in bilateral upper extremities Extremities: No edema. Skin: Warm and dry.  LABORATORY  DATA:  None for this visit   DIAGNOSTIC IMAGING:  Most recent mammogram: normal 08/10/2018    ASSESSMENT AND PLAN:  Maria Bolton is a pleasant 73 y.o. female with history of Stage 0 left breast DCIS, ER+, diagnosed in 04/2011, treated with lumpectomy, adjuvant radiation therapy, and anti-estrogen therapy with Tamoxifen therapy x 5 years completed in 09/2016.  She [facility] for surveillance and routine follow-up.   1. History of breast cancer:  Maria Bolton is currently clinically and radiographically without evidence of disease or recurrence of breast cancer. She will be due for mammogram in 08/2019.  I offered her graduation from   She will return in one year for LTS follow up.  I encouraged her to call me with any questions or concerns before her next visit at the cancer center, and I would be happy to see her sooner, if needed.    2. Bone health:  Given Maria Bolton's age, history of breast cancer, she is at risk for bone demineralization.  She was given education on specific food and activities to promote bone health.  3. Cancer screening:  Due to Maria Bolton history and her age, she should receive screening for skin cancers, colon cancer, lung cancer, and gynecologic cancers.  She was encouraged to follow-up with her PCP for appropriate cancer screenings.   4. Health maintenance and wellness promotion: Maria Bolton was encouraged to consume 5-7 servings of fruits and vegetables per day. She was also encouraged to engage in moderate to vigorous exercise for 30 minutes per day most days of the week. She was instructed to limit her alcohol consumption and continue to abstain from tobacco use.    Dispo:  -Return to cancer center in one year for LTS follow up -Mammogram due in 08/2019   A total of (30) minutes of face-to-face time was spent with this patient with greater than 50% of that time in counseling and care-coordination.   Gardenia Phlegm, NP Survivorship Program [place] [phone]   Note: PRIMARY CARE PROVIDER No primary care provider on file. None None

## 2019-02-13 ENCOUNTER — Telehealth: Payer: Self-pay | Admitting: Adult Health

## 2019-02-13 NOTE — Telephone Encounter (Signed)
I left a message regarding schedule  

## 2019-04-02 DIAGNOSIS — Z9189 Other specified personal risk factors, not elsewhere classified: Secondary | ICD-10-CM | POA: Diagnosis not present

## 2019-04-02 DIAGNOSIS — E559 Vitamin D deficiency, unspecified: Secondary | ICD-10-CM | POA: Diagnosis not present

## 2019-04-02 DIAGNOSIS — I129 Hypertensive chronic kidney disease with stage 1 through stage 4 chronic kidney disease, or unspecified chronic kidney disease: Secondary | ICD-10-CM | POA: Diagnosis not present

## 2019-04-02 DIAGNOSIS — Z780 Asymptomatic menopausal state: Secondary | ICD-10-CM | POA: Diagnosis not present

## 2019-04-10 DIAGNOSIS — E559 Vitamin D deficiency, unspecified: Secondary | ICD-10-CM | POA: Diagnosis not present

## 2019-04-10 DIAGNOSIS — I129 Hypertensive chronic kidney disease with stage 1 through stage 4 chronic kidney disease, or unspecified chronic kidney disease: Secondary | ICD-10-CM | POA: Diagnosis not present

## 2019-04-10 DIAGNOSIS — N390 Urinary tract infection, site not specified: Secondary | ICD-10-CM | POA: Diagnosis not present

## 2019-04-10 DIAGNOSIS — E0590 Thyrotoxicosis, unspecified without thyrotoxic crisis or storm: Secondary | ICD-10-CM | POA: Diagnosis not present

## 2019-04-19 DIAGNOSIS — Z0000 Encounter for general adult medical examination without abnormal findings: Secondary | ICD-10-CM | POA: Diagnosis not present

## 2019-04-19 DIAGNOSIS — I10 Essential (primary) hypertension: Secondary | ICD-10-CM | POA: Diagnosis not present

## 2019-04-19 DIAGNOSIS — L4050 Arthropathic psoriasis, unspecified: Secondary | ICD-10-CM | POA: Diagnosis not present

## 2019-04-19 DIAGNOSIS — J449 Chronic obstructive pulmonary disease, unspecified: Secondary | ICD-10-CM | POA: Diagnosis not present

## 2019-04-20 ENCOUNTER — Other Ambulatory Visit: Payer: Self-pay

## 2019-04-20 ENCOUNTER — Emergency Department (HOSPITAL_COMMUNITY)
Admission: EM | Admit: 2019-04-20 | Discharge: 2019-04-21 | Disposition: A | Discharge status: Home / Self Care | Payer: Medicare Other | Attending: Emergency Medicine | Admitting: Emergency Medicine

## 2019-04-20 DIAGNOSIS — Z79899 Other long term (current) drug therapy: Secondary | ICD-10-CM | POA: Insufficient documentation

## 2019-04-20 DIAGNOSIS — Z87891 Personal history of nicotine dependence: Secondary | ICD-10-CM | POA: Diagnosis not present

## 2019-04-20 DIAGNOSIS — Z853 Personal history of malignant neoplasm of breast: Secondary | ICD-10-CM | POA: Insufficient documentation

## 2019-04-20 DIAGNOSIS — M436 Torticollis: Secondary | ICD-10-CM

## 2019-04-20 DIAGNOSIS — Z743 Need for continuous supervision: Secondary | ICD-10-CM | POA: Diagnosis not present

## 2019-04-20 DIAGNOSIS — M542 Cervicalgia: Secondary | ICD-10-CM | POA: Diagnosis not present

## 2019-04-20 DIAGNOSIS — I10 Essential (primary) hypertension: Secondary | ICD-10-CM | POA: Diagnosis not present

## 2019-04-20 NOTE — ED Provider Notes (Signed)
[facility] DEPT Provider Note   CSN: [id number] Arrival date & time: 04/20/19  2252     History   Chief Complaint Chief Complaint  Patient presents with  . right sholder/neck pain    HPI Maria Bolton is a 73 y.o. female.     Patient presents to the emergency department for evaluation of right-sided neck pain.  Patient reports that pain began 2 days ago.  She denies any injury.  She reports that the pain has progressively worsened since she first felt it, now severe.  Pain is entirely on the right side of the neck and into the base of the neck/shoulder area.  It does not radiate down the arm.  No numbness, tingling, weakness of upper extremities.     Past Medical History:  Diagnosis Date  . Breast cancer ([facility]) 04/2011   DCIS left breast , ER/PR+  . Difficulty sleeping    since dx of cancer  . Hypertension 05/11/2011  . Memory loss     Patient Active Problem List   Diagnosis Date Noted  . Abnormal thyroid blood test 12/27/2016  . Abnormal glucose 09/06/2016  . Mild cognitive impairment 09/06/2016  . Fever 09/19/2015  . Acute bronchitis 09/19/2015  . Anxiety about health 09/19/2015  . 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 Hypertension 05/11/2011    Past Surgical History:  Procedure Laterality Date  . BREAST LUMPECTOMY  05/16/2011   Procedure: LUMPECTOMY;  Surgeon: Shann Medal, MD;  Location: [facility];  Service: General;  Laterality: Left;  Needle localization Left breast lumpectomy  . [place]  . NEVUS EXCISION  05/16/2011   Procedure: NEVUS EXCISION;  Surgeon: Shann Medal, MD;  Location: [facility];  Service: General;  Laterality: Left;  remove mole left breast     OB History   No obstetric history on file.      Home Medications    Prior to Admission medications   Medication Sig Start Date End Date Taking? Authorizing Provider  cholecalciferol (VITAMIN D)  1000 units tablet Take 1,000 Units by mouth daily.   Yes [provider]  donepezil (ARICEPT) 10 MG tablet Take 1 tablet (10 mg total) by mouth at bedtime. 11/14/18  Yes Suzzanne Cloud, NP  ketoconazole (NIZORAL) 2 % cream Apply 1 application topically daily. 09/26/17  Yes Causey, Charlestine Massed, NP  losartan (COZAAR) 100 MG tablet Take 100 mg by mouth daily.     Yes [provider]  memantine (NAMENDA) 10 MG tablet Take 1 tablet (10 mg total) by mouth 2 (two) times daily. 11/14/18  Yes Suzzanne Cloud, NP  HYDROcodone-acetaminophen (NORCO/VICODIN) 5-325 MG tablet Take 1 tablet by mouth every 4 (four) hours as needed for moderate pain. 04/21/19   Orpah Greek, MD  meloxicam (MOBIC) 7.5 MG tablet Take 1 tablet (7.5 mg total) by mouth daily. 04/21/19   Orpah Greek, MD    Family History Family History  Problem Relation Age of Onset  . Cancer Mother 27       breast ca  . Alzheimer's disease Father   . Cancer Maternal Aunt        breast  . Cancer Maternal Uncle        breast  . Cancer Cousin        breast    Social History Social History   Tobacco Use  . Smoking status: Former Smoker  Packs/day: 1.00    Years: 46.00    Pack years: 46.00    Types: Cigarettes    Quit date: 11/04/2009    Years since quitting: 9.4  . Smokeless tobacco: Never Used  Substance Use Topics  . Alcohol use: No  . Drug use: No     Allergies   Patient has no known allergies.   Review of Systems Review of Systems  Musculoskeletal: Positive for neck pain.  All other systems reviewed and are negative.    Physical Exam Updated Vital Signs BP (!) 161/74 (BP Location: Left Arm)   Pulse 69   Temp 98.3 F (36.8 C) (Oral)   Resp 14   Ht 5\' 5"  (1.651 m)   Wt 95.3 kg   SpO2 98%   BMI 34.95 kg/m   Physical Exam Vitals signs and nursing note reviewed.  Constitutional:      General: She is not in acute distress.    Appearance: Normal appearance. She is  well-developed.  HENT:     Head: Normocephalic and atraumatic.     Right Ear: Hearing normal.     Left Ear: Hearing normal.     Nose: Nose normal.  Eyes:     Conjunctiva/sclera: Conjunctivae normal.     Pupils: Pupils are equal, round, and reactive to light.  Neck:     Musculoskeletal: Decreased range of motion. Pain with movement, torticollis and muscular tenderness present. No spinous process tenderness.   Cardiovascular:     Rate and Rhythm: Regular rhythm.     Heart sounds: S1 normal and S2 normal. No murmur. No friction rub. No gallop.   Pulmonary:     Effort: Pulmonary effort is normal. No respiratory distress.     Breath sounds: Normal breath sounds.  Chest:     Chest Arechiga: No tenderness.  Abdominal:     General: Bowel sounds are normal.     Palpations: Abdomen is soft.     Tenderness: There is no abdominal tenderness. There is no guarding or rebound. Negative signs include Murphy's sign and McBurney's sign.     Hernia: No hernia is present.  Skin:    General: Skin is warm and dry.     Findings: No rash.  Neurological:     Mental Status: She is alert and oriented to person, place, and time.     GCS: GCS eye subscore is 4. GCS verbal subscore is 5. GCS motor subscore is 6.     Cranial Nerves: No cranial nerve deficit.     Sensory: No sensory deficit.     Coordination: Coordination normal.     Deep Tendon Reflexes:     Reflex Scores:      Bicep reflexes are 2+ on the right side and 2+ on the left side. Psychiatric:        Speech: Speech normal.        Behavior: Behavior normal.        Thought Content: Thought content normal.      ED Treatments / Results  Labs (all labs ordered are listed, but only abnormal results are displayed) Labs Reviewed - No data to display  EKG None  Radiology Ct Cervical Spine Wo Contrast  Result Date: 04/21/2019 CLINICAL DATA:  Shoulder and neck pain EXAM: CT CERVICAL SPINE WITHOUT CONTRAST TECHNIQUE: Multidetector CT imaging of  the cervical spine was performed without intravenous contrast. Multiplanar CT image reconstructions were also generated. COMPARISON:  None. FINDINGS: Alignment: Normal. Skull base and vertebrae: No acute  fracture. No primary bone lesion or focal pathologic process. Soft tissues and spinal canal: No prevertebral fluid or swelling. No visible canal hematoma. Disc levels: No bony spinal canal stenosis. Uncovertebral hypertrophy contributes to moderate right foraminal stenosis at C6-7. mild bilateral C4 foraminal stenosis. Mild left C5 foraminal stenosis. Multilevel facet hypertrophy. Upper chest: Clear Other: None IMPRESSION: 1. No acute fracture or static subluxation of the cervical spine. 2. Moderate right C6-7 foraminal stenosis secondary to uncovertebral hypertrophy. 3. Mild bilateral C4 and mild left C5 neural foraminal stenosis. Electronically Signed   By: Ulyses Jarred M.D.   On: 04/21/2019 01:24    Procedures Procedures (including critical care time)  Medications Ordered in ED Medications  HYDROmorphone (DILAUDID) injection 0.5 mg (has no administration in time range)  ondansetron (ZOFRAN) injection 4 mg (has no administration in time range)     Initial Impression / Assessment and Plan / ED Course  I have reviewed the triage vital signs and the nursing notes.  Pertinent labs & imaging results that were available during my care of the patient were reviewed by me and considered in my medical decision making (see chart for details).        Patient presents with nontraumatic neck pain.  Pain is on the right side of her neck and is preventing her from turning from side to side.  She does not have any significant radicular component.  She has normal strength, sensation, reflexes of the upper extremity.  CT scan shows chronic changes but no acute pathology.  She has significant tenderness of the right paraspinal muscles with spasm present.  This is consistent with a torticollis.  Final Clinical  Impressions(s) / ED Diagnoses   Final diagnoses:  Torticollis, acute    ED Discharge Orders         Ordered    meloxicam (MOBIC) 7.5 MG tablet  Daily     04/21/19 0225    HYDROcodone-acetaminophen (NORCO/VICODIN) 5-325 MG tablet  Every 4 hours PRN     04/21/19 0225           Orpah Greek, MD 04/21/19 0225

## 2019-04-20 NOTE — ED Triage Notes (Signed)
Per EMS _ Pt coming from home, states shoulder and neck pain started 2 days ago and this evening got significantly worse. Pain upon palpation and upon turning head. No numbness or tingling.   100 mcg Fentanyl  (without relief)  210 100 Now 174 98 100% RA 72 HR  NSR  18 R 97.2   22g lt hand

## 2019-04-21 ENCOUNTER — Emergency Department (HOSPITAL_COMMUNITY): Payer: Medicare Other

## 2019-04-21 DIAGNOSIS — M542 Cervicalgia: Secondary | ICD-10-CM | POA: Diagnosis not present

## 2019-04-21 MED ORDER — ONDANSETRON HCL 4 MG/2ML IJ SOLN
4.0000 mg | Freq: Once | INTRAMUSCULAR | Status: AC
  Administered 2019-04-21: 4 mg via INTRAVENOUS
  Filled 2019-04-21: qty 2

## 2019-04-21 MED ORDER — MELOXICAM 7.5 MG PO TABS: 7.5000 mg | ORAL_TABLET | Freq: Every day | ORAL | 0 refills | Status: DC

## 2019-04-21 MED ORDER — HYDROCODONE-ACETAMINOPHEN 5-325 MG PO TABS: 1.0000 | ORAL_TABLET | ORAL | 0 refills | Status: DC | PRN

## 2019-04-21 MED ORDER — HYDROMORPHONE HCL 1 MG/ML IJ SOLN
0.5000 mg | Freq: Once | INTRAMUSCULAR | Status: AC
  Administered 2019-04-21: 03:00:00 0.5 mg via INTRAVENOUS
  Filled 2019-04-21: qty 1

## 2019-05-20 ENCOUNTER — Other Ambulatory Visit: Payer: Self-pay

## 2019-05-20 ENCOUNTER — Ambulatory Visit: Payer: Medicare Other | Admitting: Neurology

## 2019-05-20 ENCOUNTER — Encounter: Payer: Self-pay | Admitting: Neurology

## 2019-05-20 VITALS — BP 161/78 | HR 73 | Temp 97.4°F | Ht 66.0 in | Wt 241.4 lb

## 2019-05-20 DIAGNOSIS — G3184 Mild cognitive impairment, so stated: Secondary | ICD-10-CM

## 2019-05-20 NOTE — Progress Notes (Signed)
PATIENT: Maria Bolton DOB: 1945-12-09  REASON FOR VISIT: follow up HISTORY FROM: patient  HISTORY OF PRESENT ILLNESS: Today 05/20/19  HISTORY  HISTORY  Maria Bolton a 73 years old right-handed female, accompanied by her husband, seen in refer by her cardiologist Dr. Adrian Prows for evaluation of memory loss, her primary care physician is Dr. Teena Irani, initial evaluation was on September 06 2016.  I reviewed and are summarized referring note, she had a history of chronic diastolic heart failure, hyperglycemia, but not taking any medications for diabetes, COPD, hypothyroidism, vitamin D deficiency, psoriatic arthritis, left breast cancer, status post lobectomy in 2012 followed by radiation therapy, no chemotherapy is required.  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 Mother died of breast cancer at age 25, her father died at age 48, did suffer severe Alzheimer's disease.   Around 2016, she noticed she has tendency to forget peoples name, misplaced things, got confused while driving in a familiar route, She is still very active at home, does yard work, used to Program[employer], multimedia, in 2016, normal CBC creatinine of 0.69, potassium of 3.8, normal CMP, A1c 5.9, total cholesterol 163, LDL 88, normal TSH 0.57, vitamin D 49.9, next  Echocardiogram December 2016, left ventricular cavity is normal, moderate concentric hypertrophy of the left ventricle, normal global Gruenwald motion, ejection fraction [id number], grade 1 diastolic dysfunction.  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  She continue struggle with short-term memory loss, Mini-Mental Status is 27/30, missed 3 out of 3 recall,  We have personally reviewed MRI of the brain in April 2018: Generalized atrophy, mild supratentorium small vessel disease  UPDATE May 02 2017:YY  I have reviewed and summarized note from her endocrinologist Dr. 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, 72 year old female returns for follow-up with history of mild cognitive impairment. She continues to cook she is independent in all activities of daily living. She continues to do the finances. She continues to drive a car without getting lost. She is currently on Aricept and Namenda tolerating without side effects she needs refills. She returns for reevaluation UPDATE12/10/2019[name], 73 year old female returns for follow-up with a history of mild cognitive impairment. She continues to do the finances. She continues to be independent in all activities of daily living. She continues to drive a car without getting lost she continues to cook. No safety issues identified.She remains on Namenda and Aricept without side effects. She returns for reevaluation  Update November 14 2018 SS: Last MMSE 26/04 June 2018, today 24/30 MMSE. She denies any changes in her memory, her husband reports more trouble with short-term memory.  She lives with her husband, drives a car, does her ADLs, cooking and  housework.  She manages her medications.  She enjoys spending time with her sister, doing yard work, playing games on the computer.  She continues to manage her finances without problem.  There have not been any issues with driving.  She and her husband are together 95% of the time.  Update May 20, 2019 SS: MMSE today 21/30.  She feels overall her memory is stable, her husband reports continued loss of short-term memory.  She has difficulty remembering appointments, when they eat at restaurants, forgets they have been there before.  She manages their finances, medications, housework, ADLs, she does all this well. She drives a car without difficulty, goes to the grocery store and drugstore.  She remains on Aricept and Namenda.  She has not had any falls. She was in the ER for neck pain. She had a physical with Dr. Shelia Media this year.  She presents today for evaluation accompanied by her husband.  REVIEW OF SYSTEMS: Out of a complete 14 system review of symptoms, the patient complains only of the following symptoms, and all other reviewed systems are negative.  Memory loss  ALLERGIES: No Known Allergies  HOME MEDICATIONS: Outpatient Medications Prior to Visit  Medication Sig Dispense Refill  . cholecalciferol (VITAMIN D) 1000 units tablet Take 1,000 Units by mouth daily.    Marland Kitchen donepezil (ARICEPT) 10 MG tablet Take 1 tablet (10 mg total) by mouth at bedtime. 30 tablet 11  . losartan (COZAAR) 100 MG tablet Take 100 mg by mouth daily.      . meloxicam (MOBIC) 7.5 MG tablet Take 1 tablet (7.5 mg total) by mouth daily. 10 tablet 0  . memantine (NAMENDA) 10 MG tablet Take 1 tablet (10 mg total) by mouth 2 (two) times daily. 60 tablet 11  . rosuvastatin (CRESTOR) 10 MG tablet Take 10 mg by mouth daily.    Marland Kitchen HYDROcodone-acetaminophen (NORCO/VICODIN) 5-325 MG tablet Take 1 tablet by mouth every 4 (four) hours as needed for moderate pain. 10 tablet 0  . ketoconazole (NIZORAL) 2 % cream Apply 1 application  topically daily. 15 g 0   No facility-administered medications prior to visit.    PAST MEDICAL HISTORY: Past Medical History:  Diagnosis Date  . 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 Difficulty sleeping    since dx of cancer  . Hypertension 05/11/2011  . Memory loss     PAST SURGICAL HISTORY: Past Surgical History:  Procedure Laterality Date  . BREAST LUMPECTOMY  05/16/2011   Procedure: LUMPECTOMY;  Surgeon: Shann Medal, MD;  Location: [facility];  Service: General;  Laterality: Left;  Needle localization Left breast lumpectomy  . Why  . NEVUS EXCISION  05/16/2011   Procedure: NEVUS EXCISION;  Surgeon: Shann Medal, MD;  Location: [facility];  Service: General;  Laterality: Left;  remove mole left breast    FAMILY HISTORY: Family History  Problem Relation Age of Onset  . Cancer Mother 93       breast ca  . Alzheimer's disease Father   . Cancer Maternal Aunt        breast  . Cancer Maternal Uncle        breast  . Cancer Cousin        breast    SOCIAL HISTORY: Social History   Socioeconomic History  . Marital status: Married  Spouse name: Not on file  . Number of children: 1  . Years of education: HS  . Highest education level: Not on file  Occupational History  . Occupation: Retired  Tobacco Use  . Smoking status: Former Smoker    Packs/day: 1.00    Years: 46.00    Pack years: 46.00    Types: Cigarettes    Quit date: 11/04/2009    Years since quitting: 9.5  . Smokeless tobacco: Never Used  Substance and Sexual Activity  . Alcohol use: No  . Drug use: No  . Sexual activity: Yes  Other Topics Concern  . Not on file  Social History Narrative   Lives at home with husband.   Right-handed.   No caffeine use.   Social Determinants of Health   Financial Resource Strain:   . Difficulty of Paying Living Expenses: Not on file  Food Insecurity:   . Worried About Charity fundraiser in  the Last Year: Not on file  . Ran Out of Food in the Last Year: Not on file  Transportation Needs:   . Lack of Transportation (Medical): Not on file  . Lack of Transportation (Non-Medical): Not on file  Physical Activity:   . Days of Exercise per Week: Not on file  . Minutes of Exercise per Session: Not on file  Stress:   . Feeling of Stress : Not on file  Social Connections:   . Frequency of Communication with Friends and Family: Not on file  . Frequency of Social Gatherings with Friends and Family: Not on file  . Attends Religious Services: Not on file  . Active Member of Clubs or Organizations: Not on file  . Attends Archivist Meetings: Not on file  . Marital Status: Not on file  Intimate Partner Violence:   . Fear of Current or Ex-Partner: Not on file  . Emotionally Abused: Not on file  . Physically Abused: Not on file  . Sexually Abused: Not on file   PHYSICAL EXAM  Vitals:   05/20/19 1312  BP: (!) 161/78  Pulse: 73  Temp: (!) 97.4 F (36.3 C)  TempSrc: Oral  Weight: 241 lb 6.4 oz (109.5 kg)  Height: 5\' 6"  (1.676 m)   Body mass index is 38.96 kg/m.  Generalized: Well developed, in no acute distress  MMSE - Mini Mental State Exam 05/20/2019 11/14/2018 05/15/2018  Not completed: (No Data) - (No Data)  Orientation to time 4 5 4   Orientation to Place 3 5 5   Registration 3 3 3   Attention/ Calculation 2 2 5   Recall 1 1 0  Language- name 2 objects 2 2 2   Language- repeat 1 1 1   Language- follow 3 step command 2 2 3   Language- follow 3 step command-comments she grabbed it with her left hand - -  Language- read & follow direction 1 1 1   Write a sentence 1 1 1   Copy design 1 1 1   Total score 21 24 26    Neurological examination  Mentation: Alert oriented to time, place, history taking. Follows all commands speech and language fluent Cranial nerve II-XII: Pupils were equal round reactive to light. Extraocular movements were full, visual field were full on  confrontational test. Facial sensation and strength were normal. Head turning and shoulder shrug  were normal and symmetric. Motor: The motor testing reveals 5 over 5 strength of all 4 extremities. Good symmetric motor tone is noted throughout.  Sensory: Sensory testing is intact to soft  touch on all 4 extremities. No evidence of extinction is noted.  Coordination: Cerebellar testing reveals good finger-nose-finger and heel-to-shin bilaterally.  Gait and station: Gait is normal.  Reflexes: Deep tendon reflexes are symmetric and normal bilaterally.   DIAGNOSTIC DATA (LABS, IMAGING, TESTING) - I reviewed patient records, labs, notes, testing and imaging myself where available.  Lab Results  Component Value Date   WBC 8.7 02/12/2019   HGB 13.1 02/12/2019   HCT 39.8 02/12/2019   MCV 85.6 02/12/2019   PLT 197 02/12/2019      Component Value Date/Time   NA 144 02/12/2019 1455   NA 144 09/06/2016 1109   NA 144 02/25/2014 0931   K 3.7 02/12/2019 1455   K 3.7 02/25/2014 0931   CL 109 02/12/2019 1455   CL 109 (H) 07/30/2012 0824   CO2 25 02/12/2019 1455   CO2 25 02/25/2014 0931   GLUCOSE 103 (H) 02/12/2019 1455   GLUCOSE 105 02/25/2014 0931   GLUCOSE 100 (H) 07/30/2012 0824   BUN 12 02/12/2019 1455   BUN 19 09/06/2016 1109   BUN 13.7 02/25/2014 0931   CREATININE 0.86 02/12/2019 1455   CREATININE 0.8 02/25/2014 0931   CALCIUM 9.3 02/12/2019 1455   CALCIUM 8.7 02/25/2014 0931   PROT 7.1 02/12/2019 1455   PROT 6.9 09/06/2016 1109   PROT 6.6 02/25/2014 0931   ALBUMIN 3.6 02/12/2019 1455   ALBUMIN 3.7 09/06/2016 1109   ALBUMIN 3.1 (L) 02/25/2014 0931   AST 19 02/12/2019 1455   AST 20 02/25/2014 0931   ALT 12 02/12/2019 1455   ALT 13 02/25/2014 0931   ALKPHOS 79 02/12/2019 1455   ALKPHOS 64 02/25/2014 0931   BILITOT 0.6 02/12/2019 1455   BILITOT 0.36 02/25/2014 0931   GFRNONAA >60 02/12/2019 1455   GFRAA >60 02/12/2019 1455   No results found for: CHOL, HDL, LDLCALC,  LDLDIRECT, TRIG, CHOLHDL Lab Results  Component Value Date   HGBA1C 5.4 09/06/2016   Lab Results  Component Value Date   [id number] 09/06/2016   Lab Results  Component Value Date   TSH 1.200 12/27/2016   ASSESSMENT AND PLAN 73 y.o. year old female  has a past medical history of Breast cancer ([facility]) (04/2011), Difficulty sleeping, Hypertension (05/11/2011), and Memory loss. here with:  1.  Memory loss  Her memory score showed a slight decline 21/30.  Fortunately, she seems to have remained quite independent and active.  She will remain on Aricept and Namenda.  I have encouraged her to incorporate exercise, healthy eating, drinking plenty of water, and to continue brain stimulating exercises.  She will follow-up in 8 months or sooner if needed.  I did advise if her symptoms worsen or she develops any new symptoms she should let us know.   I spent 15 minutes with the patient. 50% of this time was spent discussing her plan of care.   Butler Denmark, AGNP-C, DNP 05/20/2019, 1:18 PM [facility] [address], [place], [place] [address] [phone]

## 2019-05-20 NOTE — Patient Instructions (Signed)
Continue the Aricept and Namenda  I will see you in 8 months or sooner if needed

## 2019-06-24 ENCOUNTER — Other Ambulatory Visit: Payer: Self-pay

## 2019-06-24 ENCOUNTER — Ambulatory Visit (INDEPENDENT_AMBULATORY_CARE_PROVIDER_SITE_OTHER)
Admission: RE | Admit: 2019-06-24 | Discharge: 2019-06-24 | Disposition: A | Discharge status: Home / Self Care | Payer: Medicare Other | Source: Ambulatory Visit | Attending: Acute Care | Admitting: Acute Care

## 2019-06-24 DIAGNOSIS — Z87891 Personal history of nicotine dependence: Secondary | ICD-10-CM | POA: Diagnosis not present

## 2019-06-24 DIAGNOSIS — Z122 Encounter for screening for malignant neoplasm of respiratory organs: Secondary | ICD-10-CM

## 2019-06-27 ENCOUNTER — Other Ambulatory Visit: Payer: Self-pay | Admitting: *Deleted

## 2019-06-27 DIAGNOSIS — Z87891 Personal history of nicotine dependence: Secondary | ICD-10-CM

## 2019-08-07 NOTE — Progress Notes (Signed)
I have reviewed and agreed above plan. 

## 2019-08-12 DIAGNOSIS — Z1231 Encounter for screening mammogram for malignant neoplasm of breast: Secondary | ICD-10-CM | POA: Diagnosis not present

## 2019-12-17 DIAGNOSIS — Z1211 Encounter for screening for malignant neoplasm of colon: Secondary | ICD-10-CM | POA: Diagnosis not present

## 2019-12-30 ENCOUNTER — Telehealth: Payer: Self-pay | Admitting: Neurology

## 2019-12-30 ENCOUNTER — Other Ambulatory Visit: Payer: Self-pay

## 2019-12-30 MED ORDER — DONEPEZIL HCL 10 MG PO TABS: 10.0000 mg | ORAL_TABLET | Freq: Every day | ORAL | 11 refills | Status: DC

## 2019-12-30 MED ORDER — MEMANTINE HCL 10 MG PO TABS: 10.0000 mg | ORAL_TABLET | Freq: Two times a day (BID) | ORAL | 11 refills | Status: DC

## 2019-12-30 NOTE — Telephone Encounter (Signed)
Pt request refill memantine (NAMENDA) 10 MG tablet and donepezil (ARICEPT) 10 MG tablet at  [id number].

## 2020-01-15 DIAGNOSIS — Z1211 Encounter for screening for malignant neoplasm of colon: Secondary | ICD-10-CM | POA: Diagnosis not present

## 2020-01-15 DIAGNOSIS — K6389 Other specified diseases of intestine: Secondary | ICD-10-CM | POA: Diagnosis not present

## 2020-01-15 DIAGNOSIS — K633 Ulcer of intestine: Secondary | ICD-10-CM | POA: Diagnosis not present

## 2020-01-15 DIAGNOSIS — K529 Noninfective gastroenteritis and colitis, unspecified: Secondary | ICD-10-CM | POA: Diagnosis not present

## 2020-01-20 ENCOUNTER — Ambulatory Visit: Payer: Self-pay | Admitting: Neurology

## 2020-02-03 ENCOUNTER — Other Ambulatory Visit: Payer: Self-pay

## 2020-02-03 ENCOUNTER — Ambulatory Visit: Payer: Medicare Other | Admitting: Neurology

## 2020-02-03 ENCOUNTER — Telehealth: Payer: Self-pay | Admitting: Adult Health

## 2020-02-03 ENCOUNTER — Encounter: Payer: Self-pay | Admitting: Neurology

## 2020-02-03 VITALS — BP 167/86 | HR 75 | Ht 66.0 in | Wt 231.8 lb

## 2020-02-03 DIAGNOSIS — G3184 Mild cognitive impairment, so stated: Secondary | ICD-10-CM | POA: Diagnosis not present

## 2020-02-03 NOTE — Telephone Encounter (Signed)
Rescheduled 9/8 appt per LC's instructions. Pt confirmed new appt date and time.

## 2020-02-03 NOTE — Progress Notes (Signed)
PATIENT: Maria Bolton DOB: Mar 25, 1946  REASON FOR VISIT: follow up HISTORY FROM: patient  HISTORY OF PRESENT ILLNESS: Today 02/03/20  HISTORY Maria Bolton a 74 years old right-handed female, accompanied by her husband, seen in refer by her cardiologist Dr. Adrian Bolton for evaluation of memory loss, her primary care physician is Dr. Teena Bolton, initial evaluation was on September 06 2016.  I reviewed and are summarized referring note, she had a history of chronic diastolic heart failure, hyperglycemia, but not taking any medications for diabetes, COPD, hypothyroidism, vitamin D deficiency, psoriatic arthritis, left breast cancer, status post lobectomy in 2012 followed by radiation therapy, no chemotherapy is required.  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 Mother died of breast cancer at age 16, her father died at age 13, did suffer severe Alzheimer's disease.   Around 2016, she noticed she has tendency to forget peoples name, misplaced things, got confused while driving in a familiar route, She is still very active at home, does yard work, used to Program[employer], multimedia, in 2016, normal CBC creatinine of 0.69, potassium of 3.8, normal CMP, A1c 5.9, total cholesterol 163, LDL 88, normal TSH 0.57, vitamin D 49.9, next  Echocardiogram December 2016, left ventricular cavity is normal, moderate concentric hypertrophy of the left ventricle, normal global Brimage motion, ejection fraction 59%, grade 1 diastolic dysfunction.  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  She continue struggle with short-term memory loss, Mini-Mental Status is 27/30, missed 3 out of 3 recall,  We have personally reviewed MRI of the brain in April 2018: Generalized atrophy, mild supratentorium small vessel disease  UPDATE May 02 2017:YY  I have reviewed and summarized note from her endocrinologist Dr. Chalmers Bolton, Maria Bolton dated November 25 2016, hyperthyroidism, anxiety, thyrotoxicosis without thyroid toxic crisis or storm, she was treated for radioactive iodine on December 01 2016, treatment of toxic multinodular goiter  She is tolerating Aricept 10 mg every day, Namenda 10 mg twice a day,her husbandreported mild worsening memory loss,  I reviewed the laboratory evaluations in October 2018, cholesterol 156, LDL 68, normal TSH, vitamin D was 49, hemoglobin was 13.3, creatinine was 0.72, UPDATE 5/28/2019CM Ms. Bolton, 74 year old female returns for follow-up with history of mild cognitive impairment. She continues to cook she is independent in all activities of daily living. She continues to do the finances. She continues to drive a car without getting lost. She is currently on Aricept and Namenda tolerating without side effects she needs refills. She returns for reevaluation UPDATE12/10/2019CMMs. Bolton, 74 year old female returns for follow-up with a history of mild cognitive impairment. She continues to do the finances. She continues to be independent in all activities of daily living. She continues to drive a car without getting lost she continues to cook. No safety issues identified.She remains on Namenda and Aricept without side effects. She returns for reevaluation  Update November 14 2018 DJ:TTSV MMSE 26/04 June 2018, today 24/30 MMSE. She denies any changes in her memory,her husband reports more trouble with short-term memory. She lives with her husband, drives  a car, does her ADLs, cooking and housework. She  manages her medications. She enjoys spending time with her sister, doing yard work, playing games on the computer. She continues to manage her finances without problem.There have not been any issues with driving. She and her husband are together 95% of the time.  Update May 20, 2019 SS: MMSE today 21/30.  She feels overall her memory is stable, her husband reports continued loss of short-term memory.  She has difficulty remembering appointments, when they eat at restaurants, forgets they have been there before.  She manages their finances, medications, housework, ADLs, she does all this well. She drives a car without difficulty, goes to the grocery store and drugstore.  She remains on Aricept and Namenda.  She has not had any falls. She was in the ER for neck pain. She had a physical with Dr. Shelia Media this year.  She presents today for evaluation accompanied by her husband.  Update February 03, 2020 SS: MMSE 27/30 today.  Remains on Aricept and Namenda, overall doing well, stable.  Continues to remain active, is driving, doing ADLs, managing household, working in the lawn.  No new problems or concerns.  REVIEW OF SYSTEMS: Out of a complete 14 system review of symptoms, the patient complains only of the following symptoms, and all other reviewed systems are negative.  Memory loss  ALLERGIES: No Known Allergies  HOME MEDICATIONS: Outpatient Medications Prior to Visit  Medication Sig Dispense Refill  . cholecalciferol (VITAMIN D) 1000 units tablet Take 1,000 Units by mouth daily.    Marland Kitchen donepezil (ARICEPT) 10 MG tablet Take 1 tablet (10 mg total) by mouth at bedtime. 30 tablet 11  . losartan (COZAAR) 100 MG tablet Take 100 mg by mouth daily.      . memantine (NAMENDA) 10 MG tablet Take 1 tablet (10 mg total) by mouth 2 (two) times daily. 60 tablet 11  . rosuvastatin (CRESTOR) 10 MG tablet Take 10 mg by mouth daily.     No facility-administered medications prior to visit.    PAST MEDICAL  HISTORY: Past Medical History:  Diagnosis Date  . 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 Difficulty sleeping    since dx of cancer  . Hypertension 05/11/2011  . Memory loss     PAST SURGICAL HISTORY: Past Surgical History:  Procedure Laterality Date  . BREAST LUMPECTOMY  05/16/2011   Procedure: LUMPECTOMY;  Surgeon: Shann Medal, MD;  Location: [facility];  Service: General;  Laterality: Left;  Needle localization Left breast lumpectomy  . [place]  . NEVUS EXCISION  05/16/2011   Procedure: NEVUS EXCISION;  Surgeon: Shann Medal, MD;  Location: [facility];  Service: General;  Laterality: Left;  remove mole left breast    FAMILY HISTORY: Family History  Problem Relation Age of Onset  . Cancer Mother 67       breast ca  . Alzheimer's disease Father   . Cancer Maternal Aunt        breast  . Cancer Maternal Uncle        breast  . Cancer Cousin        breast    SOCIAL HISTORY: Social History   Socioeconomic History  . Marital status: Married    Spouse name: Not on file  . Number of children: 1  . Years of education: HS  . Highest education level: Not on file  Occupational History  .  Occupation: Retired  Tobacco Use  . Smoking status: Former Smoker    Packs/day: 1.00    Years: 46.00    Pack years: 46.00    Types: Cigarettes    Quit date: 11/04/2009    Years since quitting: 10.2  . Smokeless tobacco: Never Used  Substance and Sexual Activity  . Alcohol use: No  . Drug use: No  . Sexual activity: Yes  Other Topics Concern  . Not on file  Social History Narrative   Lives at home with husband.   Right-handed.   No caffeine use.   Social Determinants of Health   Financial Resource Strain:   . Difficulty of Paying Living Expenses: Not on file  Food Insecurity:   . Worried About Charity fundraiser in the Last Year: Not on file  . Ran Out of Food in the Last Year: Not on file  Transportation Needs:    . Lack of Transportation (Medical): Not on file  . Lack of Transportation (Non-Medical): Not on file  Physical Activity:   . Days of Exercise per Week: Not on file  . Minutes of Exercise per Session: Not on file  Stress:   . Feeling of Stress : Not on file  Social Connections:   . Frequency of Communication with Friends and Family: Not on file  . Frequency of Social Gatherings with Friends and Family: Not on file  . Attends Religious Services: Not on file  . Active Member of Clubs or Organizations: Not on file  . Attends Archivist Meetings: Not on file  . Marital Status: Not on file  Intimate Partner Violence:   . Fear of Current or Ex-Partner: Not on file  . Emotionally Abused: Not on file  . Physically Abused: Not on file  . Sexually Abused: Not on file   PHYSICAL EXAM  Vitals:   02/03/20 1041  BP: (!) 167/86  Pulse: 75  Weight: 231 lb 12.8 oz (105.1 kg)  Height: 5\' 6"  (1.676 m)   Body mass index is 37.41 kg/m.  Generalized: Well developed, in no acute distress  MMSE - Mini Mental State Exam 02/03/2020 05/20/2019 11/14/2018  Not completed: - (No Data) -  Orientation to time 4 4 5   Orientation to Place 3 3 5   Registration 3 3 3   Attention/ Calculation 5 2 2   Recall 3 1 1   Language- name 2 objects 2 2 2   Language- repeat 1 1 1   Language- follow 3 step command 3 2 2   Language- follow 3 step command-comments - she grabbed it with her left hand -  Language- read & follow direction 1 1 1   Write a sentence 1 1 1   Copy design 1 1 1   Total score 27 21 24     Neurological examination  Mentation: Alert oriented to time, place, history taking. Follows all commands speech and language fluent Cranial nerve II-XII: Pupils were equal round reactive to light. Extraocular movements were full, visual field were full on confrontational test. Facial sensation and strength were normal. Head turning and shoulder shrug were normal and symmetric. Motor: The motor testing  reveals 5 over 5 strength of all 4 extremities. Good symmetric motor tone is noted throughout.  Sensory: Sensory testing is intact to soft touch on all 4 extremities. No evidence of extinction is noted.  Coordination: Cerebellar testing reveals good finger-nose-finger and heel-to-shin bilaterally.  Gait and station: Gait is normal.  Reflexes: Deep tendon reflexes are symmetric and normal bilaterally.   DIAGNOSTIC  DATA (LABS, IMAGING, TESTING) - I reviewed patient records, labs, notes, testing and imaging myself where available.  Lab Results  Component Value Date   WBC 8.7 02/12/2019   HGB 13.1 02/12/2019   HCT 39.8 02/12/2019   MCV 85.6 02/12/2019   PLT 197 02/12/2019      Component Value Date/Time   NA 144 02/12/2019 1455   NA 144 09/06/2016 1109   NA 144 02/25/2014 0931   K 3.7 02/12/2019 1455   K 3.7 02/25/2014 0931   CL 109 02/12/2019 1455   CL 109 (H) 07/30/2012 0824   CO2 25 02/12/2019 1455   CO2 25 02/25/2014 0931   GLUCOSE 103 (H) 02/12/2019 1455   GLUCOSE 105 02/25/2014 0931   GLUCOSE 100 (H) 07/30/2012 0824   BUN 12 02/12/2019 1455   BUN 19 09/06/2016 1109   BUN 13.7 02/25/2014 0931   CREATININE 0.86 02/12/2019 1455   CREATININE 0.8 02/25/2014 0931   CALCIUM 9.3 02/12/2019 1455   CALCIUM 8.7 02/25/2014 0931   PROT 7.1 02/12/2019 1455   PROT 6.9 09/06/2016 1109   PROT 6.6 02/25/2014 0931   ALBUMIN 3.6 02/12/2019 1455   ALBUMIN 3.7 09/06/2016 1109   ALBUMIN 3.1 (L) 02/25/2014 0931   AST 19 02/12/2019 1455   AST 20 02/25/2014 0931   ALT 12 02/12/2019 1455   ALT 13 02/25/2014 0931   ALKPHOS 79 02/12/2019 1455   ALKPHOS 64 02/25/2014 0931   BILITOT 0.6 02/12/2019 1455   BILITOT 0.36 02/25/2014 0931   GFRNONAA >60 02/12/2019 1455   GFRAA >60 02/12/2019 1455   No results found for: CHOL, HDL, LDLCALC, LDLDIRECT, TRIG, CHOLHDL Lab Results  Component Value Date   HGBA1C 5.4 09/06/2016   Lab Results  Component Value Date   XMIWOEHO12 248 09/06/2016    Lab Results  Component Value Date   TSH 1.200 12/27/2016   ASSESSMENT AND PLAN 74 y.o. year old female  has a past medical history of Breast cancer ([place]) (04/2011), Difficulty sleeping, Hypertension (05/11/2011), and Memory loss. here with:  1.  Memory loss  -Overall stable, MMSE 27/30 today -Continue Aricept 10 mg at bedtime -Continue Namenda 10 mg twice a day -Encouraged to continue remaining active, brain stimulating exercises -Follow-up in 1 year or sooner if needed  I spent 20 minutes of face-to-face and non-face-to-face time with patient.  This included previsit chart review, lab review, study review, order entry, electronic health record documentation, patient education.  Butler Denmark, AGNP-C, DNP 02/03/2020, 10:50 AM [facility] [address] [place], [place] [address] [phone]

## 2020-02-03 NOTE — Patient Instructions (Signed)
Memory score was good today, 27/30 Continue current medications See you back in 1 year

## 2020-02-13 ENCOUNTER — Encounter: Payer: Medicare Other | Admitting: Adult Health

## 2020-03-18 ENCOUNTER — Inpatient Hospital Stay: Payer: Medicare Other | Attending: Adult Health | Admitting: Adult Health

## 2020-03-18 ENCOUNTER — Other Ambulatory Visit: Payer: Self-pay

## 2020-03-18 ENCOUNTER — Encounter: Payer: Self-pay | Admitting: Adult Health

## 2020-03-18 VITALS — BP 136/73 | HR 85 | Temp 98.4°F | Resp 17 | Ht 66.0 in | Wt 236.0 lb

## 2020-03-18 DIAGNOSIS — D0512 Intraductal carcinoma in situ of left breast: Secondary | ICD-10-CM

## 2020-03-18 DIAGNOSIS — Z803 Family history of malignant neoplasm of breast: Secondary | ICD-10-CM | POA: Insufficient documentation

## 2020-03-18 DIAGNOSIS — Z87891 Personal history of nicotine dependence: Secondary | ICD-10-CM | POA: Insufficient documentation

## 2020-03-18 DIAGNOSIS — Z79899 Other long term (current) drug therapy: Secondary | ICD-10-CM | POA: Diagnosis not present

## 2020-03-18 DIAGNOSIS — B369 Superficial mycosis, unspecified: Secondary | ICD-10-CM | POA: Diagnosis not present

## 2020-03-18 DIAGNOSIS — Z818 Family history of other mental and behavioral disorders: Secondary | ICD-10-CM | POA: Insufficient documentation

## 2020-03-18 DIAGNOSIS — Z923 Personal history of irradiation: Secondary | ICD-10-CM | POA: Insufficient documentation

## 2020-03-18 DIAGNOSIS — Z7182 Exercise counseling: Secondary | ICD-10-CM | POA: Diagnosis not present

## 2020-03-18 DIAGNOSIS — Z170 Estrogen receptor positive status [ER+]: Secondary | ICD-10-CM | POA: Diagnosis not present

## 2020-03-18 NOTE — Progress Notes (Signed)
CLINIC:  Survivorship   REASON FOR VISIT:  Routine follow-up for history of breast cancer.   BRIEF ONCOLOGIC HISTORY:   1.  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  Adjuvant radiation therapy by Dr. Valere Dross  3.  Genetic testing negative for BRCA1 and 2   4.  Tamoxifen 20 mg started 09/27/2011-09/2017  INTERVAL HISTORY:  Ms. Million presents to the [facility] today for routine follow-up for her history of breast cancer. She is unaccompanied and is doing quite well.  She has no new breast changes or concerns.  She is eating better, and plans on starting a walking regimen soon.  She denies any health changes.  She underwent mammogram at [facility] in 08/2019 that was normal.  She says that she is up to date with her health maintenance and seeing her PCP.     REVIEW OF SYSTEMS:  Review of Systems  Constitutional: Negative for appetite change, chills, fatigue, fever and unexpected weight change.  HENT:   Negative for hearing loss, lump/mass and sore throat.   Eyes: Negative for eye problems and icterus.  Respiratory: Negative for chest tightness, cough and shortness of breath.   Cardiovascular: Negative for chest pain, leg swelling and palpitations.  Gastrointestinal: Negative for abdominal distention, abdominal pain, constipation, diarrhea, nausea and vomiting.  Endocrine: Negative for hot flashes.  Genitourinary: Negative for difficulty urinating.   Musculoskeletal: Negative for arthralgias.  Skin: Negative for itching and rash.  Neurological: Negative for dizziness, extremity weakness, headaches and numbness.  Hematological: Negative for adenopathy. Does not bruise/bleed easily.  Psychiatric/Behavioral: Negative for depression. The patient is not nervous/anxious.   Breast: Denies any new nodularity, masses, tenderness, nipple changes, or nipple discharge.       PAST MEDICAL/SURGICAL HISTORY:  Past Medical  History:  Diagnosis Date  . Breast cancer ([place]) 04/2011   DCIS left breast , ER/PR+  . Difficulty sleeping    since dx of cancer  . Hypertension 05/11/2011  . Memory loss    Past Surgical History:  Procedure Laterality Date  . BREAST LUMPECTOMY  05/16/2011   Procedure: LUMPECTOMY;  Surgeon: Shann Medal, MD;  Location: [facility];  Service: General;  Laterality: Left;  Needle localization Left breast lumpectomy  . [place]  . NEVUS EXCISION  05/16/2011   Procedure: NEVUS EXCISION;  Surgeon: Shann Medal, MD;  Location: [facility];  Service: General;  Laterality: Left;  remove mole left breast     ALLERGIES:  No Known Allergies   CURRENT MEDICATIONS:  Outpatient Encounter Medications as of 03/18/2020  Medication Sig  . cholecalciferol (VITAMIN D) 1000 units tablet Take 1,000 Units by mouth daily.  Marland Kitchen donepezil (ARICEPT) 10 MG tablet Take 1 tablet (10 mg total) by mouth at bedtime.  Marland Kitchen losartan (COZAAR) 100 MG tablet Take 100 mg by mouth daily.    . memantine (NAMENDA) 10 MG tablet Take 1 tablet (10 mg total) by mouth 2 (two) times daily.  . rosuvastatin (CRESTOR) 10 MG tablet Take 10 mg by mouth daily.   No facility-administered encounter medications on file as of 03/18/2020.     ONCOLOGIC FAMILY HISTORY:  Family History  Problem Relation Age of Onset  . Cancer Mother 61       breast ca  . Alzheimer's disease Father   . Cancer Maternal Aunt        breast  . Cancer  Maternal Uncle        breast  . Cancer Cousin        breast      SOCIAL HISTORY:  Social History   Socioeconomic History  . Marital status: Married    Spouse name: Not on file  . Number of children: 1  . Years of education: HS  . Highest education level: Not on file  Occupational History  . Occupation: Retired  Tobacco Use  . Smoking status: Former Smoker    Packs/day: 1.00    Years: 46.00    Pack years: 46.00    Types: Cigarettes    Quit date:  11/04/2009    Years since quitting: 10.3  . Smokeless tobacco: Never Used  Substance and Sexual Activity  . Alcohol use: No  . Drug use: No  . Sexual activity: Yes  Other Topics Concern  . Not on file  Social History Narrative   Lives at home with husband.   Right-handed.   No caffeine use.   Social Determinants of Health   Financial Resource Strain:   . Difficulty of Paying Living Expenses: Not on file  Food Insecurity:   . Worried About Charity fundraiser in the Last Year: Not on file  . Ran Out of Food in the Last Year: Not on file  Transportation Needs:   . Lack of Transportation (Medical): Not on file  . Lack of Transportation (Non-Medical): Not on file  Physical Activity:   . Days of Exercise per Week: Not on file  . Minutes of Exercise per Session: Not on file  Stress:   . Feeling of Stress : Not on file  Social Connections:   . Frequency of Communication with Friends and Family: Not on file  . Frequency of Social Gatherings with Friends and Family: Not on file  . Attends Religious Services: Not on file  . Active Member of Clubs or Organizations: Not on file  . Attends Archivist Meetings: Not on file  . Marital Status: Not on file  Intimate Partner Violence:   . Fear of Current or Ex-Partner: Not on file  . Emotionally Abused: Not on file  . Physically Abused: Not on file  . Sexually Abused: Not on file        PHYSICAL EXAMINATION:  Vital Signs: Vitals:   03/18/20 1111  BP: 136/73  Pulse: 85  Resp: 17  Temp: 98.4 F (36.9 C)  SpO2: 97%   Filed Weights   03/18/20 1111  Weight: 236 lb (107 kg)   General: Well-nourished, well-appearing female in no acute distress.  Unaccompanied today.   HEENT: Head is normocephalic.  Pupils equal and reactive to light. Conjunctivae clear without exudate.  Sclerae anicteric. Mask in place.  Lymph: No cervical, supraclavicular, or infraclavicular lymphadenopathy noted on palpation.  Cardiovascular: Regular  rate and rhythm.Marland Kitchen Respiratory: Clear to auscultation bilaterally. Chest expansion symmetric; breathing non-labored.  Breast Exam:  -Left breast: No appreciable masses on palpation. No skin redness, thickening, or peau d'orange appearance; no nipple retraction or nipple discharge; mild distortion in symmetry at previous lumpectomy site well healed scar without erythema or nodularity.  -Right breast: No appreciable masses on palpation. No skin redness, thickening, or peau d'orange appearance; no nipple retraction or nipple discharge; Fungal rash noted underneath bilateral breasts -Axilla: No axillary adenopathy bilaterally.  GI: Abdomen soft and round; non-tender, non-distended. Bowel sounds normoactive. No hepatosplenomegaly.   GU: Deferred.  Neuro: No focal deficits. Steady gait.  Psych: Mood  and affect normal and appropriate for situation.  MSK: No focal spinal tenderness to palpation, full range of motion in bilateral upper extremities Extremities: No edema. Skin: Warm and dry.  LABORATORY DATA:  None for this visit   DIAGNOSTIC IMAGING:  Most recent mammogram: normal 08/10/2019    ASSESSMENT AND PLAN:  Ms.. Szwed is a pleasant 74 y.o. female with history of Stage 0 left breast DCIS, ER+, diagnosed in 04/2011, treated with lumpectomy, adjuvant radiation therapy, and anti-estrogen therapy with Tamoxifen therapy x 5 years completed in 09/2016.  She [facility] for surveillance and routine follow-up.   1. History of breast cancer:  Ms. Woollard is currently clinically and radiographically without evidence of disease or recurrence of breast cancer. She will be due for mammogram in 08/2020.Marland Kitchen   She will return in one year for LTS follow up.  I encouraged her to call me with any questions or concerns before her next visit at the cancer center, and I would be happy to see her sooner, if needed.    2. Bone health:  Given Ms. Servellon's age, history of breast cancer, she is at risk  for bone demineralization.  She was given education on specific food and activities to promote bone health.  3. Cancer screening:  Due to Ms. [place] history and her age, she should receive screening for skin cancers, colon cancer, lung cancer, and gynecologic cancers. She was encouraged to follow-up with her PCP for appropriate cancer screenings.   4. Health maintenance and wellness promotion: Ms. Rundell was encouraged to consume 5-7 servings of fruits and vegetables per day. She was also encouraged to engage in moderate to vigorous exercise for 30 minutes per day most days of the week. She was instructed to limit her alcohol consumption and continue to abstain from tobacco use.  5.  Fungal rash: I recommended she get OTC antifungal powder to help with this.  I wrote it down for her.      Dispo:  -Return to cancer center in one year for LTS follow up -Mammogram due in 08/2020  Total encounter time: 20 minutes*  Wilber Bihari, NP 03/18/20 11:48 AM Medical Oncology and Hematology [facility] [place], [place] [address] Tel. [phone]    Fax. [phone]  *Total Encounter Time as defined by the Centers for Medicare and Medicaid Services includes, in addition to the face-to-face time of a patient visit (documented in the note above) non-face-to-face time: obtaining and reviewing outside history, ordering and reviewing medications, tests or procedures, care coordination (communications with other health care professionals or caregivers) and documentation in the medical record.   Note: PRIMARY CARE PROVIDER [name], [place] [phone]

## 2020-04-24 DIAGNOSIS — I129 Hypertensive chronic kidney disease with stage 1 through stage 4 chronic kidney disease, or unspecified chronic kidney disease: Secondary | ICD-10-CM | POA: Diagnosis not present

## 2020-04-24 DIAGNOSIS — R7309 Other abnormal glucose: Secondary | ICD-10-CM | POA: Diagnosis not present

## 2020-04-24 DIAGNOSIS — E0500 Thyrotoxicosis with diffuse goiter without thyrotoxic crisis or storm: Secondary | ICD-10-CM | POA: Diagnosis not present

## 2020-04-24 DIAGNOSIS — N390 Urinary tract infection, site not specified: Secondary | ICD-10-CM | POA: Diagnosis not present

## 2020-04-27 DIAGNOSIS — I700 Atherosclerosis of aorta: Secondary | ICD-10-CM | POA: Diagnosis not present

## 2020-04-27 DIAGNOSIS — R600 Localized edema: Secondary | ICD-10-CM | POA: Diagnosis not present

## 2020-04-27 DIAGNOSIS — I5032 Chronic diastolic (congestive) heart failure: Secondary | ICD-10-CM | POA: Diagnosis not present

## 2020-04-27 DIAGNOSIS — I129 Hypertensive chronic kidney disease with stage 1 through stage 4 chronic kidney disease, or unspecified chronic kidney disease: Secondary | ICD-10-CM | POA: Diagnosis not present

## 2020-04-27 DIAGNOSIS — Z0000 Encounter for general adult medical examination without abnormal findings: Secondary | ICD-10-CM | POA: Diagnosis not present

## 2020-06-16 DIAGNOSIS — I129 Hypertensive chronic kidney disease with stage 1 through stage 4 chronic kidney disease, or unspecified chronic kidney disease: Secondary | ICD-10-CM | POA: Diagnosis not present

## 2020-06-16 DIAGNOSIS — E0590 Thyrotoxicosis, unspecified without thyrotoxic crisis or storm: Secondary | ICD-10-CM | POA: Diagnosis not present

## 2020-06-16 DIAGNOSIS — E890 Postprocedural hypothyroidism: Secondary | ICD-10-CM | POA: Diagnosis not present

## 2020-06-24 ENCOUNTER — Ambulatory Visit (INDEPENDENT_AMBULATORY_CARE_PROVIDER_SITE_OTHER)
Admission: RE | Admit: 2020-06-24 | Discharge: 2020-06-24 | Disposition: A | Discharge status: Home / Self Care | Payer: Medicare Other | Source: Ambulatory Visit | Attending: Acute Care | Admitting: Acute Care

## 2020-06-24 ENCOUNTER — Other Ambulatory Visit: Payer: Self-pay

## 2020-06-24 DIAGNOSIS — Z87891 Personal history of nicotine dependence: Secondary | ICD-10-CM

## 2020-06-25 ENCOUNTER — Telehealth: Payer: Self-pay | Admitting: Acute Care

## 2020-06-25 DIAGNOSIS — Z87891 Personal history of nicotine dependence: Secondary | ICD-10-CM

## 2020-06-25 NOTE — Telephone Encounter (Signed)
-----   Message from Annie Paras sent at 06/25/2020 10:05 AM EST ----- Regarding: Radiology Follow Up Lung-RADS 2, benign appearance or behavior. Continue annual screening with low-dose chest CT without contrast in 12 months.

## 2020-06-25 NOTE — Telephone Encounter (Signed)
[facility]:  We were contacted by the [employer]idental Findings Program.  Recommendations: This will be called to the patient by the lung cancer screening program Appointment requested: Annual screening due 06/2021 Time frame: Annual Screening due 06/2021  Magdalen Spatz, NP [facility] 06/25/2020 10:20 AM

## 2020-06-29 DIAGNOSIS — I10 Essential (primary) hypertension: Secondary | ICD-10-CM | POA: Diagnosis not present

## 2020-07-07 NOTE — Progress Notes (Signed)
Please call patient and let them  know their  low dose Ct was read as a Lung RADS 2: nodules that are benign in appearance and behavior with a very low likelihood of becoming a clinically active cancer due to size or lack of growth. Recommendation per radiology is for a repeat LDCT in 12 months. Please let them  know we will order and schedule their  annual screening scan for 06/2021. Please let them  know there was notation of CAD on their  scan.  Please remind the patient  that this is a non-gated exam therefore degree or severity of disease  cannot be determined. Please have them  follow up with their PCP regarding potential risk factor modification, dietary therapy or pharmacologic therapy if clinically indicated. Pt.  is  currently on statin therapy. Please place order for annual  screening scan for  06/2021 and fax results to PCP. Thanks so much.  Denise, 2 vessel CAD noted. She is on statin. Have Jasmyn follow up with Dr. Shelia Media. Thanks so much

## 2020-07-09 NOTE — Telephone Encounter (Signed)
Patient states needs results of CT scan. Patient phone number is [phone].

## 2020-07-13 NOTE — Telephone Encounter (Signed)
Pt informed of CT results per Sarah Groce, NP.  PT verbalized understanding.  Copy sent to PCP.  Order placed for 1 yr f/u CT.  

## 2020-07-15 ENCOUNTER — Telehealth: Payer: Self-pay | Admitting: Acute Care

## 2020-07-15 NOTE — Telephone Encounter (Signed)
Called patient , left vm. Results given 2/7 by Twanna Hy

## 2020-07-17 DIAGNOSIS — J449 Chronic obstructive pulmonary disease, unspecified: Secondary | ICD-10-CM | POA: Diagnosis not present

## 2020-07-17 DIAGNOSIS — I2510 Atherosclerotic heart disease of native coronary artery without angina pectoris: Secondary | ICD-10-CM | POA: Diagnosis not present

## 2020-07-17 DIAGNOSIS — N281 Cyst of kidney, acquired: Secondary | ICD-10-CM | POA: Diagnosis not present

## 2020-07-17 DIAGNOSIS — I700 Atherosclerosis of aorta: Secondary | ICD-10-CM | POA: Diagnosis not present

## 2020-07-17 DIAGNOSIS — R911 Solitary pulmonary nodule: Secondary | ICD-10-CM | POA: Diagnosis not present

## 2020-07-20 ENCOUNTER — Other Ambulatory Visit: Payer: Self-pay | Admitting: Internal Medicine

## 2020-07-20 DIAGNOSIS — N281 Cyst of kidney, acquired: Secondary | ICD-10-CM

## 2020-07-27 ENCOUNTER — Encounter: Payer: Self-pay | Admitting: Cardiology

## 2020-07-27 ENCOUNTER — Ambulatory Visit: Payer: Medicare Other | Admitting: Cardiology

## 2020-07-27 ENCOUNTER — Other Ambulatory Visit: Payer: Self-pay

## 2020-07-27 VITALS — BP 180/67 | HR 75 | Temp 98.0°F | Resp 16 | Ht 66.0 in | Wt 239.0 lb

## 2020-07-27 DIAGNOSIS — I10 Essential (primary) hypertension: Secondary | ICD-10-CM | POA: Diagnosis not present

## 2020-07-27 DIAGNOSIS — Z01810 Encounter for preprocedural cardiovascular examination: Secondary | ICD-10-CM | POA: Diagnosis not present

## 2020-07-27 DIAGNOSIS — E7800 Pure hypercholesterolemia, unspecified: Secondary | ICD-10-CM

## 2020-07-27 DIAGNOSIS — I2510 Atherosclerotic heart disease of native coronary artery without angina pectoris: Secondary | ICD-10-CM | POA: Diagnosis not present

## 2020-07-27 MED ORDER — AMLODIPINE BESYLATE 5 MG PO TABS: 5.0000 mg | ORAL_TABLET | Freq: Every day | ORAL | 2 refills | Status: DC

## 2020-07-27 MED ORDER — METOPROLOL SUCCINATE ER 25 MG PO TB24: 25.0000 mg | ORAL_TABLET | Freq: Every day | ORAL | 2 refills | Status: DC

## 2020-07-27 NOTE — Progress Notes (Signed)
Primary Physician/Referring:  [name], MD  Patient ID: Maria Bolton, female    DOB: 06-05-1946, 75 y.o.   MRN: [id number]  Chief Complaint  Patient presents with  . Coronary Artery Disease  . New Patient (Initial Visit)   HPI:    Maria Bolton  is a 75 y.o. Caucasian female with hypertension, hyperglycemia, hyperlipidemia, breast cancer status post radiation therapy in 2012, morbid obesity, fatty liver, chronic diastolic heart failure who is referred back to me for evaluation of abnormal CT scan revealing significant coronary calcification.  I had last seen her in 2017.  Patient has markedly sedentary lifestyle, especially in view of her degenerative joint disease and left knee pain she has limited her activity.  Denies dyspnea, palpitations or chest pain.  Accompanied by husband.  They plan on proceeding with surgery soon for the left knee.  Past Medical History:  Diagnosis Date  . 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 Difficulty sleeping    since dx of cancer  . Hypertension 05/11/2011  . Memory loss    Past Surgical History:  Procedure Laterality Date  . BREAST LUMPECTOMY  05/16/2011   Procedure: LUMPECTOMY;  Surgeon: Shann Medal, MD;  Location: [place];  Service: General;  Laterality: Left;  Needle localization Left breast lumpectomy  . [place]  . NEVUS EXCISION  05/16/2011   Procedure: NEVUS EXCISION;  Surgeon: Shann Medal, MD;  Location: [place];  Service: General;  Laterality: Left;  remove mole left breast   Family History  Problem Relation Age of Onset  . Cancer Mother 2       breast ca  . Alzheimer's disease Father   . Cancer Maternal Aunt        breast  . Cancer Maternal Uncle        breast  . Cancer Cousin        breast    Social History   Tobacco Use  . Smoking status: Former Smoker    Packs/day: 1.00    Years: 46.00    Pack years: 46.00    Types: Cigarettes    Quit date:  11/04/2009    Years since quitting: 10.7  . Smokeless tobacco: Never Used  Substance Use Topics  . Alcohol use: No   Marital Status: Married  ROS  Review of Systems  Cardiovascular: Negative for chest pain, dyspnea on exertion and leg swelling.  Musculoskeletal: Positive for arthritis, joint pain and joint swelling (left knee).  Gastrointestinal: Negative for melena.   Objective  Blood pressure (!) 180/67, pulse 75, temperature 98 F (36.7 C), resp. rate 16, height 5\' 6"  (1.676 m), weight 239 lb (108.4 kg), SpO2 97 %.  Vitals with BMI 07/27/2020 07/27/2020 03/18/2020  Height - 5\' 6"  5\' 6"   Weight - 239 lbs 236 lbs  BMI - 00.92 33.00  Systolic 762 263 335  Diastolic 67 78 73  Pulse 75 80 85     Physical Exam Constitutional:      Comments: Morbidly obese in no acute distress.  Cardiovascular:     Rate and Rhythm: Normal rate and regular rhythm.     Pulses:          Carotid pulses are 2+ on the right side and 2+ on the left side.      Dorsalis pedis pulses are 2+ on the right side and 1+ on the left side.  Posterior tibial pulses are 2+ on the right side and 1+ on the left side.     Heart sounds: Normal heart sounds. No murmur heard. No gallop.      Comments: Femoral and popliteal pulse difficult to feel due to patient's body habitus.  No leg edema. JVD difficult to see due to short neck. Pulmonary:     Effort: Pulmonary effort is normal.     Breath sounds: Normal breath sounds.  Abdominal:     General: Bowel sounds are normal.     Palpations: Abdomen is soft.     Comments: Obese. Pannus present  Neurological:     General: No focal deficit present.     Mental Status: She is oriented to person, place, and time.    Laboratory examination:   No results for input(s): NA, K, CL, CO2, GLUCOSE, BUN, CREATININE, CALCIUM, GFRNONAA, GFRAA in the last 8760 hours. CrCl cannot be calculated (Patient's most recent lab result is older than the maximum 21 days allowed.).  CMP  Latest Ref Rng & Units 02/12/2019 09/06/2016 09/18/2015  Glucose 70 - 99 mg/dL 103(H) 94 113(H)  BUN 8 - 23 mg/dL 12 19 12   Creatinine 0.44 - 1.00 mg/dL 0.86 0.73 0.70  Sodium 135 - 145 mmol/L 144 144 141  Potassium 3.5 - 5.1 mmol/L 3.7 3.8 3.6  Chloride 98 - 111 mmol/L 109 105 105  CO2 22 - 32 mmol/L 25 24 -  Calcium 8.9 - 10.3 mg/dL 9.3 9.2 -  Total Protein 6.5 - 8.1 g/dL 7.1 6.9 -  Total Bilirubin 0.3 - 1.2 mg/dL 0.6 0.2 -  Alkaline Phos 38 - 126 U/L 79 69 -  AST 15 - 41 U/L 19 24 -  ALT 0 - 44 U/L 12 20 -   CBC Latest Ref Rng & Units 02/12/2019 09/06/2016 09/18/2015  WBC 4.0 - 10.5 K/uL 8.7 7.1 -  Hemoglobin 12.0 - 15.0 g/dL 13.1 13.6 13.9  Hematocrit 36.0 - 46.0 % 39.8 41.3 41.0  Platelets 150 - 400 K/uL 197 170 -    External labs:   Cholesterol, total 143.000 m 04/24/2020 HDL 67.000 mg 04/24/2020 LDL 60.000 mg 04/24/2020 Triglycerides 87.000 mg 04/24/2020  Hemoglobin 13.100 g/d 02/12/2019 Platelets 197.000 K/ 02/12/2019  Creatinine, Serum 0.920 MG/ 04/24/2020 Potassium 3.700 mm 02/12/2019 Magnesium 2.100 MG/ 03/11/2016  ALT (SGPT) 10.000 IU/ 04/24/2020  TSH 5.850 04/24/2020  Medications and allergies  No Known Allergies   Outpatient Medications Prior to Visit  Medication Sig Dispense Refill  . albuterol (VENTOLIN HFA) 108 (90 Base) MCG/ACT inhaler 2 puffs as needed    . cholecalciferol (VITAMIN D) 1000 units tablet Take 1,000 Units by mouth daily.    Marland Kitchen levothyroxine (SYNTHROID) 25 MCG tablet Take 25 mcg by mouth every morning.    Marland Kitchen losartan (COZAAR) 100 MG tablet Take 100 mg by mouth daily.    . memantine (NAMENDA) 10 MG tablet Take 1 tablet (10 mg total) by mouth 2 (two) times daily. 60 tablet 11  . rosuvastatin (CRESTOR) 10 MG tablet Take 10 mg by mouth daily.    Marland Kitchen donepezil (ARICEPT) 10 MG tablet Take 1 tablet (10 mg total) by mouth at bedtime. 30 tablet 11   No facility-administered medications prior to visit.   Radiology:   Low-dose CT scan of the chest for lung  cancer screening 06/24/2020:  . 1. Lung-RADS 2, benign appearance or behavior. Continue annual screening with low-dose chest CT without contrast in 12 months. 2. Two-vessel coronary atherosclerosis. Left anterior  descending and right coronary atherosclerosis. Atherosclerotic nonaneurysmal thoracic aorta.  Aberrant nonaneurysmal right subclavian artery arising from distal aortic arch with retroesophageal course. Normal caliber pulmonary arteries 3. Aberrant right subclavian artery. 4. Stable left adrenal adenoma. 5. Cholelithiasis. 6. Aortic Atherosclerosis   Cardiac Studies:   Echocardiogram 05/12/2015: 1. Left ventricle cavity is normal in size. Moderate concentric hypertrophy of the left ventricle. Normal global Placencia motion. Doppler evidence of grade I (impaired) diastolic dysfunction. Calculated EF 67%. 2. Left atrial cavity is mildly dilated. 3. Right ventricle cavity is mildly dilated. Mild concentric hypertrophy of the right ventricle. Normal right ventricular function. 4. Trace tricuspid regurgitation. Unable to estimate PA pressure due to absence/minimal TR signal. IVC not well visualized and unable to comment on right heart pressure. 5. No evidence of significant pericardial effusion. Pericardial fat pad.  Lexiscan myoview stress test 02/29/2016: 1. The resting electrocardiogram demonstrated normal sinus rhythm, normal resting conduction, no resting arrhythmias and normal rest repolarization. Stress EKG is non-diagnostic for ischemia as it a pharmacologic stress using Lexiscan. Stress symptoms included dizziness. 2. Myocardial perfusion imaging is normal. Overall left ventricular systolic function was normal without regional Hepburn motion abnormalities. The left ventricular ejection fraction was 73%.  ABI 03/09/2016: This exam reveals mildly decreased perfusion of the lower extremity, with RABI 0.91 and LABI 0.88 with biphasic waveform noted at the post tibial artery level.  EKG:      EKG 07/27/2020: Normal sinus rhythm with rate of 72 bpm, normal axis.  Poor R progression, probably normal variant.  No evidence of ischemia, normal EKG.   no significant change from 04/27/2020  Assessment     ICD-10-CM   1. Coronary artery disease involving native coronary artery of native heart without angina pectoris  I25.10 EKG 12-Lead    PCV ECHOCARDIOGRAM COMPLETE    PCV MYOCARDIAL PERFUSION WITH LEXISCAN  2. Coronary artery calcification seen on CAT scan  I25.10   3. Preoperative cardiovascular examination  Z01.810   4. Hypercholesteremia  E78.00   5. Primary hypertension  I10 PCV ECHOCARDIOGRAM COMPLETE    PCV MYOCARDIAL PERFUSION WITH LEXISCAN    amLODipine (NORVASC) 5 MG tablet    metoprolol succinate (TOPROL-XL) 25 MG 24 hr tablet     Medications Discontinued During This Encounter  Medication Reason  . donepezil (ARICEPT) 10 MG tablet Error    Meds ordered this encounter  Medications  . amLODipine (NORVASC) 5 MG tablet    Sig: Take 1 tablet (5 mg total) by mouth daily.    Dispense:  30 tablet    Refill:  2  . metoprolol succinate (TOPROL-XL) 25 MG 24 hr tablet    Sig: Take 1 tablet (25 mg total) by mouth daily. Take with or immediately following a meal.    Dispense:  30 tablet    Refill:  2   Orders Placed This Encounter  Procedures  . PCV MYOCARDIAL PERFUSION WITH LEXISCAN    Standing Status:   Future    Standing Expiration Date:   09/24/2020  . EKG 12-Lead  . PCV ECHOCARDIOGRAM COMPLETE    Standing Status:   Future    Standing Expiration Date:   07/27/2021   Recommendations:   Maria Bolton is a 75 y.o. Caucasian female with hypertension, hyperglycemia, hyperlipidemia, breast cancer status post radiation therapy in 2012, morbid obesity, fatty liver, chronic diastolic heart failure who is referred back to me for evaluation of abnormal CT scan revealing significant coronary calcification.  I had last seen her in 2017.  Patient has markedly sedentary  lifestyle, especially in view of her degenerative joint disease and left knee pain she has limited her activity.  She plans on having left knee surgery and replacement soon.  I reviewed the CT report of her chest, revealing significant amount of coronary calcification.  Patient has had a nuclear stress test in 2017 which was nonischemic.  However in view of abnormal EKG and patient planning surgery and coronary calcification noted in 2 major vessels, and prior radiation therapy to her chest, I have recommended that we proceed with repeating stress test and also an echocardiogram.  I had extensive discussion with the patient and her husband at the bedside regarding making lifestyle changes and reducing her calorie intake.  With regard to her hypertension today, in view of coronary calcification and possible significant CAD, I have added amlodipine 5 mg along with metoprolol succinate 25 mg daily.  I would like to see her back after the test and make further recommendations.  She is presently not on aspirin and will consider addition of 81 mg of aspirin.  Lipids are at goal, renal function is normal.    Adrian Prows, MD, [facility] 07/27/2020, 2:41 PM Office: [phone]

## 2020-07-28 DIAGNOSIS — E890 Postprocedural hypothyroidism: Secondary | ICD-10-CM | POA: Diagnosis not present

## 2020-07-30 ENCOUNTER — Ambulatory Visit
Admission: RE | Admit: 2020-07-30 | Discharge: 2020-07-30 | Disposition: A | Discharge status: Home / Self Care | Payer: Medicare Other | Source: Ambulatory Visit | Attending: Internal Medicine | Admitting: Internal Medicine

## 2020-07-30 DIAGNOSIS — N281 Cyst of kidney, acquired: Secondary | ICD-10-CM | POA: Diagnosis not present

## 2020-08-05 ENCOUNTER — Other Ambulatory Visit: Payer: Medicare Other

## 2020-08-10 ENCOUNTER — Ambulatory Visit: Payer: Medicare Other

## 2020-08-10 ENCOUNTER — Other Ambulatory Visit: Payer: Self-pay

## 2020-08-10 DIAGNOSIS — I2510 Atherosclerotic heart disease of native coronary artery without angina pectoris: Secondary | ICD-10-CM | POA: Diagnosis not present

## 2020-08-10 DIAGNOSIS — I10 Essential (primary) hypertension: Secondary | ICD-10-CM | POA: Diagnosis not present

## 2020-08-12 DIAGNOSIS — M1712 Unilateral primary osteoarthritis, left knee: Secondary | ICD-10-CM | POA: Diagnosis not present

## 2020-08-12 DIAGNOSIS — M25462 Effusion, left knee: Secondary | ICD-10-CM | POA: Diagnosis not present

## 2020-08-12 DIAGNOSIS — M25562 Pain in left knee: Secondary | ICD-10-CM | POA: Diagnosis not present

## 2020-08-13 ENCOUNTER — Other Ambulatory Visit: Payer: Self-pay

## 2020-08-13 ENCOUNTER — Ambulatory Visit: Payer: Medicare Other

## 2020-08-13 DIAGNOSIS — I2510 Atherosclerotic heart disease of native coronary artery without angina pectoris: Secondary | ICD-10-CM

## 2020-08-13 DIAGNOSIS — I10 Essential (primary) hypertension: Secondary | ICD-10-CM | POA: Diagnosis not present

## 2020-08-14 NOTE — Progress Notes (Signed)
Normal stress test

## 2020-08-17 NOTE — Progress Notes (Signed)
Normal echo.

## 2020-08-19 ENCOUNTER — Other Ambulatory Visit: Payer: Self-pay | Admitting: Cardiology

## 2020-08-19 DIAGNOSIS — I10 Essential (primary) hypertension: Secondary | ICD-10-CM

## 2020-08-20 DIAGNOSIS — Z1231 Encounter for screening mammogram for malignant neoplasm of breast: Secondary | ICD-10-CM | POA: Diagnosis not present

## 2020-08-27 ENCOUNTER — Encounter: Payer: Self-pay | Admitting: Cardiology

## 2020-08-27 ENCOUNTER — Ambulatory Visit: Payer: Medicare Other | Admitting: Cardiology

## 2020-08-27 ENCOUNTER — Other Ambulatory Visit: Payer: Self-pay

## 2020-08-27 VITALS — BP 126/80 | HR 83 | Temp 97.4°F | Resp 17 | Ht 66.0 in | Wt 228.4 lb

## 2020-08-27 DIAGNOSIS — I10 Essential (primary) hypertension: Secondary | ICD-10-CM

## 2020-08-27 DIAGNOSIS — I2510 Atherosclerotic heart disease of native coronary artery without angina pectoris: Secondary | ICD-10-CM

## 2020-08-27 DIAGNOSIS — Z01810 Encounter for preprocedural cardiovascular examination: Secondary | ICD-10-CM

## 2020-08-27 DIAGNOSIS — E7800 Pure hypercholesterolemia, unspecified: Secondary | ICD-10-CM

## 2020-08-27 DIAGNOSIS — M1712 Unilateral primary osteoarthritis, left knee: Secondary | ICD-10-CM | POA: Insufficient documentation

## 2020-08-27 MED ORDER — AMLODIPINE BESYLATE 5 MG PO TABS: ORAL_TABLET | ORAL | 3 refills | Status: DC

## 2020-08-27 MED ORDER — METOPROLOL SUCCINATE ER 25 MG PO TB24: 25.0000 mg | ORAL_TABLET | Freq: Every day | ORAL | 3 refills | Status: AC

## 2020-08-27 NOTE — Progress Notes (Signed)
Primary Physician/Referring:  [name], MD  Patient ID: Maria Bolton, female    DOB: 1945/06/16, 75 y.o.   MRN: [id number]  Chief Complaint  Patient presents with  . Follow-up    4 WEEKS  . Coronary Artery Disease  . Hypertension   HPI:    Maria Bolton  is a 75 y.o. Caucasian female with hypertension, hyperglycemia, hyperlipidemia, breast cancer status post radiation therapy in 2012, morbid obesity, fatty liver, chronic diastolic heart failure who is referred back to me for evaluation of abnormal CT scan revealing significant coronary calcification.  Patient has markedly sedentary lifestyle, especially in view of her degenerative joint disease and left knee pain she has limited her activity.  She is in constant pain in her left knee.  She is now scheduled for orthopedic evaluation with Dr. Rod Can and will need TKA soon. Denies dyspnea, palpitations or chest pain.  Accompanied by husband.    Past Medical History:  Diagnosis Date  . 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 Difficulty sleeping    since dx of cancer  . Hypertension 05/11/2011  . Memory loss    Past Surgical History:  Procedure Laterality Date  . BREAST LUMPECTOMY  05/16/2011   Procedure: LUMPECTOMY;  Surgeon: Shann Medal, MD;  Location: [place];  Service: General;  Laterality: Left;  Needle localization Left breast lumpectomy  . [place]  . NEVUS EXCISION  05/16/2011   Procedure: NEVUS EXCISION;  Surgeon: Shann Medal, MD;  Location: [place];  Service: General;  Laterality: Left;  remove mole left breast   Family History  Problem Relation Age of Onset  . Cancer Mother 76       breast ca  . Alzheimer's disease Father   . Cancer Maternal Aunt        breast  . Cancer Maternal Uncle        breast  . Cancer Cousin        breast    Social History   Tobacco Use  . Smoking status: Former Smoker    Packs/day: 1.00    Years: 46.00     Pack years: 46.00    Types: Cigarettes    Quit date: 11/04/2009    Years since quitting: 10.8  . Smokeless tobacco: Never Used  Substance Use Topics  . Alcohol use: No   Marital Status: Married  ROS  Review of Systems  Cardiovascular: Negative for chest pain, dyspnea on exertion and leg swelling.  Musculoskeletal: Positive for arthritis, joint pain and joint swelling (left knee).  Gastrointestinal: Negative for melena.   Objective  Blood pressure 126/80, pulse 83, temperature (!) 97.4 F (36.3 C), temperature source Temporal, resp. rate 17, height 5\' 6"  (1.676 m), weight 228 lb 6.4 oz (103.6 kg), SpO2 98 %.  Vitals with BMI 08/27/2020 07/27/2020 07/27/2020  Height 5\' 6"  - 5\' 6"   Weight 228 lbs 6 oz - 239 lbs  BMI 40.98 - 11.91  Systolic 478 295 621  Diastolic 80 67 78  Pulse 83 75 80     Physical Exam Constitutional:      Comments: Morbidly obese in no acute distress.  Cardiovascular:     Rate and Rhythm: Normal rate and regular rhythm.     Pulses:          Carotid pulses are 2+ on the right side and 2+ on the left side.  Dorsalis pedis pulses are 2+ on the right side and 1+ on the left side.       Posterior tibial pulses are 2+ on the right side and 1+ on the left side.     Heart sounds: Normal heart sounds. No murmur heard. No gallop.      Comments: Femoral and popliteal pulse difficult to feel due to patient's body habitus.  No leg edema. JVD difficult to see due to short neck. Pulmonary:     Effort: Pulmonary effort is normal.     Breath sounds: Normal breath sounds.  Abdominal:     General: Bowel sounds are normal.     Palpations: Abdomen is soft.     Comments: Obese. Pannus present  Neurological:     General: No focal deficit present.     Mental Status: She is oriented to person, place, and time.    Laboratory examination:   No results for input(s): NA, K, CL, CO2, GLUCOSE, BUN, CREATININE, CALCIUM, GFRNONAA, GFRAA in the last 8760 hours. CrCl cannot be  calculated (Patient's most recent lab result is older than the maximum 21 days allowed.).  CMP Latest Ref Rng & Units 02/12/2019 09/06/2016 09/18/2015  Glucose 70 - 99 mg/dL 103(H) 94 113(H)  BUN 8 - 23 mg/dL 12 19 12   Creatinine 0.44 - 1.00 mg/dL 0.86 0.73 0.70  Sodium 135 - 145 mmol/L 144 144 141  Potassium 3.5 - 5.1 mmol/L 3.7 3.8 3.6  Chloride 98 - 111 mmol/L 109 105 105  CO2 22 - 32 mmol/L 25 24 -  Calcium 8.9 - 10.3 mg/dL 9.3 9.2 -  Total Protein 6.5 - 8.1 g/dL 7.1 6.9 -  Total Bilirubin 0.3 - 1.2 mg/dL 0.6 0.2 -  Alkaline Phos 38 - 126 U/L 79 69 -  AST 15 - 41 U/L 19 24 -  ALT 0 - 44 U/L 12 20 -   CBC Latest Ref Rng & Units 02/12/2019 09/06/2016 09/18/2015  WBC 4.0 - 10.5 K/uL 8.7 7.1 -  Hemoglobin 12.0 - 15.0 g/dL 13.1 13.6 13.9  Hematocrit 36.0 - 46.0 % 39.8 41.3 41.0  Platelets 150 - 400 K/uL 197 170 -    External labs:   Cholesterol, total 143.000 m 04/24/2020 HDL 67.000 mg 04/24/2020 LDL 60.000 mg 04/24/2020 Triglycerides 87.000 mg 04/24/2020  Hemoglobin 13.100 g/d 02/12/2019 Platelets 197.000 K/ 02/12/2019  Creatinine, Serum 0.920 MG/ 04/24/2020 Potassium 3.700 mm 02/12/2019 Magnesium 2.100 MG/ 03/11/2016  ALT (SGPT) 10.000 IU/ 04/24/2020  TSH 5.850 04/24/2020  Medications and allergies  No Known Allergies   Outpatient Medications Prior to Visit  Medication Sig Dispense Refill  . albuterol (VENTOLIN HFA) 108 (90 Base) MCG/ACT inhaler 2 puffs as needed    . cholecalciferol (VITAMIN D) 1000 units tablet Take 1,000 Units by mouth daily.    Marland Kitchen levothyroxine (SYNTHROID) 25 MCG tablet Take 25 mcg by mouth every morning.    Marland Kitchen losartan (COZAAR) 100 MG tablet Take 100 mg by mouth daily.    . memantine (NAMENDA) 10 MG tablet Take 1 tablet (10 mg total) by mouth 2 (two) times daily. 60 tablet 11  . rosuvastatin (CRESTOR) 10 MG tablet Take 10 mg by mouth daily.    Marland Kitchen amLODipine (NORVASC) 5 MG tablet TAKE 1 TABLET(5 MG) BY MOUTH DAILY 30 tablet 2  . metoprolol succinate (TOPROL-XL)  25 MG 24 hr tablet Take 1 tablet (25 mg total) by mouth daily. Take with or immediately following a meal. 30 tablet 2   No facility-administered  medications prior to visit.   Radiology:   Low-dose CT scan of the chest for lung cancer screening 06/24/2020:  . 1. Lung-RADS 2, benign appearance or behavior. Continue annual screening with low-dose chest CT without contrast in 12 months. 2. Two-vessel coronary atherosclerosis. Left anterior descending and right coronary atherosclerosis. Atherosclerotic nonaneurysmal thoracic aorta.  Aberrant nonaneurysmal right subclavian artery arising from distal aortic arch with retroesophageal course. Normal caliber pulmonary arteries 3. Aberrant right subclavian artery. 4. Stable left adrenal adenoma. 5. Cholelithiasis. 6. Aortic Atherosclerosis   Cardiac Studies:    ABI 03/09/2016: This exam reveals mildly decreased perfusion of the lower extremity, with RABI 0.91 and LABI 0.88 with biphasic waveform noted at the post tibial artery level.  Lexiscan Tetrofosmin stress test 08/10/2020: 1 Day Rest/Stress Protocol. Stress EKG is non-diagnostic, as this is pharmacological stress test using Lexiscan. Normal myocardial perfusion, without convincing evidence of reversible ischemia or prior infarct. LVEF 61%. Left ventricular size is normal, myocardial Drumwright thickness is preserved without regional Blevens motion abnormalities. No prior studies available for comparison. Low risk study.  Echocardiogram 08/13/2020: Normal LV systolic function with visual EF 60-65%. Left ventricle cavity is normal in size. Mild left ventricular hypertrophy. Normal global Ellzey motion. Normal diastolic filling pattern, normal LAP.  No significant valvular abnormalities. Compared to prior study dated 05/12/2015: No significant change.    EKG:     EKG 07/27/2020: Normal sinus rhythm with rate of 72 bpm, normal axis.  Poor R progression, probably normal variant.  No evidence of  ischemia, normal EKG.   no significant change from 04/27/2020  Assessment     ICD-10-CM   1. Preoperative cardiovascular examination  Z01.810   2. Coronary artery disease involving native coronary artery of native heart without angina pectoris  I25.10   3. Hypercholesteremia  E78.00   4. Primary hypertension  I10 metoprolol succinate (TOPROL-XL) 25 MG 24 hr tablet    amLODipine (NORVASC) 5 MG tablet    Medications Discontinued During This Encounter  Medication Reason  . metoprolol succinate (TOPROL-XL) 25 MG 24 hr tablet Reorder  . amLODipine (NORVASC) 5 MG tablet Reorder    Meds ordered this encounter  Medications  . metoprolol succinate (TOPROL-XL) 25 MG 24 hr tablet    Sig: Take 1 tablet (25 mg total) by mouth daily. Take with or immediately following a meal.    Dispense:  90 tablet    Refill:  3  . amLODipine (NORVASC) 5 MG tablet    Sig: TAKE 1 TABLET(5 MG) BY MOUTH DAILY    Dispense:  90 tablet    Refill:  3   No orders of the defined types were placed in this encounter.  Recommendations:   Maria Bolton is a 75 y.o. Caucasian female with hypertension, hyperglycemia, hyperlipidemia, breast cancer status post radiation therapy in 2012, morbid obesity, fatty liver, chronic diastolic heart failure who is referred back to me for evaluation of abnormal CT scan revealing significant coronary calcification.  She presents here for a 4-week follow-up visit, she is scheduled for right knee replacement by Dr. Rod Can soon.  I reviewed the results of the echocardiogram and stress test with the patient, low risk.  I have sent a letter to Dr. Rod Can that she can be taken up for the surgery with low risk from cardiac standpoint.  Her blood pressure has been very well controlled and she is tolerating low-dose beta-blocker and also amlodipine, I have refilled this prescription.  Advised her to follow-up with her  PCP for further management of her hypertension.  With regard to  her weight, she has lost 10 pounds since last office visit.  She is trying to make lifestyle changes and her husband is very supportive of her.  If she continues to lose weight we may have to discontinue amlodipine.  Otherwise stable from cardiac standpoint, I will see her back on a as needed basis.    Adrian Prows, MD, [facility] 08/27/2020, 11:55 AM Office: [phone]

## 2020-09-01 DIAGNOSIS — M1712 Unilateral primary osteoarthritis, left knee: Secondary | ICD-10-CM | POA: Diagnosis not present

## 2020-09-14 DIAGNOSIS — M1712 Unilateral primary osteoarthritis, left knee: Secondary | ICD-10-CM | POA: Diagnosis not present

## 2020-09-16 DIAGNOSIS — M1712 Unilateral primary osteoarthritis, left knee: Secondary | ICD-10-CM | POA: Diagnosis not present

## 2020-09-21 DIAGNOSIS — M1712 Unilateral primary osteoarthritis, left knee: Secondary | ICD-10-CM | POA: Diagnosis not present

## 2020-09-24 DIAGNOSIS — M1712 Unilateral primary osteoarthritis, left knee: Secondary | ICD-10-CM | POA: Diagnosis not present

## 2020-09-29 DIAGNOSIS — M1712 Unilateral primary osteoarthritis, left knee: Secondary | ICD-10-CM | POA: Diagnosis not present

## 2020-10-02 DIAGNOSIS — M1712 Unilateral primary osteoarthritis, left knee: Secondary | ICD-10-CM | POA: Diagnosis not present

## 2020-10-05 DIAGNOSIS — M1712 Unilateral primary osteoarthritis, left knee: Secondary | ICD-10-CM | POA: Diagnosis not present

## 2020-10-14 DIAGNOSIS — M1712 Unilateral primary osteoarthritis, left knee: Secondary | ICD-10-CM | POA: Diagnosis not present

## 2020-10-16 DIAGNOSIS — M7052 Other bursitis of knee, left knee: Secondary | ICD-10-CM | POA: Diagnosis not present

## 2020-10-16 DIAGNOSIS — M1712 Unilateral primary osteoarthritis, left knee: Secondary | ICD-10-CM | POA: Diagnosis not present

## 2020-10-22 DIAGNOSIS — M1712 Unilateral primary osteoarthritis, left knee: Secondary | ICD-10-CM | POA: Diagnosis not present

## 2020-10-28 DIAGNOSIS — M1712 Unilateral primary osteoarthritis, left knee: Secondary | ICD-10-CM | POA: Diagnosis not present

## 2020-11-04 DIAGNOSIS — M1712 Unilateral primary osteoarthritis, left knee: Secondary | ICD-10-CM | POA: Diagnosis not present

## 2020-11-06 DIAGNOSIS — M1712 Unilateral primary osteoarthritis, left knee: Secondary | ICD-10-CM | POA: Diagnosis not present

## 2020-11-11 DIAGNOSIS — M1712 Unilateral primary osteoarthritis, left knee: Secondary | ICD-10-CM | POA: Diagnosis not present

## 2020-11-13 DIAGNOSIS — M1712 Unilateral primary osteoarthritis, left knee: Secondary | ICD-10-CM | POA: Diagnosis not present

## 2020-11-20 DIAGNOSIS — M1712 Unilateral primary osteoarthritis, left knee: Secondary | ICD-10-CM | POA: Diagnosis not present

## 2020-11-24 DIAGNOSIS — M1712 Unilateral primary osteoarthritis, left knee: Secondary | ICD-10-CM | POA: Diagnosis not present

## 2020-12-01 DIAGNOSIS — M1712 Unilateral primary osteoarthritis, left knee: Secondary | ICD-10-CM | POA: Diagnosis not present

## 2020-12-14 ENCOUNTER — Telehealth: Payer: Self-pay

## 2020-12-14 DIAGNOSIS — I10 Essential (primary) hypertension: Secondary | ICD-10-CM | POA: Diagnosis not present

## 2020-12-14 DIAGNOSIS — E890 Postprocedural hypothyroidism: Secondary | ICD-10-CM | POA: Diagnosis not present

## 2020-12-14 DIAGNOSIS — R7309 Other abnormal glucose: Secondary | ICD-10-CM | POA: Diagnosis not present

## 2020-12-14 DIAGNOSIS — E0500 Thyrotoxicosis with diffuse goiter without thyrotoxic crisis or storm: Secondary | ICD-10-CM | POA: Diagnosis not present

## 2020-12-14 MED ORDER — DONEPEZIL HCL 10 MG PO TABS: 10.0000 mg | ORAL_TABLET | Freq: Every day | ORAL | 11 refills | Status: DC

## 2020-12-14 NOTE — Telephone Encounter (Signed)
Refill sent to pharmacy.   

## 2021-01-07 ENCOUNTER — Other Ambulatory Visit: Payer: Self-pay | Admitting: Neurology

## 2021-01-07 ENCOUNTER — Telehealth: Payer: Self-pay | Admitting: Neurology

## 2021-01-07 MED ORDER — MEMANTINE HCL 10 MG PO TABS: 10.0000 mg | ORAL_TABLET | Freq: Two times a day (BID) | ORAL | 0 refills | Status: DC

## 2021-01-07 NOTE — Telephone Encounter (Signed)
Pt request refill memantine (NAMENDA) 10 MG tablet at [place] [id number]

## 2021-01-07 NOTE — Telephone Encounter (Signed)
Pt has pending yearly follow up on 02/02/21. Refill sent to pharmacy.

## 2021-02-02 ENCOUNTER — Ambulatory Visit (INDEPENDENT_AMBULATORY_CARE_PROVIDER_SITE_OTHER): Payer: Medicare Other | Admitting: Neurology

## 2021-02-02 ENCOUNTER — Ambulatory Visit: Payer: Medicare Other | Admitting: Neurology

## 2021-02-02 VITALS — BP 123/69 | HR 66 | Ht 66.0 in | Wt 223.0 lb

## 2021-02-02 DIAGNOSIS — S80222D Blister (nonthermal), left knee, subsequent encounter: Secondary | ICD-10-CM | POA: Diagnosis not present

## 2021-02-02 DIAGNOSIS — F0390 Unspecified dementia without behavioral disturbance: Secondary | ICD-10-CM | POA: Diagnosis not present

## 2021-02-02 DIAGNOSIS — F02C0 Dementia in other diseases classified elsewhere, severe, without behavioral disturbance, psychotic disturbance, mood disturbance, and anxiety: Secondary | ICD-10-CM | POA: Insufficient documentation

## 2021-02-02 MED ORDER — MEMANTINE HCL 10 MG PO TABS: 10.0000 mg | ORAL_TABLET | Freq: Two times a day (BID) | ORAL | 4 refills | Status: DC

## 2021-02-02 MED ORDER — DONEPEZIL HCL 10 MG PO TABS: 10.0000 mg | ORAL_TABLET | Freq: Every day | ORAL | 4 refills | Status: DC

## 2021-02-02 NOTE — Progress Notes (Signed)
Chief Complaint  Patient presents with   Follow-up    New rm, with husband, states she is stable       ASSESSMENT AND PLAN  [name] is a 75 y.o. female Dementia Slowly worsening MOCA 18/30. Continue moderate exercise Continue Namenda 10 mg twice a day, Aricept 10 mg daily, Follow-up and continue refill by primary care physician  DIAGNOSTIC DATA (LABS, IMAGING, TESTING) - I reviewed patient records, labs, notes, testing and imaging myself where available.   MEDICAL HISTORY:  Maria Bolton is a 75 years old right-handed female, accompanied by her husband, seen in refer by her cardiologist Dr. Adrian Prows for evaluation of memory loss, her primary care physician is Dr. Teena Irani, initial evaluation was on September 06 2016.   I reviewed and are summarized referring note, she had a history of chronic diastolic heart failure, hyperglycemia, but not taking any medications for diabetes, COPD, hypothyroidism, vitamin D deficiency, psoriatic arthritis, left breast cancer, status post lobectomy in 2012 followed by radiation therapy, no chemotherapy is required.   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  Mother died of breast cancer at age 49, her father died at age 13, did suffer severe Alzheimer's disease.    Around 2016, she noticed she has tendency to forget peoples name, misplaced things, got confused while driving in a familiar route, She is still very active at home, does yard work, used to Technical sales engineer, in 2016, normal CBC creatinine of 0.69, potassium of 3.8, normal CMP, A1c 5.9, total cholesterol 163, LDL 88, normal TSH 0.57, vitamin D 49.9, next   Echocardiogram December 2016, left ventricular cavity is normal, moderate concentric hypertrophy of the left ventricle, normal global Luckenbaugh motion, ejection fraction [id number], grade 1 diastolic dysfunction.   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   She continue struggle with short-term memory loss, Mini-Mental Status is 27/30, missed 3 out of 3 recall,   We have personally reviewed MRI of the brain in April 2018: Generalized atrophy, mild supratentorium small vessel disease   UPDATE May 02 2017: I have reviewed and summarized note from her endocrinologist Dr. Chalmers Cater, Mindi Curling dated November 25 2016, hyperthyroidism, anxiety, thyrotoxicosis without thyroid toxic crisis or storm, she was treated for radioactive iodine on December 01 2016, treatment of toxic multinodular goiter   She is tolerating Aricept 10 mg every day, Namenda 10 mg twice a day, her husband reported mild worsening memory loss,   I reviewed the laboratory evaluations in October 2018, cholesterol 156, LDL 68, normal TSH, vitamin D was 49, hemoglobin was 13.3, creatinine was 0.72,  UPDATE February 02 2021: She is doing well, tolerating Namenda 10 mg twice a day, Aricept 10 mg daily, continue slow worsening memory loss, still active, keep a daily routine   PHYSICAL EXAM:   Vitals:   02/02/21 1327  BP: 123/69  Pulse: 66  Weight: 223 lb (101.2 kg)  Height: '5\' 6"'$  (1.676 m)   Not recorded     Body mass index is 35.99 kg/m.  PHYSICAL EXAMNIATION:  Gen: NAD, conversant, well nourised, well groomed                     Cardiovascular: Regular rate rhythm, no peripheral edema, warm, nontender. Eyes: Conjunctivae clear without exudates or hemorrhage Neck: Supple, no carotid bruits. Pulmonary: Clear to auscultation bilaterally   NEUROLOGICAL EXAM:  MENTAL  STATUS: Speech:    Speech is normal; fluent and spontaneous with normal comprehension.  Cognition:     Montreal Cognitive Assessment  02/02/2021  Visuospatial/ Executive (0/5) 4  Naming (0/3) 2  Attention: Read list of digits (0/2) 2  Attention: Read list of letters (0/1) 1  Attention: Serial 7 subtraction starting at 100 (0/3) 3  Language: Repeat phrase (0/2) 2  Language : Fluency (0/1) 0  Abstraction (0/2) 2  Delayed Recall (0/5) 0  Orientation (0/6) 2  Total 18    CRANIAL NERVES: CN II: Visual fields are full to confrontation. Pupils are round equal and briskly reactive to light. CN III, IV, VI: extraocular movement are normal. No ptosis. CN V: Facial sensation is intact to light touch CN VII: Face is symmetric with normal eye closure  CN VIII: Hearing is normal to causal conversation. CN IX, X: Phonation is normal. CN XI: Head turning and shoulder shrug are intact  MOTOR: There is no pronator drift of out-stretched arms. Muscle bulk and tone are normal. Muscle strength is normal.  REFLEXES: Reflexes are 2+ and symmetric at the biceps, triceps, knees, and ankles. Plantar responses are flexor.  SENSORY: Intact to light touch, pinprick and vibratory sensation are intact in fingers and toes.  COORDINATION: There is no trunk or limb dysmetria noted.  GAIT/STANCE: Posture is normal. Gait is steady with normal steps, base, arm swing, and turning. Heel and toe walking are normal. Tandem gait is normal.  Romberg is absent.  REVIEW OF SYSTEMS:  Full 14 system review of systems performed and notable only for as above All other review of systems were negative.   ALLERGIES: No Known Allergies  HOME MEDICATIONS: Current Outpatient Medications  Medication Sig Dispense Refill   albuterol (VENTOLIN HFA) 108 (90 Base) MCG/ACT inhaler 2 puffs as needed     amLODipine (NORVASC) 5 MG tablet TAKE 1 TABLET(5 MG) BY MOUTH DAILY 90 tablet 3   cholecalciferol (VITAMIN D) 1000 units  tablet Take 1,000 Units by mouth daily.     donepezil (ARICEPT) 10 MG tablet Take 1 tablet (10 mg total) by mouth daily. 30 tablet 11   levothyroxine (SYNTHROID) 25 MCG tablet Take 25 mcg by mouth every morning.     losartan (COZAAR) 100 MG tablet Take 100 mg by mouth daily.     memantine (NAMENDA) 10 MG tablet TAKE 1 TABLET(10 MG) BY MOUTH TWICE DAILY 180 tablet 0   metoprolol succinate (TOPROL-XL) 25 MG 24 hr tablet Take 1 tablet (25 mg total) by mouth daily. Take with or immediately following a meal. 90 tablet 3   rosuvastatin (CRESTOR) 10 MG tablet Take 10 mg by mouth daily.     No current facility-administered medications for this visit.    PAST MEDICAL HISTORY: Past Medical History:  Diagnosis Date   Breast cancer ([place]) 04/2011   DCIS left breast , ER/PR+   Difficulty sleeping    since dx of cancer   Hypertension 05/11/2011   Memory loss     PAST SURGICAL HISTORY: Past Surgical History:  Procedure Laterality Date   BREAST LUMPECTOMY  05/16/2011   Procedure: LUMPECTOMY;  Surgeon: Shann Medal, MD;  Location: [facility];  Service: General;  Laterality: Left;  Needle localization Left breast lumpectomy   [place]  05/16/2011   Procedure: NEVUS EXCISION;  Surgeon: Shann Medal, MD;  Location: [facility];  Service: General;  Laterality: Left;  remove mole left breast    FAMILY HISTORY: Family History  Problem Relation Age of Onset   Cancer Mother 68       breast ca   Alzheimer's disease Father    Cancer Maternal Aunt        breast   Cancer Maternal Uncle        breast   Cancer Cousin        breast    SOCIAL HISTORY: Social History   Socioeconomic History   Marital status: Married    Spouse name: Not on file   Number of children: 1   Years of education: HS   Highest education level: Not on file  Occupational History   Occupation: Retired  Tobacco Use   Smoking status: Former    Packs/day: 1.00     Years: 46.00    Pack years: 46.00    Types: Cigarettes    Quit date: 11/04/2009    Years since quitting: 11.2   Smokeless tobacco: Never  Vaping Use   Vaping Use: Never used  Substance and Sexual Activity   Alcohol use: No   Drug use: No   Sexual activity: Yes  Other Topics Concern   Not on file  Social History Narrative   Lives at home with husband.   Right-handed.   No caffeine use.   Social Determinants of Health   Financial Resource Strain: Not on file  Food Insecurity: Not on file  Transportation Needs: Not on file  Physical Activity: Not on file  Stress: Not on file  Social Connections: Not on file  Intimate Partner Violence: Not on file      Marcial Pacas, M.D. Ph.D.  [facility] [address], [place], [place] [address] Ph: [phone] Fax: [phone]  CC:  [name], MD [place],  [place] [address]  [name], MD

## 2021-03-05 DIAGNOSIS — M1990 Unspecified osteoarthritis, unspecified site: Secondary | ICD-10-CM | POA: Diagnosis not present

## 2021-03-05 DIAGNOSIS — N182 Chronic kidney disease, stage 2 (mild): Secondary | ICD-10-CM | POA: Diagnosis not present

## 2021-03-05 DIAGNOSIS — I130 Hypertensive heart and chronic kidney disease with heart failure and stage 1 through stage 4 chronic kidney disease, or unspecified chronic kidney disease: Secondary | ICD-10-CM | POA: Diagnosis not present

## 2021-03-05 DIAGNOSIS — I5032 Chronic diastolic (congestive) heart failure: Secondary | ICD-10-CM | POA: Diagnosis not present

## 2021-03-08 DIAGNOSIS — M1712 Unilateral primary osteoarthritis, left knee: Secondary | ICD-10-CM | POA: Diagnosis not present

## 2021-03-19 ENCOUNTER — Encounter: Payer: Self-pay | Admitting: Adult Health

## 2021-03-19 ENCOUNTER — Telehealth: Payer: Self-pay | Admitting: *Deleted

## 2021-03-19 ENCOUNTER — Other Ambulatory Visit: Payer: Self-pay

## 2021-03-19 ENCOUNTER — Inpatient Hospital Stay: Payer: Medicare Other | Attending: Adult Health | Admitting: Adult Health

## 2021-03-19 ENCOUNTER — Other Ambulatory Visit (HOSPITAL_COMMUNITY): Payer: Self-pay

## 2021-03-19 VITALS — BP 133/74 | HR 70 | Temp 97.4°F | Resp 18 | Ht 66.0 in | Wt 220.6 lb

## 2021-03-19 DIAGNOSIS — Z79899 Other long term (current) drug therapy: Secondary | ICD-10-CM | POA: Diagnosis not present

## 2021-03-19 DIAGNOSIS — I10 Essential (primary) hypertension: Secondary | ICD-10-CM | POA: Insufficient documentation

## 2021-03-19 DIAGNOSIS — Z923 Personal history of irradiation: Secondary | ICD-10-CM | POA: Diagnosis not present

## 2021-03-19 DIAGNOSIS — Z818 Family history of other mental and behavioral disorders: Secondary | ICD-10-CM | POA: Insufficient documentation

## 2021-03-19 DIAGNOSIS — Z803 Family history of malignant neoplasm of breast: Secondary | ICD-10-CM | POA: Insufficient documentation

## 2021-03-19 DIAGNOSIS — Z87891 Personal history of nicotine dependence: Secondary | ICD-10-CM | POA: Insufficient documentation

## 2021-03-19 DIAGNOSIS — D0512 Intraductal carcinoma in situ of left breast: Secondary | ICD-10-CM | POA: Diagnosis not present

## 2021-03-19 MED ORDER — ZOSTER VAC RECOMB ADJUVANTED 50 MCG/0.5ML IM SUSR
0.5000 mL | INTRAMUSCULAR | 1 refills | Status: DC
  Filled 2021-03-19 – 2021-04-06 (×2): qty 0.5, 1d supply, fill #0
  Filled 2021-06-08 (×2): qty 0.5, 1d supply, fill #1

## 2021-03-19 NOTE — Progress Notes (Signed)
CLINIC:  Survivorship   REASON FOR VISIT:  Routine follow-up for history of breast cancer.   BRIEF ONCOLOGIC HISTORY:   1.  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  Adjuvant radiation therapy by Dr. Valere Dross   3.  Genetic testing negative for BRCA1 and 2    4.  Tamoxifen 20 mg started 09/27/2011-09/2017  INTERVAL HISTORY:  Ms. Schwanz presents to the [facility] today for routine follow-up for her history of breast cancer. She notes that she is doing well today.  She notes that she underwent mammogram that was normal, however we do not have this in our system, and the server for [facility] system is down.    She continues to undergo lung cancer screening annually and f/u with her PCP Dr. Shelia Media.  She notes that she does not plan on undergoing colonoscopy, however she says that she cannot receive the shingles vaccine due to a copay of $100.  She notes that she reamins active in her home and denies any new issues or breast concerns today.     REVIEW OF SYSTEMS:  Review of Systems  Constitutional:  Negative for appetite change, chills, fatigue, fever and unexpected weight change.  HENT:   Negative for hearing loss, lump/mass and sore throat.   Eyes:  Negative for eye problems and icterus.  Respiratory:  Negative for chest tightness, cough and shortness of breath.   Cardiovascular:  Negative for chest pain, leg swelling and palpitations.  Gastrointestinal:  Negative for abdominal distention, abdominal pain, constipation, diarrhea, nausea and vomiting.  Endocrine: Negative for hot flashes.  Genitourinary:  Negative for difficulty urinating.   Musculoskeletal:  Negative for arthralgias.  Skin:  Negative for itching and rash.  Neurological:  Negative for dizziness, extremity weakness, headaches and numbness.  Hematological:  Negative for adenopathy. Does not bruise/bleed easily.  Psychiatric/Behavioral:  Negative for  depression. The patient is not nervous/anxious.  Breast: Denies any new nodularity, masses, tenderness, nipple changes, or nipple discharge.       PAST MEDICAL/SURGICAL HISTORY:  Past Medical History:  Diagnosis Date   Breast cancer ([place]) 04/2011   DCIS left breast , ER/PR+   Difficulty sleeping    since dx of cancer   Hypertension 05/11/2011   Memory loss    Past Surgical History:  Procedure Laterality Date   BREAST LUMPECTOMY  05/16/2011   Procedure: LUMPECTOMY;  Surgeon: Shann Medal, MD;  Location: [facility];  Service: General;  Laterality: Left;  Needle localization Left breast lumpectomy   [place]  05/16/2011   Procedure: NEVUS EXCISION;  Surgeon: Shann Medal, MD;  Location: [facility];  Service: General;  Laterality: Left;  remove mole left breast     ALLERGIES:  No Known Allergies   CURRENT MEDICATIONS:  Outpatient Encounter Medications as of 03/19/2021  Medication Sig   albuterol (VENTOLIN HFA) 108 (90 Base) MCG/ACT inhaler 2 puffs as needed   amLODipine (NORVASC) 5 MG tablet TAKE 1 TABLET(5 MG) BY MOUTH DAILY   cholecalciferol (VITAMIN D) 1000 units tablet Take 1,000 Units by mouth daily.   donepezil (ARICEPT) 10 MG tablet Take 1 tablet (10 mg total) by mouth daily.   levothyroxine (SYNTHROID) 25 MCG tablet Take 25 mcg by mouth every morning.   losartan (COZAAR) 100 MG tablet Take 100 mg by mouth daily.   meloxicam (MOBIC) 15 MG tablet  Take 15 mg by mouth daily.   memantine (NAMENDA) 10 MG tablet Take 1 tablet (10 mg total) by mouth 2 (two) times daily.   metoprolol succinate (TOPROL-XL) 25 MG 24 hr tablet Take 1 tablet (25 mg total) by mouth daily. Take with or immediately following a meal.   rosuvastatin (CRESTOR) 10 MG tablet Take 10 mg by mouth daily.   No facility-administered encounter medications on file as of 03/19/2021.     ONCOLOGIC FAMILY HISTORY:  Family History  Problem Relation Age  of Onset   Cancer Mother 69       breast ca   Alzheimer's disease Father    Cancer Maternal Aunt        breast   Cancer Maternal Uncle        breast   Cancer Cousin        breast      SOCIAL HISTORY:  Social History   Socioeconomic History   Marital status: Married    Spouse name: Not on file   Number of children: 1   Years of education: HS   Highest education level: Not on file  Occupational History   Occupation: Retired  Tobacco Use   Smoking status: Former    Packs/day: 1.00    Years: 46.00    Pack years: 46.00    Types: Cigarettes    Quit date: 11/04/2009    Years since quitting: 11.3   Smokeless tobacco: Never  Vaping Use   Vaping Use: Never used  Substance and Sexual Activity   Alcohol use: No   Drug use: No   Sexual activity: Yes  Other Topics Concern   Not on file  Social History Narrative   Lives at home with husband.   Right-handed.   No caffeine use.   Social Determinants of Health   Financial Resource Strain: Not on file  Food Insecurity: Not on file  Transportation Needs: Not on file  Physical Activity: Not on file  Stress: Not on file  Social Connections: Not on file  Intimate Partner Violence: Not on file        PHYSICAL EXAMINATION:  Vital Signs: Vitals:   03/19/21 0913  BP: 133/74  Pulse: 70  Resp: 18  Temp: (!) 97.4 F (36.3 C)  SpO2: 100%   Filed Weights   03/19/21 0913  Weight: 220 lb 9.6 oz (100.1 kg)   General: Well-nourished, well-appearing female in no acute distress.  Unaccompanied today.   HEENT: Head is normocephalic.  Pupils equal and reactive to light. Conjunctivae clear without exudate.  Sclerae anicteric. Mask in place.  Lymph: No cervical, supraclavicular, or infraclavicular lymphadenopathy noted on palpation.  Cardiovascular: Regular rate and rhythm.Marland Kitchen Respiratory: Clear to auscultation bilaterally. Chest expansion symmetric; breathing non-labored.  Breast Exam:  -Left breast: No appreciable masses on  palpation. No skin redness, thickening, or peau d'orange appearance; no nipple retraction or nipple discharge; mild distortion in symmetry at previous lumpectomy site well healed scar without erythema or nodularity.  -Right breast: No appreciable masses on palpation. No skin redness, thickening, or peau d'orange appearance; no nipple retraction or nipple discharge; Fungal rash noted underneath bilateral breasts -Axilla: No axillary adenopathy bilaterally.  GI: Abdomen soft and round; non-tender, non-distended. Bowel sounds normoactive. No hepatosplenomegaly.   GU: Deferred.  Neuro: No focal deficits. Steady gait.  Psych: Mood and affect normal and appropriate for situation.  MSK: No focal spinal tenderness to palpation, full range of motion in bilateral upper extremities Extremities: No edema. Skin:  Warm and dry.  LABORATORY DATA:  None for this visit   DIAGNOSTIC IMAGING:  Most recent mammogram: normal 08/10/2019    ASSESSMENT AND PLAN:  Ms.. Yankee is a pleasant 75 y.o. female with history of Stage 0 left breast DCIS, ER+, diagnosed in 04/2011, treated with lumpectomy, adjuvant radiation therapy, and anti-estrogen therapy with Tamoxifen therapy x 5 years completed in 09/2016.  She [facility] for surveillance and routine follow-up.   1. History of breast cancer:  Ms. Heather is currently clinically and radiographically without evidence of disease or recurrence of breast cancer. She will be due for mammogram in 08/2021.   She will return in one year for LTS follow up.  I encouraged her to call me with any questions or concerns before her next visit at the cancer center, and I would be happy to see her sooner, if needed.    2. Bone health:  Given Ms. Drumwright's age, history of breast cancer, she is at risk for bone demineralization.  She was given education on specific food and activities to promote bone health.  3. Cancer screening:  Due to Ms. [place] history and her age, she  should receive screening for skin cancers, colon cancer, lung cancer, and gynecologic cancers. She was encouraged to follow-up with her PCP for appropriate cancer screenings. I discussed colon cancer screening with her today.  She says that she does not want to undergo colonoscopy, and I encouraged her to talk to her PCP about cologuard.  She understands this.  I also had [facility] run the cost of the shingles vaccine.  It is $47.  I gave Biannca this information in addition the pharmacy phone number and address.    4. Health maintenance and wellness promotion: Ms. Zech was encouraged to consume 5-7 servings of fruits and vegetables per day. She was also encouraged to engage in moderate to vigorous exercise for 30 minutes per day most days of the week. She was instructed to limit her alcohol consumption and continue to abstain from tobacco use.   Dispo:  -Return to cancer center in one year for LTS follow up -Mammogram due in 08/2021  Total encounter time: 20 minutes* in face to face visit time, chart review, lab review, order entry, and documentation of the encounter.    Wilber Bihari, NP 03/19/21 9:31 AM Medical Oncology and Hematology [facility] [place], [place] [address] Tel. [phone]    Fax. [phone]  *Total Encounter Time as defined by the Centers for Medicare and Medicaid Services includes, in addition to the face-to-face time of a patient visit (documented in the note above) non-face-to-face time: obtaining and reviewing outside history, ordering and reviewing medications, tests or procedures, care coordination (communications with other health care professionals or caregivers) and documentation in the medical record.   Note: PRIMARY CARE PROVIDER [name], [place] [phone]

## 2021-03-19 NOTE — Telephone Encounter (Signed)
Called & left message with Medical Records at Dr Pennie Banter office to fax [place] Dexa Scan report.

## 2021-03-29 DIAGNOSIS — G894 Chronic pain syndrome: Secondary | ICD-10-CM | POA: Diagnosis not present

## 2021-04-02 ENCOUNTER — Other Ambulatory Visit (HOSPITAL_COMMUNITY): Payer: Self-pay

## 2021-04-05 DIAGNOSIS — M1990 Unspecified osteoarthritis, unspecified site: Secondary | ICD-10-CM | POA: Diagnosis not present

## 2021-04-05 DIAGNOSIS — I5032 Chronic diastolic (congestive) heart failure: Secondary | ICD-10-CM | POA: Diagnosis not present

## 2021-04-05 DIAGNOSIS — N182 Chronic kidney disease, stage 2 (mild): Secondary | ICD-10-CM | POA: Diagnosis not present

## 2021-04-05 DIAGNOSIS — I130 Hypertensive heart and chronic kidney disease with heart failure and stage 1 through stage 4 chronic kidney disease, or unspecified chronic kidney disease: Secondary | ICD-10-CM | POA: Diagnosis not present

## 2021-04-06 ENCOUNTER — Other Ambulatory Visit (HOSPITAL_COMMUNITY): Payer: Self-pay

## 2021-04-26 ENCOUNTER — Other Ambulatory Visit (HOSPITAL_COMMUNITY): Payer: Self-pay

## 2021-05-03 DIAGNOSIS — R7309 Other abnormal glucose: Secondary | ICD-10-CM | POA: Diagnosis not present

## 2021-05-03 DIAGNOSIS — E890 Postprocedural hypothyroidism: Secondary | ICD-10-CM | POA: Diagnosis not present

## 2021-05-03 DIAGNOSIS — I129 Hypertensive chronic kidney disease with stage 1 through stage 4 chronic kidney disease, or unspecified chronic kidney disease: Secondary | ICD-10-CM | POA: Diagnosis not present

## 2021-05-05 DIAGNOSIS — M1712 Unilateral primary osteoarthritis, left knee: Secondary | ICD-10-CM | POA: Diagnosis not present

## 2021-05-07 DIAGNOSIS — M1712 Unilateral primary osteoarthritis, left knee: Secondary | ICD-10-CM | POA: Diagnosis not present

## 2021-05-07 DIAGNOSIS — Z0000 Encounter for general adult medical examination without abnormal findings: Secondary | ICD-10-CM | POA: Diagnosis not present

## 2021-05-07 DIAGNOSIS — I2510 Atherosclerotic heart disease of native coronary artery without angina pectoris: Secondary | ICD-10-CM | POA: Diagnosis not present

## 2021-05-07 DIAGNOSIS — E890 Postprocedural hypothyroidism: Secondary | ICD-10-CM | POA: Diagnosis not present

## 2021-05-07 DIAGNOSIS — Z780 Asymptomatic menopausal state: Secondary | ICD-10-CM | POA: Diagnosis not present

## 2021-05-07 DIAGNOSIS — I5032 Chronic diastolic (congestive) heart failure: Secondary | ICD-10-CM | POA: Diagnosis not present

## 2021-05-07 DIAGNOSIS — R911 Solitary pulmonary nodule: Secondary | ICD-10-CM | POA: Diagnosis not present

## 2021-05-07 DIAGNOSIS — I739 Peripheral vascular disease, unspecified: Secondary | ICD-10-CM | POA: Diagnosis not present

## 2021-05-20 DIAGNOSIS — H5213 Myopia, bilateral: Secondary | ICD-10-CM | POA: Diagnosis not present

## 2021-05-20 DIAGNOSIS — H353131 Nonexudative age-related macular degeneration, bilateral, early dry stage: Secondary | ICD-10-CM | POA: Diagnosis not present

## 2021-05-20 DIAGNOSIS — H2513 Age-related nuclear cataract, bilateral: Secondary | ICD-10-CM | POA: Diagnosis not present

## 2021-06-03 DIAGNOSIS — M25561 Pain in right knee: Secondary | ICD-10-CM | POA: Diagnosis not present

## 2021-06-03 DIAGNOSIS — M1712 Unilateral primary osteoarthritis, left knee: Secondary | ICD-10-CM | POA: Diagnosis not present

## 2021-06-08 ENCOUNTER — Other Ambulatory Visit (HOSPITAL_COMMUNITY): Payer: Self-pay

## 2021-06-09 ENCOUNTER — Other Ambulatory Visit (HOSPITAL_COMMUNITY): Payer: Self-pay

## 2021-07-21 ENCOUNTER — Other Ambulatory Visit: Payer: Self-pay | Admitting: Internal Medicine

## 2021-07-21 DIAGNOSIS — R911 Solitary pulmonary nodule: Secondary | ICD-10-CM

## 2021-08-12 ENCOUNTER — Other Ambulatory Visit: Payer: Self-pay | Admitting: *Deleted

## 2021-08-12 DIAGNOSIS — Z87891 Personal history of nicotine dependence: Secondary | ICD-10-CM

## 2021-08-24 ENCOUNTER — Ambulatory Visit (INDEPENDENT_AMBULATORY_CARE_PROVIDER_SITE_OTHER)
Admission: RE | Admit: 2021-08-24 | Discharge: 2021-08-24 | Disposition: A | Discharge status: Home / Self Care | Payer: Medicare Other | Source: Ambulatory Visit | Attending: Acute Care | Admitting: Acute Care

## 2021-08-24 ENCOUNTER — Other Ambulatory Visit: Payer: Self-pay

## 2021-08-24 DIAGNOSIS — Z87891 Personal history of nicotine dependence: Secondary | ICD-10-CM | POA: Diagnosis not present

## 2021-08-26 ENCOUNTER — Other Ambulatory Visit: Payer: Self-pay

## 2021-08-26 DIAGNOSIS — Z87891 Personal history of nicotine dependence: Secondary | ICD-10-CM

## 2021-10-06 ENCOUNTER — Other Ambulatory Visit: Payer: Self-pay

## 2021-10-14 ENCOUNTER — Other Ambulatory Visit: Payer: Self-pay | Admitting: General Surgery

## 2021-10-14 DIAGNOSIS — Z853 Personal history of malignant neoplasm of breast: Secondary | ICD-10-CM | POA: Insufficient documentation

## 2021-10-14 DIAGNOSIS — Z170 Estrogen receptor positive status [ER+]: Secondary | ICD-10-CM

## 2021-10-15 ENCOUNTER — Other Ambulatory Visit: Payer: Self-pay | Admitting: General Surgery

## 2021-10-18 ENCOUNTER — Other Ambulatory Visit: Payer: Self-pay | Admitting: *Deleted

## 2021-10-18 DIAGNOSIS — Z170 Estrogen receptor positive status [ER+]: Secondary | ICD-10-CM

## 2021-10-19 ENCOUNTER — Inpatient Hospital Stay: Payer: Medicare Other | Attending: Hematology and Oncology | Admitting: Hematology and Oncology

## 2021-10-19 ENCOUNTER — Ambulatory Visit: Payer: Medicare Other | Attending: Hematology and Oncology | Admitting: Rehabilitation

## 2021-10-19 ENCOUNTER — Encounter: Payer: Self-pay | Admitting: Rehabilitation

## 2021-10-19 ENCOUNTER — Other Ambulatory Visit: Payer: Self-pay

## 2021-10-19 ENCOUNTER — Encounter (HOSPITAL_BASED_OUTPATIENT_CLINIC_OR_DEPARTMENT_OTHER): Payer: Self-pay | Admitting: General Surgery

## 2021-10-19 DIAGNOSIS — Z170 Estrogen receptor positive status [ER+]: Secondary | ICD-10-CM | POA: Diagnosis present

## 2021-10-19 DIAGNOSIS — R293 Abnormal posture: Secondary | ICD-10-CM | POA: Diagnosis present

## 2021-10-19 DIAGNOSIS — Z79899 Other long term (current) drug therapy: Secondary | ICD-10-CM | POA: Insufficient documentation

## 2021-10-19 DIAGNOSIS — D0512 Intraductal carcinoma in situ of left breast: Secondary | ICD-10-CM | POA: Insufficient documentation

## 2021-10-19 DIAGNOSIS — C50312 Malignant neoplasm of lower-inner quadrant of left female breast: Secondary | ICD-10-CM | POA: Insufficient documentation

## 2021-10-19 DIAGNOSIS — Z79810 Long term (current) use of selective estrogen receptor modulators (SERMs): Secondary | ICD-10-CM | POA: Insufficient documentation

## 2021-10-19 NOTE — Therapy (Signed)
?OUTPATIENT PHYSICAL THERAPY BREAST CANCER BASELINE EVALUATION ? ? ?Patient Name: Maria Bolton ?MRN: [id number] ?DOB:21-Feb-1946, 76 y.o., female ?Today's Date: 10/19/2021 ? ? PT End of Session - 10/19/21 1412   ? ? Visit Number 1   ? Number of Visits 2   ? Date for PT Re-Evaluation 11/16/21   ? PT Start Time 1408   ? PT Stop Time 1440   ? PT Time Calculation (min) 32 min   ? Activity Tolerance Patient tolerated treatment well   ? Behavior During Therapy [facility] for tasks assessed/performed   ? ?  ?  ? ?  ? ? ?Past Medical History:  ?Diagnosis Date  ? Breast cancer ([place]) 04/2011  ? DCIS left breast , ER/PR+  ? Chronic diastolic heart failure ([place])   ? Difficulty sleeping   ? since dx of cancer  ? Hyperlipidemia   ? Hypertension 05/11/2011  ? Memory loss   ? ?Past Surgical History:  ?Procedure Laterality Date  ? BREAST LUMPECTOMY  05/16/2011  ? Procedure: LUMPECTOMY;  Surgeon: Shann Medal, MD;  Location: [place];  Service: General;  Laterality: Left;  Needle localization Left breast lumpectomy  ? [place]  ? NEVUS EXCISION  05/16/2011  ? Procedure: NEVUS EXCISION;  Surgeon: Shann Medal, MD;  Location: [place];  Service: General;  Laterality: Left;  remove mole left breast  ? ?Patient Active Problem List  ? Diagnosis Date Noted  ? Malignant neoplasm of lower-inner quadrant of left breast in female, estrogen receptor positive ([place]) 10/18/2021  ? Dementia without behavioral disturbance ([place]) 02/02/2021  ? Abnormal thyroid blood test 12/27/2016  ? Abnormal glucose 09/06/2016  ? Mild cognitive impairment 09/06/2016  ? Fever 09/19/2015  ? Acute bronchitis 09/19/2015  ? Anxiety about health 09/19/2015  ? 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  ? Hypertension 05/11/2011  ? ? ?PCP: [name], MD ? ?REFERRING PROVIDER: Dr. [name] ? ?REFERRING DIAG: C50.312,Z17.0 (ICD-10-CM) - Malignant neoplasm of lower-inner quadrant of left breast in female, estrogen receptor  positive ([place] ? ?THERAPY DIAG:  ?Malignant neoplasm of lower-inner quadrant of left breast in female, estrogen receptor positive ([place]) ? ?Abnormal posture ? ?ONSET DATE: 10/04/21 ? ?SUBJECTIVE                                                                                                                                                                                          ? ?SUBJECTIVE STATEMENT: ?Patient reports she is here today to be seen by her medical team for her newly diagnosed left breast cancer. No shoulder problems ? ?PERTINENT HISTORY:  ?  Patient was diagnosed with left breast cancer with negative axilla.  ER/PR positive HER2 pending.  Hx of breast cancer on the same side in 2012 treated with lumpectomy, radiation, and endrocrine therapy.  ? ?PATIENT GOALS   reduce lymphedema risk and learn post op HEP.  ? ?PAIN:  ?Are you having pain? No ? ? ?PRECAUTIONS: Active CA  ? ?HAND DOMINANCE: right ? ?WEIGHT BEARING RESTRICTIONS No ? ?FALLS:  ?Has patient fallen in last 6 months? Yes. Number of falls 4-5 ? ?LIVING ENVIRONMENT: ?Patient lives with: with spouse  ?Lives in: House/apartment ?Has following equipment at home: Single point cane ? ?OCCUPATION: retired  ? ?LEISURE: pulling weeds, mows the yard  ? ?PRIOR LEVEL OF FUNCTION: Independent ? ? ?OBJECTIVE ? ?COGNITION: ? Overall cognitive status: impaired; dementia - does not remember reasons for visit throughout and that surgery is taking place, etc.  ? ?POSTURE:  ?Forward head and rounded shoulders posture ? ?UPPER EXTREMITY AROM/PROM: ? ?A/PROM RIGHT  10/19/2021 ?  ?Shoulder extension   ?Shoulder flexion   ?Shoulder abduction   ?Shoulder internal rotation   ?Shoulder external rotation   ?  (Blank rows = not tested) ? ?A/PROM LEFT  10/19/2021  ?Shoulder extension 50  ?Shoulder flexion 150  ?Shoulder abduction 161  ?Shoulder internal rotation   ?Shoulder external rotation 85  ?  (Blank rows = not tested) ? ? ?CERVICAL AROM: ?All within normal limits:   ? ?LYMPHEDEMA ASSESSMENTS:  ? ?[place] RIGHT  10/19/2021  ?10 cm proximal to olecranon process   ?Olecranon process   ?10 cm proximal to ulnar styloid process   ?Just proximal to ulnar styloid process   ?Across hand at thumb web space   ?At base of 2nd digit   ?(Blank rows = not tested) ? ?[place] LEFT  10/19/2021  ?10 cm proximal to olecranon process 39.5  ?Olecranon process 30.0  ?10 cm proximal to ulnar styloid process 25.3  ?Just proximal to ulnar styloid process 18.7  ?Across hand at thumb web space 20  ?At base of 2nd digit 6.7  ?(Blank rows = not tested) ? ? ?L-DEX LYMPHEDEMA SCREENING: ? ?The patient was assessed using the L-Dex machine today to produce a lymphedema index baseline score. The patient will be reassessed on a regular basis (typically every 3 months) to obtain new L-Dex scores. If the score is > 6.5 points away from his/her baseline score indicating onset of subclinical lymphedema, it will be recommended to wear a compression garment for 4 weeks, 12 hours per day and then be reassessed. If the score continues to be > 6.5 points from baseline at reassessment, we will initiate lymphedema treatment. Assessing in this manner has a 95% rate of preventing clinically significant lymphedema. ? ? ? ? ?QUICK DASH SURVEY:11% ? ? ?PATIENT EDUCATION:  ?Education details: Lymphedema risk reduction and post op shoulder/posture HEP ?Person educated: Patient ?Education method: Explanation, Demonstration, Handout ?Education comprehension: Patient verbalized understanding and returned demonstration ? ? ?HOME EXERCISE PROGRAM: ?Patient was instructed today in a home exercise program today for post op shoulder range of motion. These included active assist shoulder flexion in sitting, scapular retraction, [place] walking with shoulder abduction, and hands behind head external rotation.  She was encouraged to do these twice a day, holding 3 seconds and repeating 5 times when permitted by her  physician. ? ? ?ASSESSMENT: ? ?CLINICAL IMPRESSION: ?Pt with dementia so care instruction mainly given to spouse throughout visit. Pt will benefit from a post op PT reassessment to determine  needs and from L-Dex screens every 3 months for 2 years to detect subclinical lymphedema. ? ?Pt will benefit from skilled therapeutic intervention to improve on the following deficits: Decreased knowledge of precautions, impaired UE functional use, pain, decreased ROM, postural dysfunction.  ? ?PT treatment/interventions: ADL/self-care home management, pt/family education, therapeutic exercise ? ?REHAB POTENTIAL: Good ? ?CLINICAL DECISION MAKING: Stable/uncomplicated ? ?EVALUATION COMPLEXITY: Low ? ? ?GOALS: ?Goals reviewed with patient? YES ? ?LONG TERM GOALS: (STG=LTG) ? ? Name Target Date Goal status  ?1 Pt will be able to verbalize understanding of pertinent lymphedema risk reduction practices relevant to her dx specifically related to skin care.  ?Baseline:  No knowledge 10/19/2021 Achieved at eval  ?2 Pt will be able to return demo and/or verbalize understanding of the post op HEP related to regaining shoulder ROM. ?Baseline:  No knowledge 10/19/2021 Achieved at eval  ?3 Pt will be able to verbalize understanding of the importance of attending the post op After Breast CA Class for further lymphedema risk reduction education and therapeutic exercise.  ?Baseline:  No knowledge 10/19/2021 Achieved at eval  ?4 Pt will demo she has regained full shoulder ROM and function post operatively compared to baselines.  ?Baseline: See objective measurements taken today. 11/16/21   ? ? ? ?PLAN: ?PT FREQUENCY/DURATION: EVAL and 1 follow up appointment.  ? ?PLAN FOR NEXT SESSION: will reassess 3-4 weeks post op to determine needs. - schedule [facility] ?  ?Patient will follow up at outpatient cancer rehab 3-4 weeks following surgery.  If the patient requires physical therapy at that time, a specific plan will be dictated and sent to the referring  physician for approval. ?The patient was educated today on appropriate basic range of motion exercises to begin post operatively and the importance of attending the After Breast Cancer class following surgery.  Patient was educated today on l

## 2021-10-19 NOTE — Assessment & Plan Note (Deleted)
05/16/2011: Left DCIS status postlumpectomy: Intermediate grade, 0.17 cm, ER 99%, Tis N0 status post radiation and tamoxifen completed April 2019 10/06/2021: Screening mammogram detected 0.5 cm left breast mass LIQ: Pleomorphic ILC grade 3, ER 100%, PR 50%, Ki-67 15%, HER2 2+ by IHC, FISH HER2 positive ratio 3.4, copy #5.95  Pathology and radiology counseling: Discussed with the patient, the details of pathology including the type of breast cancer,the clinical staging, the significance of ER, PR and HER-2/neu receptors and the implications for treatment. After reviewing the pathology in detail, we proceeded to discuss the different treatment options between surgery, radiation, chemotherapy, antiestrogen therapies.  Treatment plan: 1.  Breast conserving surgery (we discussed that the standard of care would be a mastectomy.  However breast conserving surgery can be still reasonable) 2. adjuvant Taxol Herceptin if the final tumor size is greater than 0.5 cm 3.  Adjuvant antiestrogen therapy  Return to clinic after surgery to discuss final pathology report.

## 2021-10-20 ENCOUNTER — Telehealth: Payer: Self-pay | Admitting: Hematology and Oncology

## 2021-10-20 NOTE — Telephone Encounter (Signed)
.  Called patient to schedule appointment per 5/15 inbasket, patient is aware of date and time.   ?

## 2021-10-21 ENCOUNTER — Inpatient Hospital Stay: Payer: Medicare Other | Admitting: Hematology and Oncology

## 2021-10-21 ENCOUNTER — Encounter: Payer: Self-pay | Admitting: *Deleted

## 2021-10-21 ENCOUNTER — Other Ambulatory Visit: Payer: Self-pay

## 2021-10-21 DIAGNOSIS — D0512 Intraductal carcinoma in situ of left breast: Secondary | ICD-10-CM | POA: Diagnosis present

## 2021-10-21 DIAGNOSIS — Z79810 Long term (current) use of selective estrogen receptor modulators (SERMs): Secondary | ICD-10-CM | POA: Diagnosis not present

## 2021-10-21 DIAGNOSIS — Z79899 Other long term (current) drug therapy: Secondary | ICD-10-CM | POA: Diagnosis not present

## 2021-10-21 NOTE — Assessment & Plan Note (Addendum)
05/16/2011: Left DCIS status postlumpectomy: Intermediate grade, 0.17 cm, ER 99%, Tis N0 status post radiation and tamoxifen completed April 2019 10/06/2021: Screening mammogram detected 0.5 cm left breast mass LIQ: Pleomorphic ILC grade 3, ER 100%, PR 50%, Ki-67 15%, HER2 2+ by IHC, FISH HER2 positive ratio 3.4, copy #5.95  Pathology and radiology counseling: Discussed with the patient, the details of pathology including the type of breast cancer,the clinical staging, the significance of ER, PR and HER-2/neu receptors and the implications for treatment. After reviewing the pathology in detail, we proceeded to discuss the different treatment options between surgery, radiation, chemotherapy, antiestrogen therapies.  Treatment plan: 1.  Breast conserving surgery (we discussed that the standard of care would be a mastectomy.  However breast conserving surgery can be still reasonable) 2. patient is not in a position to handle any chemotherapy.  Therefore we do not need to insert a port. 3.  Adjuvant antiestrogen therapy with letrozole 2.5 mg daily x5 to 7 years  Telephone visit after surgery to discuss final pathology report.

## 2021-10-21 NOTE — Progress Notes (Signed)
Patient Care Team: [name], MD as PCP - General (Internal Medicine) Lindwood Coke, MD (Dermatology) Alphonsa Overall, MD as Consulting Physician (General Surgery) Adrian Prows, MD as Consulting Physician (Cardiology)  DIAGNOSIS:  Encounter Diagnosis  Name Primary?   Ductal carcinoma in situ (DCIS) of left breast    CHIEF COMPLIANT: Follow-up to discuss recurrence for breast cancer  INTERVAL HISTORY: Maria Bolton is a 76 year old with above-mentioned history of DCIS currently on tamoxifen therapy.She presents to the clinic today for a follow-up to discuss recurrence breast cancer.  She is going to undergo breast surgery on 10/26/2021.  She is anxious and worried about surgery.   ALLERGIES:  has No Known Allergies.  MEDICATIONS:  Current Outpatient Medications  Medication Sig Dispense Refill   albuterol (VENTOLIN HFA) 108 (90 Base) MCG/ACT inhaler 2 puffs as needed     amLODipine (NORVASC) 5 MG tablet TAKE 1 TABLET(5 MG) BY MOUTH DAILY 90 tablet 3   cholecalciferol (VITAMIN D) 1000 units tablet Take 1,000 Units by mouth daily.     donepezil (ARICEPT) 10 MG tablet Take 1 tablet (10 mg total) by mouth daily. 90 tablet 4   levothyroxine (SYNTHROID) 25 MCG tablet Take 25 mcg by mouth every morning.     losartan (COZAAR) 100 MG tablet Take 100 mg by mouth daily.     meloxicam (MOBIC) 15 MG tablet Take 15 mg by mouth daily.     memantine (NAMENDA) 10 MG tablet Take 1 tablet (10 mg total) by mouth 2 (two) times daily. 180 tablet 4   metoprolol succinate (TOPROL-XL) 25 MG 24 hr tablet Take 1 tablet (25 mg total) by mouth daily. Take with or immediately following a meal. 90 tablet 3   rosuvastatin (CRESTOR) 10 MG tablet Take 10 mg by mouth daily.     Zoster Vaccine Adjuvanted [facility]) injection Inject 0.5 mLs into the muscle. 0.5 mL 1   No current facility-administered medications for this visit.    PHYSICAL EXAMINATION: ECOG PERFORMANCE STATUS: 1 - Symptomatic but completely  ambulatory  Vitals:   10/21/21 1145  BP: (!) 145/68  Pulse: 72  Resp: 18  Temp: (!) 97.3 F (36.3 C)  SpO2: 100%   Filed Weights   10/21/21 1145  Weight: 207 lb 3.2 oz (94 kg)      LABORATORY DATA:  I have reviewed the data as listed    Latest Ref Rng & Units 02/12/2019    2:55 PM 09/06/2016   11:09 AM 09/18/2015   11:29 PM  CMP  Glucose 70 - 99 mg/dL 103   94   113    BUN 8 - 23 mg/dL 12   19   12     Creatinine 0.44 - 1.00 mg/dL 0.86   0.73   0.70    Sodium 135 - 145 mmol/L 144   144   141    Potassium 3.5 - 5.1 mmol/L 3.7   3.8   3.6    Chloride 98 - 111 mmol/L 109   105   105    CO2 22 - 32 mmol/L 25   24     Calcium 8.9 - 10.3 mg/dL 9.3   9.2     Total Protein 6.5 - 8.1 g/dL 7.1   6.9     Total Bilirubin 0.3 - 1.2 mg/dL 0.6   0.2     Alkaline Phos 38 - 126 U/L 79   69     AST 15 - 41 U/L 19   24  ALT 0 - 44 U/L 12   20       Lab Results  Component Value Date   WBC 8.7 02/12/2019   HGB 13.1 02/12/2019   HCT 39.8 02/12/2019   MCV 85.6 02/12/2019   PLT 197 02/12/2019   NEUTROABS 4.6 02/12/2019    ASSESSMENT & PLAN:  Ductal carcinoma in situ (DCIS) of left breast 05/16/2011: Left DCIS status postlumpectomy: Intermediate grade, 0.17 cm, ER 99%, Tis N0 status post radiation and tamoxifen completed April 2019 10/06/2021: Screening mammogram detected 0.5 cm left breast mass LIQ: Pleomorphic ILC grade 3, ER 100%, PR 50%, Ki-67 15%, HER2 2+ by IHC, FISH HER2 positive ratio 3.4, copy #5.95  Pathology and radiology counseling: Discussed with the patient, the details of pathology including the type of breast cancer,the clinical staging, the significance of ER, PR and HER-2/neu receptors and the implications for treatment. After reviewing the pathology in detail, we proceeded to discuss the different treatment options between surgery, radiation, chemotherapy, antiestrogen therapies.  Treatment plan: 1.  Breast conserving surgery (we discussed that the standard of care  would be a mastectomy.  However breast conserving surgery can be still reasonable) 2. patient is not in a position to handle any chemotherapy.  Therefore we do not need to insert a port. 3.  Adjuvant antiestrogen therapy with letrozole 2.5 mg daily x5 to 7 years  Telephone visit after surgery to discuss final pathology report.   No orders of the defined types were placed in this encounter.  The patient has a good understanding of the overall plan. she agrees with it. she will call with any problems that may develop before the next visit here. Total time spent: 30 mins including face to face time and time spent for planning, charting and co-ordination of care   Harriette Ohara, MD 10/21/21    I Gardiner Coins am scribing for Dr. [name]  I have reviewed the above documentation for accuracy and completeness, and I agree with the above.

## 2021-10-21 NOTE — Progress Notes (Signed)
Pt weight noted to be 207.3 lbs today 10/21/21.  Recently documented weight on 10/21/21 with Outpatient PT recorded as 250.0 lbs.  Pt states she has not lost 43 lbs in two weeks and states her last weight was 205.0 lbs.

## 2021-10-22 ENCOUNTER — Telehealth: Payer: Self-pay | Admitting: Hematology and Oncology

## 2021-10-22 NOTE — Telephone Encounter (Signed)
Scheduled appointment per 5/18 los. Talked to the patients husband and he is aware of the upcoming appointment.

## 2021-10-25 ENCOUNTER — Encounter: Payer: Self-pay | Admitting: *Deleted

## 2021-10-25 NOTE — Progress Notes (Incomplete)
HEMATOLOGY-ONCOLOGY TELEPHONE VISIT PROGRESS NOTE  I connected with '@PTNAME'$ @ on 10/25/21 at  8:15 AM EDT by telephone and verified that I am speaking with the correct person using two identifiers.  I discussed the limitations, risks, security and privacy concerns of performing an evaluation and management service by telephone and the availability of in person appointments.  I also discussed with the patient that there may be a patient responsible charge related to this service. The patient expressed understanding and agreed to proceed.   History of Present Illness: Maria Bolton is a 76 year old with above-mentioned history of DCIS currently on tamoxifen therapy.She presents to the clinic today for via telephone follow-up   Oncology History   No history exists.    REVIEW OF SYSTEMS:   Constitutional: Denies fevers, chills or abnormal weight loss Eyes: Denies blurriness of vision Ears, nose, mouth, throat, and face: Denies mucositis or sore throat Respiratory: Denies cough, dyspnea or wheezes Cardiovascular: Denies palpitation, chest discomfort Gastrointestinal:  Denies nausea, heartburn or change in bowel habits Skin: Denies abnormal skin rashes Lymphatics: Denies new lymphadenopathy or easy bruising Neurological:Denies numbness, tingling or new weaknesses Behavioral/Psych: Mood is stable, no new changes  Extremities: No lower extremity edema Breast: *** denies any pain or lumps or nodules in either breasts All other systems were reviewed with the patient and are negative. Observations/Objective:     Assessment Plan:  No problem-specific Assessment & Plan notes found for this encounter.    I discussed the assessment and treatment plan with the patient. The patient was provided an opportunity to ask questions and all were answered. The patient agreed with the plan and demonstrated an understanding of the instructions. The patient was advised to call back or seek an in-person evaluation  if the symptoms worsen or if the condition fails to improve as anticipated.   I provided *** minutes of non-face-to-face time during this encounter. Rye Dorado Bryson Ha, CMA  I Gardiner Coins am scribing for Dr. [name]  ***

## 2021-10-26 ENCOUNTER — Ambulatory Visit (HOSPITAL_BASED_OUTPATIENT_CLINIC_OR_DEPARTMENT_OTHER): Payer: Medicare Other | Admitting: Anesthesiology

## 2021-10-26 ENCOUNTER — Other Ambulatory Visit: Payer: Self-pay

## 2021-10-26 ENCOUNTER — Other Ambulatory Visit: Payer: Self-pay | Admitting: Cardiology

## 2021-10-26 ENCOUNTER — Encounter (HOSPITAL_BASED_OUTPATIENT_CLINIC_OR_DEPARTMENT_OTHER): Payer: Self-pay | Admitting: General Surgery

## 2021-10-26 ENCOUNTER — Ambulatory Visit (HOSPITAL_BASED_OUTPATIENT_CLINIC_OR_DEPARTMENT_OTHER)
Admission: RE | Admit: 2021-10-26 | Discharge: 2021-10-26 | Disposition: A | Discharge status: Home / Self Care | Payer: Medicare Other | Attending: General Surgery | Admitting: General Surgery

## 2021-10-26 ENCOUNTER — Encounter (HOSPITAL_BASED_OUTPATIENT_CLINIC_OR_DEPARTMENT_OTHER)
Admission: RE | Disposition: A | Discharge status: Home / Self Care | Payer: Self-pay | Source: Home / Self Care | Attending: General Surgery

## 2021-10-26 DIAGNOSIS — Z853 Personal history of malignant neoplasm of breast: Secondary | ICD-10-CM | POA: Insufficient documentation

## 2021-10-26 DIAGNOSIS — Z170 Estrogen receptor positive status [ER+]: Secondary | ICD-10-CM | POA: Insufficient documentation

## 2021-10-26 DIAGNOSIS — E669 Obesity, unspecified: Secondary | ICD-10-CM | POA: Diagnosis not present

## 2021-10-26 DIAGNOSIS — C50212 Malignant neoplasm of upper-inner quadrant of left female breast: Secondary | ICD-10-CM | POA: Insufficient documentation

## 2021-10-26 DIAGNOSIS — I10 Essential (primary) hypertension: Secondary | ICD-10-CM | POA: Diagnosis not present

## 2021-10-26 DIAGNOSIS — Z923 Personal history of irradiation: Secondary | ICD-10-CM | POA: Diagnosis not present

## 2021-10-26 DIAGNOSIS — F0390 Unspecified dementia without behavioral disturbance: Secondary | ICD-10-CM | POA: Insufficient documentation

## 2021-10-26 DIAGNOSIS — J449 Chronic obstructive pulmonary disease, unspecified: Secondary | ICD-10-CM | POA: Insufficient documentation

## 2021-10-26 DIAGNOSIS — Z87891 Personal history of nicotine dependence: Secondary | ICD-10-CM | POA: Insufficient documentation

## 2021-10-26 DIAGNOSIS — E039 Hypothyroidism, unspecified: Secondary | ICD-10-CM | POA: Insufficient documentation

## 2021-10-26 DIAGNOSIS — C50912 Malignant neoplasm of unspecified site of left female breast: Secondary | ICD-10-CM

## 2021-10-26 DIAGNOSIS — Z01818 Encounter for other preprocedural examination: Secondary | ICD-10-CM

## 2021-10-26 DIAGNOSIS — Z6833 Body mass index (BMI) 33.0-33.9, adult: Secondary | ICD-10-CM | POA: Insufficient documentation

## 2021-10-26 HISTORY — PX: BREAST LUMPECTOMY WITH RADIOACTIVE SEED AND SENTINEL LYMPH NODE BIOPSY: SHX6550

## 2021-10-26 HISTORY — DX: Hyperlipidemia, unspecified: E78.5

## 2021-10-26 HISTORY — DX: Chronic diastolic (congestive) heart failure: I50.32

## 2021-10-26 SURGERY — BREAST LUMPECTOMY WITH RADIOACTIVE SEED AND SENTINEL LYMPH NODE BIOPSY
Anesthesia: General | Site: Breast | Laterality: Left

## 2021-10-26 MED ORDER — ACETAMINOPHEN 500 MG PO TABS
ORAL_TABLET | ORAL | Status: AC
  Filled 2021-10-26: qty 2

## 2021-10-26 MED ORDER — HYDROMORPHONE HCL 1 MG/ML IJ SOLN: 0.2500 mg | INTRAMUSCULAR | Status: DC | PRN

## 2021-10-26 MED ORDER — MAGTRACE LYMPHATIC TRACER
INTRAMUSCULAR | Status: DC | PRN
  Administered 2021-10-26: 2 mL via INTRAMUSCULAR

## 2021-10-26 MED ORDER — CHLORHEXIDINE GLUCONATE CLOTH 2 % EX PADS: 6.0000 | MEDICATED_PAD | Freq: Once | CUTANEOUS | Status: DC

## 2021-10-26 MED ORDER — OXYCODONE HCL 5 MG PO TABS: 5.0000 mg | ORAL_TABLET | Freq: Four times a day (QID) | ORAL | 0 refills | Status: AC | PRN

## 2021-10-26 MED ORDER — ACETAMINOPHEN 500 MG PO TABS
1000.0000 mg | ORAL_TABLET | ORAL | Status: AC
  Administered 2021-10-26: 1000 mg via ORAL

## 2021-10-26 MED ORDER — LIDOCAINE 2% (20 MG/ML) 5 ML SYRINGE
INTRAMUSCULAR | Status: AC
  Filled 2021-10-26: qty 5

## 2021-10-26 MED ORDER — PROPOFOL 500 MG/50ML IV EMUL
INTRAVENOUS | Status: AC
  Filled 2021-10-26: qty 50

## 2021-10-26 MED ORDER — DEXAMETHASONE SODIUM PHOSPHATE 4 MG/ML IJ SOLN
INTRAMUSCULAR | Status: DC | PRN
  Administered 2021-10-26: 10 mg via INTRAVENOUS

## 2021-10-26 MED ORDER — AMISULPRIDE (ANTIEMETIC) 5 MG/2ML IV SOLN: 10.0000 mg | Freq: Once | INTRAVENOUS | Status: DC | PRN

## 2021-10-26 MED ORDER — FENTANYL CITRATE (PF) 100 MCG/2ML IJ SOLN
50.0000 ug | Freq: Once | INTRAMUSCULAR | Status: AC
  Administered 2021-10-26: 50 ug via INTRAVENOUS

## 2021-10-26 MED ORDER — ONDANSETRON HCL 4 MG/2ML IJ SOLN
INTRAMUSCULAR | Status: DC | PRN
  Administered 2021-10-26: 4 mg via INTRAVENOUS

## 2021-10-26 MED ORDER — LACTATED RINGERS IV SOLN: INTRAVENOUS | Status: DC

## 2021-10-26 MED ORDER — CHLORHEXIDINE GLUCONATE CLOTH 2 % EX PADS
6.0000 | MEDICATED_PAD | Freq: Once | CUTANEOUS | Status: DC
Start: 2021-10-26 — End: 2021-10-26

## 2021-10-26 MED ORDER — MIDAZOLAM HCL 2 MG/2ML IJ SOLN
2.0000 mg | Freq: Once | INTRAMUSCULAR | Status: AC
Start: 2021-10-26 — End: 2021-10-26
  Administered 2021-10-26: 2 mg via INTRAVENOUS

## 2021-10-26 MED ORDER — LIDOCAINE 2% (20 MG/ML) 5 ML SYRINGE
INTRAMUSCULAR | Status: DC | PRN
  Administered 2021-10-26: 40 mg via INTRAVENOUS

## 2021-10-26 MED ORDER — PROMETHAZINE HCL 25 MG/ML IJ SOLN: 6.2500 mg | INTRAMUSCULAR | Status: DC | PRN

## 2021-10-26 MED ORDER — MIDAZOLAM HCL 2 MG/2ML IJ SOLN
INTRAMUSCULAR | Status: AC
Start: 2021-10-26 — End: ?
  Filled 2021-10-26: qty 2

## 2021-10-26 MED ORDER — ROPIVACAINE HCL 5 MG/ML IJ SOLN
INTRAMUSCULAR | Status: DC | PRN
  Administered 2021-10-26: 30 mL via PERINEURAL

## 2021-10-26 MED ORDER — SUCCINYLCHOLINE CHLORIDE 200 MG/10ML IV SOSY
PREFILLED_SYRINGE | INTRAVENOUS | Status: AC
  Filled 2021-10-26: qty 10

## 2021-10-26 MED ORDER — EPHEDRINE SULFATE (PRESSORS) 50 MG/ML IJ SOLN
INTRAMUSCULAR | Status: DC | PRN
  Administered 2021-10-26: 15 mg via INTRAVENOUS

## 2021-10-26 MED ORDER — CEFAZOLIN SODIUM-DEXTROSE 2-4 GM/100ML-% IV SOLN
INTRAVENOUS | Status: AC
  Filled 2021-10-26: qty 100

## 2021-10-26 MED ORDER — CEFAZOLIN SODIUM-DEXTROSE 2-4 GM/100ML-% IV SOLN
2.0000 g | INTRAVENOUS | Status: AC
  Administered 2021-10-26: 2 g via INTRAVENOUS

## 2021-10-26 MED ORDER — ACETAMINOPHEN 500 MG PO TABS: 1000.0000 mg | ORAL_TABLET | Freq: Once | ORAL | Status: AC

## 2021-10-26 MED ORDER — BUPIVACAINE HCL 0.25 % IJ SOLN
INTRAMUSCULAR | Status: DC | PRN
  Administered 2021-10-26: 15 mL

## 2021-10-26 MED ORDER — EPHEDRINE 5 MG/ML INJ
INTRAVENOUS | Status: AC
  Filled 2021-10-26: qty 5

## 2021-10-26 MED ORDER — PHENYLEPHRINE 80 MCG/ML (10ML) SYRINGE FOR IV PUSH (FOR BLOOD PRESSURE SUPPORT)
PREFILLED_SYRINGE | INTRAVENOUS | Status: AC
  Filled 2021-10-26: qty 10

## 2021-10-26 MED ORDER — OXYCODONE HCL 5 MG/5ML PO SOLN: 5.0000 mg | Freq: Once | ORAL | Status: DC | PRN

## 2021-10-26 MED ORDER — DEXAMETHASONE SODIUM PHOSPHATE 10 MG/ML IJ SOLN
INTRAMUSCULAR | Status: AC
  Filled 2021-10-26: qty 1

## 2021-10-26 MED ORDER — MIDAZOLAM HCL 2 MG/2ML IJ SOLN
INTRAMUSCULAR | Status: AC
  Filled 2021-10-26: qty 2

## 2021-10-26 MED ORDER — ONDANSETRON HCL 4 MG/2ML IJ SOLN
INTRAMUSCULAR | Status: AC
  Filled 2021-10-26: qty 2

## 2021-10-26 MED ORDER — ATROPINE SULFATE 0.4 MG/ML IV SOLN
INTRAVENOUS | Status: AC
Start: 2021-10-26 — End: ?
  Filled 2021-10-26: qty 1

## 2021-10-26 MED ORDER — PROPOFOL 10 MG/ML IV BOLUS
INTRAVENOUS | Status: DC | PRN
Start: 2021-10-26 — End: 2021-10-26
  Administered 2021-10-26: 200 mg via INTRAVENOUS

## 2021-10-26 MED ORDER — FENTANYL CITRATE (PF) 100 MCG/2ML IJ SOLN
INTRAMUSCULAR | Status: DC | PRN
  Administered 2021-10-26: 50 ug via INTRAVENOUS
  Administered 2021-10-26: 25 ug via INTRAVENOUS

## 2021-10-26 MED ORDER — OXYCODONE HCL 5 MG PO TABS: 5.0000 mg | ORAL_TABLET | Freq: Once | ORAL | Status: DC | PRN

## 2021-10-26 MED ORDER — FENTANYL CITRATE (PF) 100 MCG/2ML IJ SOLN
INTRAMUSCULAR | Status: AC
  Filled 2021-10-26: qty 2

## 2021-10-26 SURGICAL SUPPLY — 66 items
ADH SKN CLS APL DERMABOND .7 (GAUZE/BANDAGES/DRESSINGS) ×1
APL PRP STRL LF DISP 70% ISPRP (MISCELLANEOUS) ×1
BINDER BREAST LRG (GAUZE/BANDAGES/DRESSINGS) IMPLANT
BINDER BREAST MEDIUM (GAUZE/BANDAGES/DRESSINGS) IMPLANT
BINDER BREAST XLRG (GAUZE/BANDAGES/DRESSINGS) IMPLANT
BINDER BREAST XXLRG (GAUZE/BANDAGES/DRESSINGS) IMPLANT
BLADE SURG 10 STRL SS (BLADE) ×3 IMPLANT
BLADE SURG 15 STRL LF DISP TIS (BLADE) ×2 IMPLANT
BLADE SURG 15 STRL SS (BLADE) ×2
BNDG COHESIVE 4X5 TAN ST LF (GAUZE/BANDAGES/DRESSINGS) ×3 IMPLANT
CANISTER SUC SOCK COL 7IN (MISCELLANEOUS) IMPLANT
CANISTER SUCT 1200ML W/VALVE (MISCELLANEOUS) ×3 IMPLANT
CHLORAPREP W/TINT 26 (MISCELLANEOUS) ×3 IMPLANT
CLIP TI LARGE 6 (CLIP) ×3 IMPLANT
CLIP TI MEDIUM 6 (CLIP) ×6 IMPLANT
CLIP TI WIDE RED SMALL 6 (CLIP) IMPLANT
COVER MAYO STAND STRL (DRAPES) ×6 IMPLANT
COVER PROBE W GEL 5X96 (DRAPES) ×3 IMPLANT
DERMABOND ADVANCED (GAUZE/BANDAGES/DRESSINGS) ×1
DERMABOND ADVANCED .7 DNX12 (GAUZE/BANDAGES/DRESSINGS) ×2 IMPLANT
DRAPE UTILITY XL STRL (DRAPES) ×3 IMPLANT
DRSG PAD ABDOMINAL 8X10 ST (GAUZE/BANDAGES/DRESSINGS) ×3 IMPLANT
ELECT BLADE 4.0 EZ CLEAN MEGAD (MISCELLANEOUS) ×2
ELECT COATED BLADE 2.86 ST (ELECTRODE) ×3 IMPLANT
ELECT REM PT RETURN 9FT ADLT (ELECTROSURGICAL) ×2
ELECTRODE BLDE 4.0 EZ CLN MEGD (MISCELLANEOUS) IMPLANT
ELECTRODE REM PT RTRN 9FT ADLT (ELECTROSURGICAL) ×2 IMPLANT
GAUZE SPONGE 4X4 12PLY STRL LF (GAUZE/BANDAGES/DRESSINGS) ×3 IMPLANT
GLOVE BIO SURGEON STRL SZ 6 (GLOVE) ×3 IMPLANT
GLOVE BIOGEL PI IND STRL 6.5 (GLOVE) ×2 IMPLANT
GLOVE BIOGEL PI INDICATOR 6.5 (GLOVE) ×1
GOWN STRL REUS W/ TWL LRG LVL3 (GOWN DISPOSABLE) ×2 IMPLANT
GOWN STRL REUS W/TWL 2XL LVL3 (GOWN DISPOSABLE) ×3 IMPLANT
GOWN STRL REUS W/TWL LRG LVL3 (GOWN DISPOSABLE) ×2
KIT MARKER MARGIN INK (KITS) ×3 IMPLANT
LIGHT WAVEGUIDE WIDE FLAT (MISCELLANEOUS) IMPLANT
NDL HYPO 25X1 1.5 SAFETY (NEEDLE) ×2 IMPLANT
NDL SAFETY ECLIPSE 18X1.5 (NEEDLE) ×2 IMPLANT
NEEDLE HYPO 18GX1.5 SHARP (NEEDLE) ×2
NEEDLE HYPO 25X1 1.5 SAFETY (NEEDLE) ×2 IMPLANT
NS IRRIG 1000ML POUR BTL (IV SOLUTION) ×3 IMPLANT
PACK BASIN DAY SURGERY FS (CUSTOM PROCEDURE TRAY) ×3 IMPLANT
PACK UNIVERSAL I (CUSTOM PROCEDURE TRAY) ×3 IMPLANT
PENCIL SMOKE EVACUATOR (MISCELLANEOUS) ×3 IMPLANT
SLEEVE SCD COMPRESS KNEE MED (STOCKING) ×3 IMPLANT
SPIKE FLUID TRANSFER (MISCELLANEOUS) IMPLANT
SPONGE T-LAP 18X18 ~~LOC~~+RFID (SPONGE) ×6 IMPLANT
STAPLER VISISTAT 35W (STAPLE) IMPLANT
STOCKINETTE IMPERVIOUS LG (DRAPES) ×3 IMPLANT
STRIP CLOSURE SKIN 1/2X4 (GAUZE/BANDAGES/DRESSINGS) ×3 IMPLANT
SUT ETHILON 2 0 FS 18 (SUTURE) IMPLANT
SUT MNCRL AB 4-0 PS2 18 (SUTURE) ×3 IMPLANT
SUT MON AB 5-0 PS2 18 (SUTURE) IMPLANT
SUT SILK 2 0 SH (SUTURE) IMPLANT
SUT VIC AB 2-0 SH 27 (SUTURE) ×2
SUT VIC AB 2-0 SH 27XBRD (SUTURE) ×2 IMPLANT
SUT VIC AB 3-0 SH 27 (SUTURE) ×2
SUT VIC AB 3-0 SH 27X BRD (SUTURE) ×2 IMPLANT
SUT VICRYL 3-0 CR8 SH (SUTURE) ×3 IMPLANT
SYR BULB EAR ULCER 3OZ GRN STR (SYRINGE) ×3 IMPLANT
SYR CONTROL 10ML LL (SYRINGE) ×3 IMPLANT
TOWEL GREEN STERILE FF (TOWEL DISPOSABLE) ×3 IMPLANT
TRACER MAGTRACE VIAL (MISCELLANEOUS) IMPLANT
TRAY FAXITRON CT DISP (TRAY / TRAY PROCEDURE) ×3 IMPLANT
TUBE CONNECTING 20X1/4 (TUBING) ×3 IMPLANT
YANKAUER SUCT BULB TIP NO VENT (SUCTIONS) ×3 IMPLANT

## 2021-10-26 NOTE — Anesthesia Preprocedure Evaluation (Addendum)
Anesthesia Evaluation  Patient identified by MRN, date of birth, ID band Patient awake    Reviewed: Allergy & Precautions, NPO status , Patient's Chart, lab work & pertinent test results  Airway Mallampati: II  TM Distance: >3 FB Neck ROM: Full    Dental no notable dental hx.    Pulmonary neg pulmonary ROS, COPD,  COPD inhaler, former smoker,    Pulmonary exam normal breath sounds clear to auscultation       Cardiovascular hypertension, Pt. on medications negative cardio ROS Normal cardiovascular exam Rhythm:Regular Rate:Normal  08/2020 ECHO: Normal LV systolic function with visual EF 60-65%. Left ventricle cavity is normal in size. Mild LVH. Normal global Tarman motion. Normal diastolic filling pattern, normal LAP.  No significant valvular abnormalities  08/2020 Stress: Normal myocardial perfusion, without convincing evidence of reversible ischemia or prior infarct. LVEF 61%, LV size is normal, myocardial Malloy thickness is preserved without regional Zacher motion abnormalities    Neuro/Psych Anxiety Dementia negative neurological ROS  negative psych ROS   GI/Hepatic negative GI ROS, Neg liver ROS,   Endo/Other  negative endocrine ROSHypothyroidism   Renal/GU negative Renal ROS  negative genitourinary   Musculoskeletal negative musculoskeletal ROS (+)   Abdominal (+) + obese,   Peds negative pediatric ROS (+)  Hematology negative hematology ROS (+)   Anesthesia Other Findings Breast cancer  Reproductive/Obstetrics negative OB ROS                            Anesthesia Physical Anesthesia Plan  ASA: 3  Anesthesia Plan: General   Post-op Pain Management: Regional block*, Tylenol PO (pre-op)* and Dilaudid IV   Induction: Intravenous  PONV Risk Score and Plan: 3 and Ondansetron, Dexamethasone, Treatment may vary due to age or medical condition and Midazolam  Airway Management Planned:  LMA  Additional Equipment: None  Intra-op Plan:   Post-operative Plan:   Informed Consent:   Plan Discussed with:   Anesthesia Plan Comments: (Plan routine monitors, GA with pectoralis block for post op analgesia)       Anesthesia Quick Evaluation

## 2021-10-26 NOTE — Anesthesia Procedure Notes (Signed)
Procedure Name: LMA Insertion Date/Time: 10/26/2021 11:01 AM Performed by: Willa Frater, CRNA Pre-anesthesia Checklist: Patient identified, Emergency Drugs available, Suction available and Patient being monitored Patient Re-evaluated:Patient Re-evaluated prior to induction Oxygen Delivery Method: Circle system utilized Preoxygenation: Pre-oxygenation with 100% oxygen Induction Type: IV induction Ventilation: Mask ventilation without difficulty LMA: LMA inserted LMA Size: 4.0 Number of attempts: 1 Airway Equipment and Method: Bite block Placement Confirmation: positive ETCO2 Tube secured with: Tape Dental Injury: Teeth and Oropharynx as per pre-operative assessment

## 2021-10-26 NOTE — H&P (Signed)
REFERRING PHYSICIAN: Robbi Garter, MD  PROVIDER: Georgianne Fick, MD  Care Team: Patient Care Team: Alden Server, MD as PCP - General (Internal Medicine) Barry Dienes Ballard Russell, MD as Consulting Provider (Surgical Oncology) Wilber Bihari, NP (Oncology)   MRN: [id number] DOB: [birth year] DATE OF ENCOUNTER: 10/15/2021  Subjective   Chief Complaint: Left Breast Cancer   History of Present Illness: Maria Bolton is a 76 y.o. female who is seen today as an office consultation at the request of Dr. Truman Hayward for evaluation of Left Breast Cancer  Pt presents with a new diagnosis of left breast cancer 10/2021. 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 She had negative genetic testing at that time.   This time she presents with a screening detected left breast mass. Diagnostic mammogram confirmed this showing a 5 mm mass at 9 o'clock. The axilla was negative. 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   Her mother had history of breast cancer as well as a maternal aunt, uncle, and cousin.   Of note, she has developed some dementia. She continues in her daily activities, but has issues if this is too altered. She has mostly some memory issues, but sometimes has comprehension issues. She drives occasionally, but only short distances while accompanied by family member. She is very active outside with gardening, mowing, trimming bushes, etc. She is "hard to keep down."  She also had significant pain in her left breast for quite a while following her surgery and radiation in 2012.   Diagnostic mammogram:[facility] 09/28/2021 0.5 cm mass in the left breast at 9 o'clock 6 CFN. U/s 5x2x3 mm mass with indistinct margin posterior depth 9 o'clock  Pathology core needle biopsy: 10/06/2021 Breast, lower-inner  quadrant - INVASIVE PLEOMORPHIC LOBULAR CARCINOMA Based on the biopsy, the carcinoma appears Nottingham grade 3 of 3 and measures 0.4 cm in greatest linear extent.  Receptors: Estrogen Receptor: 100%, POSITIVE, STRONG STAINING INTENSITY Progesterone Receptor: 50%, POSITIVE, STRONG STAINING INTENSITY Proliferation Marker Ki67: 15%  Review of Systems: A complete review of systems was obtained from the patient. I have reviewed this information and discussed as appropriate with the patient. See HPI as well for other ROS.  Review of Systems  Psychiatric/Behavioral: Positive for memory loss.  All other systems reviewed and are negative.   Medical History: Past Medical History:  Diagnosis Date   Arthritis   Dementia (CMS-HCC)   History of cancer   Thyroid disease   Patient Active Problem List  Diagnosis   Malignant neoplasm of upper-inner quadrant of left breast in female, estrogen receptor positive (CMS-HCC)   History of left breast cancer   Family history of breast cancer   Past Surgical History:  Procedure Laterality Date   Left Breast Lumpectomy 05/16/2011  Dr. Keturah Barre. [place]    No Known Allergies  Current Outpatient Medications on File Prior to Visit  Medication Sig Dispense Refill   amLODIPine (NORVASC) 5 MG tablet amlodipine 5 mg tablet   donepeziL (ARICEPT) 10 MG tablet donepezil 10 mg tablet   gentamicin (GARAMYCIN) 0.1 % ointment gentamicin 0.1 % topical ointment APPLY TOPICALLY TO THE AFFECTED AREA TWICE DAILY FOR 7 DAYS AS DIRECTED   levothyroxine (SYNTHROID) 25 MCG tablet levothyroxine 25 mcg tablet TAKE 1 TABLET BY MOUTH EVERY DAY IN THE MORNING ON AN EMPTY  STOMACH   meloxicam (MOBIC) 15 MG tablet 1 tablet   memantine (NAMENDA) 10 MG tablet memantine 10 mg tablet   metoprolol succinate (TOPROL-XL) 25 MG XL tablet metoprolol succinate ER 25 mg tablet,extended release 24 hr   nitrofurantoin, macrocrystal-monohydrate, (MACROBID) 100 MG capsule 1 capsule    acetaminophen (TYLENOL) 500 mg capsule 1 tablet as needed   cholecalciferol (VITAMIN D3) 1000 unit tablet Take by mouth   diclofenac (VOLTAREN ARTHRITIS PAIN) 1 % topical gel as directed   losartan (COZAAR) 100 MG tablet losartan 100 mg tablet TAKE 1 TABLET BY MOUTH EVERY DAY   rosuvastatin (CRESTOR) 10 MG tablet rosuvastatin 10 mg tablet TAKE 1 TABLET BY MOUTH EVERY DAY   No current facility-administered medications on file prior to visit.   Family History  Problem Relation Age of Onset   Obesity Mother   Breast cancer Mother   Obesity Father    Social History   Tobacco Use  Smoking Status Former   Types: Cigarettes   Quit date: 2013   Years since quitting: 10.3  Smokeless Tobacco Never    Social History   Socioeconomic History   Marital status: Married  Tobacco Use   Smoking status: Former  Types: Cigarettes  Quit date: 2013  Years since quitting: 10.3   Smokeless tobacco: Never  Substance and Sexual Activity   Alcohol use: Never   Drug use: Never   Objective:   Vitals:  10/15/21 0946  BP: (!) 144/80  Pulse: 59  Temp: 36.8 C (98.2 F)  SpO2: 99%  Weight: 93.1 kg (205 lb 3.2 oz)  Height: 162.6 cm (5' 4" )   Body mass index is 35.22 kg/m.  Gen: No acute distress. Well nourished and well groomed.  Neurological: Alert and oriented to person, place, and time. Coordination normal.  Head: Normocephalic and atraumatic.  Eyes: Conjunctivae are normal. Pupils are equal, round, and reactive to light. No scleral icterus.  Neck: Normal range of motion. Neck supple. No tracheal deviation or thyromegaly present.  Cardiovascular: Normal rate, regular rhythm, normal heart sounds and intact distal pulses. Exam reveals no gallop and no friction rub. No murmur heard. Breast: right breast benign, no nipple retraction, dimpling, discharge, LAD, masses. Left breast with post op changes laterally. Contour defect at transverse incision. Left breast smaller. Anticipated  thickening at lumpectomy site. No palpable masses.  Respiratory: Effort normal. No respiratory distress. No chest Shepardson tenderness. Breath sounds normal. No wheezes, rales or rhonchi.  GI: Soft. Bowel sounds are normal. The abdomen is soft and nontender. There is no rebound and no guarding.  Musculoskeletal: Normal range of motion. Extremities are nontender.  Lymphadenopathy: No cervical, preauricular, postauricular or axillary adenopathy is present Skin: Skin is warm and dry. No rash noted. No diaphoresis. No erythema. No pallor. No clubbing, cyanosis, or edema.  Psychiatric: Normal mood and affect. Behavior is normal. Judgment and thought content normal.   Labs n/a  Assessment and Plan:  Malignant neoplasm of upper-inner quadrant of left breast in female, estrogen receptor positive (CMS-HCC) (primary encounter diagnosis) Plan: Ambulatory Referral to Oncology-Medical,  Ambulatory Referral to Genetics  History of left breast cancer  Family history of breast cancer  Pt has a recurrent small left breast cancer. It would be reasonable in this case to perform a second lumpectomy if desired despite prior radiation. Certainly mastectomy would also be a reasonable option. I would plan SLN bx given grade 3 status and the fact that we don't have her 2 information.   We  will offer updated genetic testing as well.  She will also need to talk to oncology. Most likely they would not send oncotype unless we had surprises on pathology. They would discuss antihormonal tx.   After discussion, seed localized lumpectomy is planned.  The surgical procedure was described to the patient. I discussed the incision type and location and that we would need radiology involved on with a wire or seed marker and/or sentinel node.   The risks and benefits of the procedure were described to the patient and she wishes to proceed.   We discussed the risks bleeding, infection, damage to other structures, need for further  procedures/surgeries. We discussed the risk of seroma. The patient was advised if the area in the breast in cancer, we may need to go back to surgery for additional tissue to obtain negative margins or for a lymph node biopsy. The patient was advised that these are the most common complications, but that others can occur as well. They were advised against taking aspirin or other anti-inflammatory agents/blood thinners the week before surgery.   Dementia  Discussed risk of change in memory post anesthesia.   No follow-ups on file.  Milus Height, MD FACS Surgical Oncology, General Surgery, Trauma and [facility] [place]

## 2021-10-26 NOTE — Transfer of Care (Signed)
Immediate Anesthesia Transfer of Care Note  Patient: Maria Bolton  Procedure(s) Performed: LEFT BREAST LUMPECTOMY WITH RADIOACTIVE SEED AND SENTINEL LYMPH NODE BIOPSY (Left: Breast)  Patient Location: PACU  Anesthesia Type:GA combined with regional for post-op pain  Level of Consciousness: awake, alert  and patient cooperative  Airway & Oxygen Therapy: Patient Spontanous Breathing and Patient connected to face mask oxygen  Post-op Assessment: Report given to RN and Post -op Vital signs reviewed and stable  Post vital signs: Reviewed and stable  Last Vitals:  Vitals Value Taken Time  BP    Temp    Pulse 59 10/26/21 1300  Resp 13 10/26/21 1300  SpO2 100 % 10/26/21 1300  Vitals shown include unvalidated device data.  Last Pain:  Vitals:   10/26/21 0820  TempSrc: Oral  PainSc: 1       Patients Stated Pain Goal: 1 (42/39/53 2023)  Complications: No notable events documented.

## 2021-10-26 NOTE — Anesthesia Postprocedure Evaluation (Signed)
Anesthesia Post Note  Patient: Brittan Butterbaugh  Procedure(s) Performed: LEFT BREAST LUMPECTOMY WITH RADIOACTIVE SEED AND SENTINEL LYMPH NODE BIOPSY (Left: Breast)     Patient location during evaluation: PACU Anesthesia Type: General Level of consciousness: awake and alert Pain management: pain level controlled Vital Signs Assessment: post-procedure vital signs reviewed and stable Respiratory status: spontaneous breathing, nonlabored ventilation and respiratory function stable Cardiovascular status: blood pressure returned to baseline and stable Postop Assessment: no apparent nausea or vomiting Anesthetic complications: no   No notable events documented.  Last Vitals:  Vitals:   10/26/21 1339 10/26/21 1350  BP:  135/75  Pulse: (!) 58 66  Resp: 10 14  Temp:  (!) 36.3 C  SpO2: 97% 95%    Last Pain:  Vitals:   10/26/21 1350  TempSrc:   PainSc: 0-No pain                 Lynda Rainwater

## 2021-10-26 NOTE — Progress Notes (Signed)
Assisted Dr. Miller with left, pectoralis, ultrasound guided block. Side rails up, monitors on throughout procedure. See vital signs in flow sheet. Tolerated Procedure well. 

## 2021-10-26 NOTE — Op Note (Signed)
Left Breast Radioactive seed localized lumpectomy and sentinel lymph node biopsy  Indications: This patient presents with history of left breast cancer, upper inner quadrant, grade 3 invasive lobular carcinoma, +/+/+, [id number]  Pre-operative Diagnosis: left breast cancer  Post-operative Diagnosis: Same  Surgeon: [id number]   Assistant: Nicanor Alcon, MD, PGY6  Anesthesia: General endotracheal anesthesia  ASA Class: 3  Procedure Details  The patient was seen in the Holding Room. The risks, benefits, complications, treatment options, and expected outcomes were discussed with the patient. The possibilities of bleeding, infection, the need for additional procedures, failure to diagnose a condition, and creating a complication requiring transfusion or operation were discussed with the patient. The patient concurred with the proposed plan, giving informed consent.  The site of surgery properly noted/marked. The patient was taken to Operating Room # 2, identified, and the procedure verified as Left Breast Seed localized Lumpectomy with sentinel lymph node biopsy. A Time Out was held and the above information confirmed. MagTrace was injected into the subareolar location  The left arm, breast, and chest were prepped and draped in standard fashion. The lumpectomy was performed by creating a transverse medial incision near the previously placed radioactive seed.  Dissection was carried down to around the point of maximum signal intensity. The cautery was used to perform the dissection.  Hemostasis was achieved with cautery. The edges of the cavity were marked with large clips, with one each medial, lateral, inferior and superior, and two clips posteriorly.   The specimen was inked with the margin marker paint kit.    Specimen radiography confirmed inclusion of the mammographic lesion, the clip, and the seed.  It appeared close to the medial and inferior margin, so additional tissue was taken in those  locations.  The background signal in the breast was zero.  The wound was irrigated and closed with 3-0 vicryl in layers and 4-0 monocryl subcuticular suture.    Using the Sentimag probe, left axillary sentinel nodes were identified transcutaneously.  An oblique incision was created below the axillary hairline.  Dissection was carried through the clavipectoral fascia.  Three deep level 2 axillary sentinel nodes were removed.  Counts per second were 0 (palpable), 35, and 390.    The background count was 0 cps.  The wound was irrigated.  Hemostasis was achieved with cautery.  The axillary incision was closed with a 3-0 vicryl deep dermal interrupted sutures and a 4-0 monocryl subcuticular closure.    Sterile dressings were applied. At the end of the operation, all sponge, instrument, and needle counts were correct.  Findings: grossly clear surgical margins and no adenopathy, posterior margin is pectoralis, anterior margin is skin  Estimated Blood Loss:  min         Specimens: left breast tissue with seed, additional inferior margin, additional medial margin, and three deep left axillary sentinel lymph nodes.             Complications:  None; patient tolerated the procedure well.         Disposition: PACU - hemodynamically stable.         Condition: stable

## 2021-10-26 NOTE — Anesthesia Procedure Notes (Signed)
Anesthesia Regional Block: Pectoralis block   Pre-Anesthetic Checklist: , timeout performed,  Correct Patient, Correct Site, Correct Laterality,  Correct Procedure, Correct Position, site marked,  Risks and benefits discussed,  Surgical consent,  Pre-op evaluation,  At surgeon's request and post-op pain management  Laterality: Left  Prep: chloraprep       Needles:  Injection technique: Single-shot  Needle Type: Stimiplex     Needle Length: 9cm  Needle Gauge: 21     Additional Needles:   Procedures:,,,, ultrasound used (permanent image in chart),,    Narrative:  Start time: 10/26/2021 9:27 AM End time: 10/26/2021 9:32 AM Injection made incrementally with aspirations every 5 mL.  Performed by: Personally  Anesthesiologist: Lynda Rainwater, MD

## 2021-10-26 NOTE — Discharge Instructions (Addendum)
[facility] Office Phone Number [phone]  BREAST BIOPSY/ PARTIAL MASTECTOMY: POST OP INSTRUCTIONS  Always review your discharge instruction sheet given to you by the facility where your surgery was performed.  IF YOU HAVE DISABILITY OR FAMILY LEAVE FORMS, YOU MUST BRING THEM TO THE OFFICE FOR PROCESSING.  DO NOT GIVE THEM TO YOUR DOCTOR.  Take 2 tylenol (acetominophen) three times a day for 3 days.  If you still have pain, add ibuprofen with food in between if able to take this (if you have kidney issues or stomach issues, do not take ibuprofen).  If both of those are not enough, add the narcotic pain pill.  If you find you are needing a lot of this overnight after surgery, call the next morning for a refill.    Prescriptions will not be filled after 5pm or on week-ends. Take your usually prescribed medications unless otherwise directed You should eat very light the first 24 hours after surgery, such as soup, crackers, pudding, etc.  Resume your normal diet the day after surgery. Most patients will experience some swelling and bruising in the breast.  Ice packs and a good support bra will help.  Swelling and bruising can take several days to resolve.  It is common to experience some constipation if taking pain medication after surgery.  Increasing fluid intake and taking a stool softener will usually help or prevent this problem from occurring.  A mild laxative (Milk of Magnesia or Miralax) should be taken according to package directions if there are no bowel movements after 48 hours. Unless discharge instructions indicate otherwise, you may remove your bandages 48 hours after surgery, and you may shower at that time.  You may have steri-strips (small skin tapes) in place directly over the incision.  These strips should be left on the skin at least for for 7-10 days.    ACTIVITIES:  You may resume regular daily activities (gradually increasing) beginning the next day.  Wearing a  good support bra or sports bra (or the breast binder) minimizes pain and swelling.  You may have sexual intercourse when it is comfortable. No heavy lifting for 1-2 weeks (not over around 10 pounds).  You may drive when you no longer are taking prescription pain medication, you can comfortably wear a seatbelt, and you can safely maneuver your car and apply brakes. RETURN TO WORK:  __________3-14 days depending on job. _______________ You should see your doctor in the office for a follow-up appointment approximately two weeks after your surgery.  Your doctor's nurse will typically make your follow-up appointment when she calls you with your pathology report.  Expect your pathology report 3-4 business days after your surgery.  You may call to check if you do not hear from us after three days.   WHEN TO CALL YOUR DOCTOR: Fever over 101.0 Nausea and/or vomiting. Extreme swelling or bruising. Continued bleeding from incision. Increased pain, redness, or drainage from the incision.  The clinic staff is available to answer your questions during regular business hours.  Please don't hesitate to call and ask to speak to one of the nurses for clinical concerns.  If you have a medical emergency, go to the nearest emergency room or call 911.  A surgeon from [facility] is always on call at the hospital.  For further questions, please visit [email or web address]    Post Anesthesia Home Care Instructions  Activity: Get plenty of rest for the remainder of the day. A responsible individual must stay   with you for 24 hours following the procedure.  For the next 24 hours, DO NOT: -Drive a car -Operate machinery -Drink alcoholic beverages -Take any medication unless instructed by your physician -Make any legal decisions or sign important papers.  Meals: Start with liquid foods such as gelatin or soup. Progress to regular foods as tolerated. Avoid greasy, spicy, heavy foods. If nausea  and/or vomiting occur, drink only clear liquids until the nausea and/or vomiting subsides. Call your physician if vomiting continues.  Special Instructions/Symptoms: Your throat may feel dry or sore from the anesthesia or the breathing tube placed in your throat during surgery. If this causes discomfort, gargle with warm salt water. The discomfort should disappear within 24 hours.  If you had a scopolamine patch placed behind your ear for the management of post- operative nausea and/or vomiting:  1. The medication in the patch is effective for 72 hours, after which it should be removed.  Wrap patch in a tissue and discard in the trash. Wash hands thoroughly with soap and water. 2. You may remove the patch earlier than 72 hours if you experience unpleasant side effects which may include dry mouth, dizziness or visual disturbances. 3. Avoid touching the patch. Wash your hands with soap and water after contact with the patch.     

## 2021-10-27 ENCOUNTER — Encounter (HOSPITAL_BASED_OUTPATIENT_CLINIC_OR_DEPARTMENT_OTHER): Payer: Self-pay | Admitting: General Surgery

## 2021-10-27 NOTE — Progress Notes (Signed)
Left message stating courtesy call and if any questions or concerns please call the doctors office.  

## 2021-10-29 ENCOUNTER — Other Ambulatory Visit: Payer: Self-pay | Admitting: Cardiology

## 2021-10-29 DIAGNOSIS — I10 Essential (primary) hypertension: Secondary | ICD-10-CM

## 2021-11-02 ENCOUNTER — Inpatient Hospital Stay: Payer: Medicare Other | Admitting: Hematology and Oncology

## 2021-11-02 ENCOUNTER — Encounter: Payer: Self-pay | Admitting: *Deleted

## 2021-11-02 LAB — SURGICAL PATHOLOGY

## 2021-11-04 ENCOUNTER — Inpatient Hospital Stay: Payer: Medicare Other | Attending: Hematology and Oncology | Admitting: Hematology and Oncology

## 2021-11-04 DIAGNOSIS — C50312 Malignant neoplasm of lower-inner quadrant of left female breast: Secondary | ICD-10-CM

## 2021-11-04 DIAGNOSIS — Z170 Estrogen receptor positive status [ER+]: Secondary | ICD-10-CM

## 2021-11-04 MED ORDER — LETROZOLE 2.5 MG PO TABS: 2.5000 mg | ORAL_TABLET | Freq: Every day | ORAL | 3 refills | Status: DC

## 2021-11-04 NOTE — Assessment & Plan Note (Addendum)
10/26/2021:Left lumpectomy: Grade 3 ILC 2.1 cm, 0/4 lymph nodes negative, ER 100% PR 50%, HER2 equivocal by IHC positive by FISH ratio 3.4, Ki-67 15%  Pathology counseling: I discussed the final pathology report of the patient provided  a copy of this report. I discussed the margins as well as lymph node surgeries. We also discussed the final staging along with previously performed ER/PR and HER-2/neu testing.   Treatment plan: 1.  Ideally she needs adjuvant chemotherapy with Herceptin but she is not able to handle any chemotherapy (severe dementia) 2. adjuvant radiation 3. adjuvant antiestrogen therapy with letrozole

## 2021-11-04 NOTE — Progress Notes (Signed)
HEMATOLOGY-ONCOLOGY TELEPHONE VISIT PROGRESS NOTE  I connected with Maria Bolton on 11/04/21 at 10:00 AM EDT by telephone and verified that I am speaking with the correct person using two identifiers.  I discussed the limitations, risks, security and privacy concerns of performing an evaluation and management service by telephone and the availability of in person appointments.  I also discussed with the patient that there may be a patient responsible charge related to this service. The patient expressed understanding and agreed to proceed.   History of Present Illness: Recovering very well from recent surgery.  Does have mild to moderate discomfort of the surgical scar.  Oncology History  Ductal carcinoma in situ (DCIS) of left breast  05/16/2011 Initial Biopsy   Left DCIS status postlumpectomy: Intermediate grade, 0.17 cm, ER 99%, Tis N0 status post radiation and tamoxifen completed April 2019   10/26/2021 Surgery   Left lumpectomy: Grade 3 ILC 2.1 cm, 0/4 lymph nodes negative, ER 100% PR 50%, HER2 equivocal by IHC positive by FISH ratio 3.4, Ki-67 15%   Malignant neoplasm of lower-inner quadrant of left breast in female, estrogen receptor positive ([place])  05/16/2011 Initial Biopsy   Left DCIS status postlumpectomy: Intermediate grade, 0.17 cm, ER 99%, Tis N0 status post radiation and tamoxifen completed April 2019   10/06/2021 Initial Diagnosis   10/06/2021: Screening mammogram detected 0.5 cm left breast mass LIQ: Pleomorphic ILC grade 3, ER 100%, PR 50%, Ki-67 15%, HER2 2+ by IHC, FISH HER2 positive ratio 3.4, copy #5.95   10/26/2021 Surgery   Left lumpectomy: Grade 3 ILC 2.1 cm, 0/4 lymph nodes negative, ER 100% PR 50%, HER2 equivocal by IHC positive by FISH ratio 3.4, Ki-67 15%     REVIEW OF SYSTEMS:   Constitutional: Denies fevers, chills or abnormal weight loss   All other systems were reviewed with the patient and are negative. Observations/Objective:     Assessment Plan:  Malignant  neoplasm of lower-inner quadrant of left breast in female, estrogen receptor positive ([place]) 10/26/2021:Left lumpectomy: Grade 3 ILC 2.1 cm, 0/4 lymph nodes negative, ER 100% PR 50%, HER2 equivocal by IHC positive by FISH ratio 3.4, Ki-67 15%  Pathology counseling: I discussed the final pathology report of the patient provided  a copy of this report. I discussed the margins as well as lymph node surgeries. We also discussed the final staging along with previously performed ER/PR and HER-2/neu testing. She is not a candidate for radiation because of prior radiation therapy  Treatment plan: 1.  Ideally she needs adjuvant chemotherapy with Herceptin but she is not able to handle any chemotherapy (severe dementia) 2. adjuvant antiestrogen therapy with letrozole  Return to clinic in 3 months for survivorship care plan visit  I discussed the assessment and treatment plan with the patient. The patient was provided an opportunity to ask questions and all were answered. The patient agreed with the plan and demonstrated an understanding of the instructions. The patient was advised to call back or seek an in-person evaluation if the symptoms worsen or if the condition fails to improve as anticipated.   I provided 12 minutes of non-face-to-face time during this encounter. Harriette Ohara, MD

## 2021-11-05 ENCOUNTER — Telehealth: Payer: Self-pay | Admitting: Hematology and Oncology

## 2021-11-05 ENCOUNTER — Encounter: Payer: Self-pay | Admitting: *Deleted

## 2021-11-05 DIAGNOSIS — Z170 Estrogen receptor positive status [ER+]: Secondary | ICD-10-CM

## 2021-11-05 NOTE — Telephone Encounter (Signed)
Scheduled appointment per 6/1 los. Patient is aware.

## 2021-11-11 ENCOUNTER — Encounter (HOSPITAL_COMMUNITY): Payer: Self-pay

## 2021-11-15 NOTE — Therapy (Incomplete)
OUTPATIENT PHYSICAL THERAPY BREAST CANCER POST OP FOLLOW UP   Patient Name: Maria Bolton MRN: [id number] DOB:1945/12/04, 76 y.o., female Today's Date: 11/15/2021    Past Medical History:  Diagnosis Date   Breast cancer ([place]) 04/2011   DCIS left breast , ER/PR+   Chronic diastolic heart failure ([place])    Difficulty sleeping    since dx of cancer   Hyperlipidemia    Hypertension 05/11/2011   Memory loss    Past Surgical History:  Procedure Laterality Date   BREAST LUMPECTOMY  05/16/2011   Procedure: LUMPECTOMY;  Surgeon: Maria Medal, MD;  Location: [facility];  Service: General;  Laterality: Left;  Needle localization Left breast lumpectomy   BREAST LUMPECTOMY WITH RADIOACTIVE SEED AND SENTINEL LYMPH NODE BIOPSY Left 10/26/2021   Procedure: LEFT BREAST LUMPECTOMY WITH RADIOACTIVE SEED AND SENTINEL LYMPH NODE BIOPSY;  Surgeon: Maria Klein, MD;  Location: [facility];  Service: General;  Laterality: Left;   [place]  05/16/2011   Procedure: NEVUS EXCISION;  Surgeon: Maria Medal, MD;  Location: [facility];  Service: General;  Laterality: Left;  remove mole left breast   Patient Active Problem List   Diagnosis Date Noted   Malignant neoplasm of lower-inner quadrant of left breast in female, estrogen receptor positive ([place]) 10/18/2021   Dementia without behavioral disturbance ([place]) 02/02/2021   Abnormal thyroid blood test 12/27/2016   Abnormal glucose 09/06/2016   Mild cognitive impairment 09/06/2016   Fever 09/19/2015   Acute bronchitis 09/19/2015   Anxiety about health 09/19/2015   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 Lindi Bolton  REFERRING DIAG: C50.312,Z17.0 (ICD-10-CM) - Malignant neoplasm of lower-inner quadrant of left breast in female, estrogen receptor positive ([place]  THERAPY DIAG:  No diagnosis  found.  Rationale for Evaluation and Treatment Rehabilitation  ONSET DATE: 10/04/21  SUBJECTIVE:                                                                                                                                                                                           SUBJECTIVE STATEMENT: ***  PERTINENT HISTORY:  Pt underwent Lt lumpectomy with SLNB - 0/4 nodes positive on 10/26/21. ER/PR positive. Hx of breast cancer on the same side in 2012 treated with lumpectomy, radiation, and endrocrine therapy. Aspiration of cellulitis and started on antibiotics 11/12/21. No further treatment due to dementia.   PATIENT GOALS:  Reassess how my recovery is going related to arm function, pain, and swelling.  PAIN:  Are you having pain? {OPRCPAIN:27236}  PRECAUTIONS: Recent Surgery, {RIGHT/LEFT:21944} UE Lymphedema risk, {Therapy precautions:24002}  ACTIVITY LEVEL / LEISURE: ***   OBJECTIVE:   PATIENT SURVEYS:  QUICK DASH: ***  OBSERVATIONS:  ***  POSTURE:  ***  LYMPHEDEMA ASSESSMENT:   UPPER EXTREMITY AROM/PROM:   A/PROM RIGHT  10/19/2021    Shoulder extension    Shoulder flexion    Shoulder abduction    Shoulder internal rotation    Shoulder external rotation                            (Blank rows = not tested)   A/PROM LEFT  10/19/2021  Shoulder extension 50  Shoulder flexion 150  Shoulder abduction 161  Shoulder internal rotation    Shoulder external rotation 85                          (Blank rows = not tested)     CERVICAL AROM: All within normal limits:    LYMPHEDEMA ASSESSMENTS:    LANDMARK RIGHT  10/19/2021  10 cm proximal to olecranon process    Olecranon process    10 cm proximal to ulnar styloid process    Just proximal to ulnar styloid process    Across hand at thumb web space    At base of 2nd digit    (Blank rows = not tested)   LANDMARK LEFT  10/19/2021  10 cm proximal to olecranon process 39.5  Olecranon process 30.0  10 cm proximal  to ulnar styloid process 25.3  Just proximal to ulnar styloid process 18.7  Across hand at thumb web space 20  At base of 2nd digit 6.7  (Blank rows = not tested)   PATIENT EDUCATION:  Education details: *** Person educated: {Person educated:25204} Education method: {Education Method:25205} Education comprehension: {Education Comprehension:25206}   HOME EXERCISE PROGRAM:  Reviewed previously given post op HEP. ***  ASSESSMENT:  CLINICAL IMPRESSION: ***  Pt will benefit from skilled therapeutic intervention to improve on the following deficits: Decreased knowledge of precautions, impaired UE functional use, pain, decreased ROM, postural dysfunction.   PT treatment/interventions: ADL/Self care home management, {rehab planned interventions:25118::"Therapeutic exercises","Therapeutic activity","Neuromuscular re-education","Balance training","Gait training","Patient/Family education","Joint mobilization"}     GOALS: Goals reviewed with patient? {yes/no:20286}  LONG TERM GOALS:  (STG=LTG)  GOALS Name Target Date  Goal status  1 Pt will demonstrate she has regained full shoulder ROM and function post operatively compared to baselines.  Baseline: {follow up:25551} {GOALSTATUS:25110}  2  {follow up:25551} {GOALSTATUS:25110}  3  {follow up:25551} {GOALSTATUS:25110}  4  {follow up:25551} {GOALSTATUS:25110}     PLAN: PT FREQUENCY/DURATION: ***  PLAN FOR NEXT SESSION: ***   [facility]  [address]  [place] [address]  [phone]  After Breast Cancer Class It is recommended you attend the ABC class to be educated on lymphedema risk reduction. This class is free of charge and lasts for 1 hour. It is a 1-time class. You will need to download the Webex app either on your phone or computer. We will send you a link the night before or the morning of the class. You should be able to click on that link to join the class. This is not a  confidential class. You don't have to turn your camera on, but other participants may be able to see your email address.  Scar massage You can begin  gentle scar massage to you incision sites. Gently place one hand on the incision and move the skin (without sliding on the skin) in various directions. Do this for a few minutes and then you can gently massage either coconut oil or vitamin E cream into the scars.  Compression garment You should continue wearing your compression bra until you feel like you no longer have swelling.  Home exercise Program Continue doing the exercises you were given until you feel like you can do them without feeling any tightness at the end.   Walking Program Studies show that 30 minutes of walking per day (fast enough to elevate your heart rate) can significantly reduce the risk of a cancer recurrence. If you can't walk due to other medical reasons, we encourage you to find another activity you could do (like a stationary bike or water exercise).  Posture After breast cancer surgery, people frequently sit with rounded shoulders posture because it puts their incisions on slack and feels better. If you sit like this and scar tissue forms in that position, you can become very tight and have pain sitting or standing with good posture. Try to be aware of your posture and sit and stand up tall to heal properly.  Follow up PT: It is recommended you return every 3 months for the first 3 years following surgery to be assessed on the SOZO machine for an L-Dex score. This helps prevent clinically significant lymphedema in 95% of patients. These follow up screens are 10 minute appointments that you are not billed for.  Maria Bray, PT 11/15/2021, 10:56 PM

## 2021-11-16 ENCOUNTER — Ambulatory Visit: Payer: Medicare Other | Admitting: Rehabilitation

## 2021-11-18 ENCOUNTER — Inpatient Hospital Stay: Payer: Medicare Other

## 2021-11-18 ENCOUNTER — Other Ambulatory Visit: Payer: Self-pay

## 2021-11-18 ENCOUNTER — Inpatient Hospital Stay (HOSPITAL_BASED_OUTPATIENT_CLINIC_OR_DEPARTMENT_OTHER): Payer: Medicare Other | Admitting: Genetic Counselor

## 2021-11-18 ENCOUNTER — Encounter: Payer: Self-pay | Admitting: Genetic Counselor

## 2021-11-18 DIAGNOSIS — Z853 Personal history of malignant neoplasm of breast: Secondary | ICD-10-CM | POA: Diagnosis not present

## 2021-11-18 DIAGNOSIS — D0512 Intraductal carcinoma in situ of left breast: Secondary | ICD-10-CM | POA: Diagnosis not present

## 2021-11-18 DIAGNOSIS — Z803 Family history of malignant neoplasm of breast: Secondary | ICD-10-CM

## 2021-11-18 LAB — GENETIC SCREENING ORDER

## 2021-11-18 NOTE — Progress Notes (Signed)
REFERRING PROVIDER: Nicholas Lose, MD [place], [place] [address]  PRIMARY PROVIDER:  [name], MD  PRIMARY REASON FOR VISIT:  1. Ductal carcinoma in situ (DCIS) of left breast   2. Family history of breast cancer     HISTORY OF PRESENT ILLNESS:   Maria Bolton, a 76 y.o. female, was seen for a [place] cancer genetics consultation at the request of Dr. [name] due to a personal and family history of cancer.  Maria Bolton presents to clinic today to discuss the possibility of a hereditary predisposition to cancer, to discuss genetic testing, and to further clarify her future cancer risks, as well as potential cancer risks for family members.   Maria Bolton was diagnosed with DCIS of the left breast at age 49 and [place] of the left breast at age 19. She had negative BRCA1/2 genetic testing in 2012.   CANCER HISTORY:  Oncology History  Ductal carcinoma in situ (DCIS) of left breast  05/16/2011 Initial Biopsy   Left DCIS status postlumpectomy: Intermediate grade, 0.17 cm, ER 99%, Tis N0 status post radiation and tamoxifen completed April 2019   10/26/2021 Surgery   Left lumpectomy: Grade 3 ILC 2.1 cm, 0/4 lymph nodes negative, ER 100% PR 50%, HER2 equivocal by IHC positive by FISH ratio 3.4, Ki-67 15%   Malignant neoplasm of lower-inner quadrant of left breast in female, estrogen receptor positive ([place])  05/16/2011 Initial Biopsy   Left DCIS status postlumpectomy: Intermediate grade, 0.17 cm, ER 99%, Tis N0 status post radiation and tamoxifen completed April 2019   10/06/2021 Initial Diagnosis   10/06/2021: Screening mammogram detected 0.5 cm left breast mass LIQ: Pleomorphic ILC grade 3, ER 100%, PR 50%, Ki-67 15%, HER2 2+ by IHC, FISH HER2 positive ratio 3.4, copy #5.95   10/26/2021 Surgery   Left lumpectomy: Grade 3 ILC 2.1 cm, 0/4 lymph nodes negative, ER 100% PR 50%, HER2 equivocal by IHC positive by FISH ratio 3.4, Ki-67 15%     Past Medical History:  Diagnosis Date    Breast cancer ([place]) 04/2011   DCIS left breast , ER/PR+   Chronic diastolic heart failure ([place])    Difficulty sleeping    since dx of cancer   Hyperlipidemia    Hypertension 05/11/2011   Memory loss     Past Surgical History:  Procedure Laterality Date   BREAST LUMPECTOMY  05/16/2011   Procedure: LUMPECTOMY;  Surgeon: Shann Medal, MD;  Location: [facility];  Service: General;  Laterality: Left;  Needle localization Left breast lumpectomy   BREAST LUMPECTOMY WITH RADIOACTIVE SEED AND SENTINEL LYMPH NODE BIOPSY Left 10/26/2021   Procedure: LEFT BREAST LUMPECTOMY WITH RADIOACTIVE SEED AND SENTINEL LYMPH NODE BIOPSY;  Surgeon: Stark Klein, MD;  Location: [facility];  Service: General;  Laterality: Left;   [place]  05/16/2011   Procedure: NEVUS EXCISION;  Surgeon: Shann Medal, MD;  Location: [facility];  Service: General;  Laterality: Left;  remove mole left breast    Social History   Socioeconomic History   Marital status: Married    Spouse name: Not on file   Number of children: 1   Years of education: HS   Highest education level: Not on file  Occupational History   Occupation: Retired  Tobacco Use   Smoking status: Former    Packs/day: 1.00    Years: 46.00    Total pack years: 46.00  Types: Cigarettes    Quit date: 11/04/2009    Years since quitting: 12.0   Smokeless tobacco: Never  Vaping Use   Vaping Use: Never used  Substance and Sexual Activity   Alcohol use: No   Drug use: No   Sexual activity: Yes  Other Topics Concern   Not on file  Social History Narrative   Lives at home with husband.   Right-handed.   No caffeine use.   Social Determinants of Health   Financial Resource Strain: Not on file  Food Insecurity: Not on file  Transportation Needs: Not on file  Physical Activity: Not on file  Stress: Not on file  Social Connections: Not on file     FAMILY HISTORY:  We  obtained a detailed, 4-generation family history.  Significant diagnoses are listed below: Family History  Problem Relation Age of Onset   Cancer Mother 49       breast ca   Alzheimer's disease Father    Breast cancer Maternal Aunt    Breast cancer Cousin       Maria Bolton mother was diagnosed with breast cancer at age 47, she later died due to metastatic breast cancer. Her maternal aunt was diagnosed with breast cancer at an unknown age, she is deceased. Her maternal cousin (daughter of her aunt) was diagnosed with breast cancer at an unknown age. Maria Bolton is unaware of previous family history of genetic testing for hereditary cancer risks. There is no reported Ashkenazi Jewish ancestry.   GENETIC COUNSELING ASSESSMENT: Maria Bolton is a 76 y.o. female with a personal and family history of cancer which is somewhat suggestive of a hereditary predisposition to cancer. We, therefore, discussed and recommended the following at today's visit.   DISCUSSION: We discussed that 5 - 10% of cancer is hereditary, with most cases of breast cancer associated with BRCA1/2.  There are other genes that can be associated with hereditary breast cancer syndromes.  We discussed that testing is beneficial for several reasons including knowing how to follow individuals after completing their treatment, identifying whether potential treatment options would be beneficial, and understanding if other family members could be at risk for cancer and allowing them to undergo genetic testing.   We reviewed the characteristics, features and inheritance patterns of hereditary cancer syndromes. We also discussed genetic testing, including the appropriate family members to test, the process of testing, insurance coverage and turn-around-time for results. We discussed the implications of a negative, positive, carrier and/or variant of uncertain significant result. We recommended Maria Bolton pursue genetic testing for a panel that includes  genes associated with breast cancer.   Maria Bolton  was offered a common hereditary cancer panel (36 genes) and an expanded pan-cancer panel (77 genes). Maria Bolton was informed of the benefits and limitations of each panel, including that expanded pan-cancer panels contain genes that do not have clear management guidelines at this point in time.  We also discussed that as the number of genes included on a panel increases, the chances of variants of uncertain significance increases.  After considering the benefits and limitations of each gene panel, Maria Bolton elected to have [facility] Panel.  The CancerNext gene panel offered by [employer] includes sequencing, rearrangement analysis, and RNA analysis for the following 36 genes:   APC, ATM, AXIN2, BARD1, BMPR1A, BRCA1, BRCA2, BRIP1, CDH1, CDK4, CDKN2A, CHEK2, DICER1, HOXB13, EPCAM, GREM1, MLH1, MSH2, MSH3, MSH6, MUTYH, NBN, NF1, NTHL1, PALB2, PMS2, POLD1, POLE, PTEN, RAD51C, RAD51D, RECQL, SMAD4,  SMARCA4, STK11, and TP53.   Based on Maria Bolton personal and family history of cancer, she meets medical criteria for genetic testing. Despite that she meets criteria, she may still have an out of pocket cost. We discussed that if her out of pocket cost for testing is over $100, the laboratory will call and confirm whether she wants to proceed with testing.  If the out of pocket cost of testing is less than $100 she will be billed by the genetic testing laboratory.   PLAN: After considering the risks, benefits, and limitations, Maria Bolton provided informed consent to pursue genetic testing and the blood sample was sent to [facility] for analysis of the CancerNext Panel. Results should be available within approximately 2-3 weeks' time, at which point they will be disclosed by telephone to Maria Bolton, as will any additional recommendations warranted by these results. Maria Bolton will receive a summary of her genetic counseling visit and a copy of her results once  available. This information will also be available in Epic.   Maria Bolton questions were answered to her satisfaction today. Our contact information was provided should additional questions or concerns arise. Thank you for the referral and allowing [place] to share in the care of your patient.   Lucille Passy, MS, [facility] Genetic Counselor [email or web address] (P) [phone]  The patient was seen for a total of 25 minutes in face-to-face genetic counseling.  The patient brought her husband.  Drs. [name] and/or Burr Medico were available to discuss this case as needed.   _______________________________________________________________________ For Office Staff:  Number of people involved in session: 2 Was an Intern/ student involved with case: no

## 2021-12-03 ENCOUNTER — Telehealth: Payer: Self-pay | Admitting: Genetic Counselor

## 2021-12-03 ENCOUNTER — Encounter: Payer: Self-pay | Admitting: Genetic Counselor

## 2021-12-03 DIAGNOSIS — M1712 Unilateral primary osteoarthritis, left knee: Secondary | ICD-10-CM | POA: Diagnosis not present

## 2021-12-03 DIAGNOSIS — Z1379 Encounter for other screening for genetic and chromosomal anomalies: Secondary | ICD-10-CM | POA: Insufficient documentation

## 2021-12-03 NOTE — Telephone Encounter (Signed)
I attempted to contact [name] to discuss her genetic testing results (36 genes). I left a voicemail requesting she call me back at [phone].  Lucille Passy, MS,  [facility] Genetic Counselor [email or web address] (P) [phone]

## 2021-12-09 DIAGNOSIS — I10 Essential (primary) hypertension: Secondary | ICD-10-CM | POA: Diagnosis not present

## 2021-12-09 DIAGNOSIS — E890 Postprocedural hypothyroidism: Secondary | ICD-10-CM | POA: Diagnosis not present

## 2021-12-09 DIAGNOSIS — R7309 Other abnormal glucose: Secondary | ICD-10-CM | POA: Diagnosis not present

## 2021-12-10 ENCOUNTER — Telehealth: Payer: Self-pay | Admitting: Genetic Counselor

## 2021-12-10 NOTE — Telephone Encounter (Signed)
I contacted Ms. Latner to discuss her genetic testing results. No pathogenic variants were identified in the 36 genes analyzed. Detailed clinic note to follow.  The test report has been scanned into EPIC and is located under the Molecular Pathology section of the Results Review tab.  A portion of the result report is included below for reference.   Lucille Passy, MS, [facility] Genetic Counselor [email or web address] (P) [phone]

## 2021-12-13 ENCOUNTER — Ambulatory Visit: Payer: Self-pay | Admitting: Genetic Counselor

## 2021-12-13 DIAGNOSIS — Z1379 Encounter for other screening for genetic and chromosomal anomalies: Secondary | ICD-10-CM

## 2021-12-13 DIAGNOSIS — I890 Lymphedema, not elsewhere classified: Secondary | ICD-10-CM | POA: Insufficient documentation

## 2021-12-13 NOTE — Progress Notes (Signed)
HPI:   Maria Bolton was previously seen in the [place] clinic due to a personal and family history of cancer and concerns regarding a hereditary predisposition to cancer. Please refer to our prior cancer genetics clinic note for more information regarding our discussion, assessment and recommendations, at the time. Maria Bolton recent genetic test results were disclosed to her, as were recommendations warranted by these results. These results and recommendations are discussed in more detail below.  CANCER HISTORY:  Oncology History  Ductal carcinoma in situ (DCIS) of left breast  05/16/2011 Initial Biopsy   Left DCIS status postlumpectomy: Intermediate grade, 0.17 cm, ER 99%, Tis N0 status post radiation and tamoxifen completed April 2019   10/26/2021 Surgery   Left lumpectomy: Grade 3 ILC 2.1 cm, 0/4 lymph nodes negative, ER 100% PR 50%, HER2 equivocal by IHC positive by FISH ratio 3.4, Ki-67 15%    Genetic Testing   [facility] CancerNext Panel was Negative. Report date is 11/30/2021.  The CancerNext gene panel offered by [employer] includes sequencing, rearrangement analysis, and RNA analysis for the following 36 genes:   APC, ATM, AXIN2, BARD1, BMPR1A, BRCA1, BRCA2, BRIP1, CDH1, CDK4, CDKN2A, CHEK2, DICER1, HOXB13, EPCAM, GREM1, MLH1, MSH2, MSH3, MSH6, MUTYH, NBN, NF1, NTHL1, PALB2, PMS2, POLD1, POLE, PTEN, RAD51C, RAD51D, RECQL, SMAD4, SMARCA4, STK11, and TP53.    Malignant neoplasm of lower-inner quadrant of left breast in female, estrogen receptor positive ([place])  05/16/2011 Initial Biopsy   Left DCIS status postlumpectomy: Intermediate grade, 0.17 cm, ER 99%, Tis N0 status post radiation and tamoxifen completed April 2019   10/06/2021 Initial Diagnosis   10/06/2021: Screening mammogram detected 0.5 cm left breast mass LIQ: Pleomorphic ILC grade 3, ER 100%, PR 50%, Ki-67 15%, HER2 2+ by IHC, FISH HER2 positive ratio 3.4, copy #5.95   10/26/2021 Surgery   Left lumpectomy: Grade 3  ILC 2.1 cm, 0/4 lymph nodes negative, ER 100% PR 50%, HER2 equivocal by IHC positive by FISH ratio 3.4, Ki-67 15%     FAMILY HISTORY:  We obtained a detailed, 4-generation family history.  Significant diagnoses are listed below:      Family History  Problem Relation Age of Onset   Cancer Mother 75        breast ca   Alzheimer's disease Father     Breast cancer Maternal Aunt     Breast cancer Cousin           Maria Bolton mother was diagnosed with breast cancer at age 47, she later died due to metastatic breast cancer. Her maternal aunt was diagnosed with breast cancer at an unknown age, she is deceased. Her maternal cousin (daughter of her aunt) was diagnosed with breast cancer at an unknown age. Maria Bolton is unaware of previous family history of genetic testing for hereditary cancer risks. There is no reported Ashkenazi Jewish ancestry.   GENETIC TEST RESULTS:  The [facility] CancerNext Panel found no pathogenic mutations.  The CancerNext gene panel offered by [employer] includes sequencing, rearrangement analysis, and RNA analysis for the following 36 genes:   APC, ATM, AXIN2, BARD1, BMPR1A, BRCA1, BRCA2, BRIP1, CDH1, CDK4, CDKN2A, CHEK2, DICER1, HOXB13, EPCAM, GREM1, MLH1, MSH2, MSH3, MSH6, MUTYH, NBN, NF1, NTHL1, PALB2, PMS2, POLD1, POLE, PTEN, RAD51C, RAD51D, RECQL, SMAD4, SMARCA4, STK11, and TP53.   The test report has been scanned into EPIC and is located under the Molecular Pathology section of the Results Review tab.  A portion of the result report is included below for  reference. Genetic testing reported out on 11/30/2021.       Even though a pathogenic variant was not identified, possible explanations for the cancer in the family may include: There may be no hereditary risk for cancer in the family. The cancers in Maria Bolton and/or her family may be due to other genetic or environmental factors. There may be a gene mutation in one of these genes that current testing methods  cannot detect, but that chance is small. There could be another gene that has not yet been discovered, or that we have not yet tested, that is responsible for the cancer diagnoses in the family.  It is also possible there is a hereditary cause for the cancer in the family that Maria Bolton did not inherit.  Therefore, it is important to remain in touch with cancer genetics in the future so that we can continue to offer Maria Bolton the most up to date genetic testing.   ADDITIONAL GENETIC TESTING:  We discussed with Maria Bolton that her genetic testing was fairly extensive.  If there are genes identified to increase cancer risk that can be analyzed in the future, we would be happy to discuss and coordinate this testing at that time.    CANCER SCREENING RECOMMENDATIONS:  Maria Bolton test result is considered negative (normal).  This means that we have not identified a hereditary cause for her personal and family history of cancer at this time.   An individual's cancer risk and medical management are not determined by genetic test results alone. Overall cancer risk assessment incorporates additional factors, including personal medical history, family history, and any available genetic information that may result in a personalized plan for cancer prevention and surveillance. Therefore, it is recommended she continue to follow the cancer management and screening guidelines provided by her oncology and primary healthcare provider.  RECOMMENDATIONS FOR FAMILY MEMBERS:   Since she did not inherit a mutation in a cancer predisposition gene included on this panel, her son could not have inherited a mutation from her in one of these genes. Individuals in this family might be at some increased risk of developing cancer, over the general population risk, due to the family history of cancer. We recommend women in this family have a yearly mammogram beginning at age 34, or 94 years younger than the earliest onset of cancer,  an annual clinical breast exam, and perform monthly breast self-exams.  FOLLOW-UP:  Cancer genetics is a rapidly advancing field and it is possible that new genetic tests will be appropriate for her and/or her family members in the future. We encouraged her to remain in contact with cancer genetics on an annual basis so we can update her personal and family histories and let her know of advances in cancer genetics that may benefit this family.   Our contact number was provided. Maria Bolton questions were answered to her satisfaction, and she knows she is welcome to call us at anytime with additional questions or concerns.   Lucille Passy, MS, [facility] Genetic Counselor [email or web address] (P) [phone]

## 2021-12-16 DIAGNOSIS — R7309 Other abnormal glucose: Secondary | ICD-10-CM | POA: Diagnosis not present

## 2021-12-16 DIAGNOSIS — E890 Postprocedural hypothyroidism: Secondary | ICD-10-CM | POA: Diagnosis not present

## 2021-12-16 DIAGNOSIS — I129 Hypertensive chronic kidney disease with stage 1 through stage 4 chronic kidney disease, or unspecified chronic kidney disease: Secondary | ICD-10-CM | POA: Diagnosis not present

## 2021-12-17 ENCOUNTER — Ambulatory Visit: Payer: Medicare Other | Admitting: Rehabilitation

## 2021-12-20 DIAGNOSIS — M1711 Unilateral primary osteoarthritis, right knee: Secondary | ICD-10-CM | POA: Insufficient documentation

## 2021-12-22 ENCOUNTER — Encounter: Payer: Self-pay | Admitting: Genetic Counselor

## 2021-12-23 ENCOUNTER — Ambulatory Visit: Payer: Medicare Other | Attending: Hematology and Oncology | Admitting: Rehabilitation

## 2021-12-23 ENCOUNTER — Encounter: Payer: Self-pay | Admitting: Rehabilitation

## 2021-12-23 DIAGNOSIS — C50312 Malignant neoplasm of lower-inner quadrant of left female breast: Secondary | ICD-10-CM | POA: Insufficient documentation

## 2021-12-23 DIAGNOSIS — I890 Lymphedema, not elsewhere classified: Secondary | ICD-10-CM | POA: Diagnosis not present

## 2021-12-23 DIAGNOSIS — Z483 Aftercare following surgery for neoplasm: Secondary | ICD-10-CM | POA: Diagnosis not present

## 2021-12-23 DIAGNOSIS — Z170 Estrogen receptor positive status [ER+]: Secondary | ICD-10-CM | POA: Diagnosis not present

## 2021-12-23 DIAGNOSIS — R293 Abnormal posture: Secondary | ICD-10-CM | POA: Insufficient documentation

## 2021-12-23 NOTE — Therapy (Signed)
OUTPATIENT PHYSICAL THERAPY BREAST CANCER REEVALUATION   Patient Name: Maria Bolton MRN: [id number] DOB:1945/08/18, 76 y.o., female Today's Date: 12/23/2021   PT End of Session - 12/23/21 0955     Visit Number 2    PT Start Time 1000    PT Stop Time 0233    PT Time Calculation (min) 29 min    Activity Tolerance Patient tolerated treatment well    Behavior During Therapy [facility] for tasks assessed/performed              Past Medical History:  Diagnosis Date   Breast cancer ([place]) 04/2011   DCIS left breast , ER/PR+   Chronic diastolic heart failure ([place])    Difficulty sleeping    since dx of cancer   Hyperlipidemia    Hypertension 05/11/2011   Memory loss    Past Surgical History:  Procedure Laterality Date   BREAST LUMPECTOMY  05/16/2011   Procedure: LUMPECTOMY;  Surgeon: Shann Medal, MD;  Location: [place];  Service: General;  Laterality: Left;  Needle localization Left breast lumpectomy   BREAST LUMPECTOMY WITH RADIOACTIVE SEED AND SENTINEL LYMPH NODE BIOPSY Left 10/26/2021   Procedure: LEFT BREAST LUMPECTOMY WITH RADIOACTIVE SEED AND SENTINEL LYMPH NODE BIOPSY;  Surgeon: Stark Klein, MD;  Location: [place];  Service: General;  Laterality: Left;   [place]  05/16/2011   Procedure: NEVUS EXCISION;  Surgeon: Shann Medal, MD;  Location: [place];  Service: General;  Laterality: Left;  remove mole left breast   Patient Active Problem List   Diagnosis Date Noted   Genetic testing 12/03/2021   Family history of breast cancer 11/18/2021   Malignant neoplasm of lower-inner quadrant of left breast in female, estrogen receptor positive ([place]) 10/18/2021   Dementia without behavioral disturbance ([place]) 02/02/2021   Abnormal thyroid blood test 12/27/2016   Abnormal glucose 09/06/2016   Mild cognitive impairment 09/06/2016   Fever 09/19/2015   Acute bronchitis 09/19/2015   Anxiety about health  09/19/2015   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   Hypertension 05/11/2011    PCP: [name], MD  REFERRING PROVIDER: Dr. Barry Dienes  REFERRING DIAG: C50.312,Z17.0 (ICD-10-CM) - Malignant neoplasm of lower-inner quadrant of left breast in female, estrogen receptor positive ([place]  THERAPY DIAG:  Malignant neoplasm of lower-inner quadrant of left breast in female, estrogen receptor positive ([place]) - Plan: PT plan of care cert/re-cert  Abnormal posture - Plan: PT plan of care cert/re-cert  Aftercare following surgery for neoplasm - Plan: PT plan of care cert/re-cert  Lymphedema, not elsewhere classified - Plan: PT plan of care cert/re-cert  ONSET DATE: 09/07/54  SUBJECTIVE  SUBJECTIVE STATEMENT: Pt denies pain or any breast problems but is confused by most questioning.  Husband reports it used to be painful but after the took out the fluid it feels better.    PERTINENT HISTORY:  Lt breast lumpectomy and SLNB for recurrent cancer on 10/26/21. Aspiration x 2 with use of antibiotics. 3 negative lymph nodes.  Hx of breast cancer on the same side in 2012 treated with lumpectomy, radiation, and endrocrine therapy.   PATIENT GOALS   what to do for continued swelling  PAIN:  Are you having pain? No   PRECAUTIONS: Rt lymphedema risk  HAND DOMINANCE: right  WEIGHT BEARING RESTRICTIONS No  FALLS:  Has patient fallen in last 6 months? Yes. Number of falls 4-5  LIVING ENVIRONMENT: Patient lives with: with spouse  Lives in: House/apartment Has following equipment at home: Single point cane  OCCUPATION: retired   LEISURE: pulling weeds, mows the yard   [place] FUNCTION: Independent   OBJECTIVE  COGNITION:  Overall cognitive status: impaired; dementia - does not remember  reasons for visit throughout and that surgery is taking place, etc.   POSTURE:  Forward head and rounded shoulders posture  OBSERVATION: Left breast well healed, smaller than the Rt side, and hx or radiation apparent.    PALPATION: fibrosis and increased scar tissue inferior breast  UPPER EXTREMITY AROM/PROM:  A/PROM LEFT  10/19/21 12/23/21  Shoulder extension 50   Shoulder flexion 150 140  Shoulder abduction 161 145  Shoulder internal rotation    Shoulder external rotation 85     (Blank rows = not tested)  LYMPHEDEMA ASSESSMENTS:  LANDMARK LEFT  10/19/21 12/23/21  10 cm proximal to olecranon process 39.5 39.5  Olecranon process 30.0 30  10 cm proximal to ulnar styloid process 25.3 25.3  Just proximal to ulnar styloid process 18.7 17.5  Across hand at thumb web space 20 19.5  At base of 2nd digit 6.7 6.3  (Blank rows = not tested)  Breast edema questionnaire: 0/80  PATIENT EDUCATION:  Education details: Lymphedema risk reduction and post op shoulder/posture HEP, per today's treatment note Person educated: Patient and spouse Education method: Explanation, Demonstration, Handout Education comprehension: Patient verbalized understanding and returned demonstration  ASSESSMENT:  CLINICAL IMPRESSION: Pt with dementia so care instruction mainly given to spouse throughout visit. Due to dementia pt was not taught self MLD but pt and husband were instructed to get a compression bra and swell spot for the bra with instruction on how to use this at home to decrease fibrosis.  Due to no complaints from the patient and difficulty with comprehension further inclinic treatments were also deferred.  Pts husband knows to let myself and Dr. Norman Clay know if it does become painful again.    Pt will benefit from skilled therapeutic intervention to improve on the following deficits: Decreased knowledge of precautions, impaired UE functional use, pain, decreased ROM, postural dysfunction, breast  lymphedema  PT treatment/interventions: ADL/self-care home management, pt/family education, therapeutic exercise  REHAB POTENTIAL: Good  CLINICAL DECISION MAKING: Stable/uncomplicated  EVALUATION COMPLEXITY: Low   GOALS: Goals reviewed with patient? YES  LONG TERM GOALS: (STG=LTG)   Name Target Date Goal status  1 Pt will be able to verbalize understanding of pertinent lymphedema risk reduction practices relevant to her dx specifically related to skin care.  Baseline:  No knowledge 10/19/2021 Achieved at eval  2 Pt will be able to return demo and/or verbalize understanding of the post op HEP related to regaining shoulder  ROM. Baseline:  No knowledge 10/19/2021 Achieved at eval  3 Pt will be able to verbalize understanding of the importance of attending the post op After Breast CA Class for further lymphedema risk reduction education and therapeutic exercise.  Baseline:  No knowledge 10/19/2021 Achieved at eval  4 Pt will demo she has regained full shoulder ROM and function post operatively compared to baselines.  Baseline: See objective measurements taken today. 11/16/21 MET  5 Pt will know where to get compression bra and swell spot 12/23/21 MET     PLAN: PT FREQUENCY/DURATION: SOZO only  PLAN FOR NEXT SESSION: SOZO only - check how bra is going on SOZO visit .       Stark Bray, PT 12/23/2021, 10:43 AM

## 2021-12-27 DIAGNOSIS — C50912 Malignant neoplasm of unspecified site of left female breast: Secondary | ICD-10-CM | POA: Diagnosis not present

## 2022-01-01 ENCOUNTER — Other Ambulatory Visit: Payer: Self-pay | Admitting: Cardiology

## 2022-01-01 DIAGNOSIS — I10 Essential (primary) hypertension: Secondary | ICD-10-CM

## 2022-01-08 ENCOUNTER — Other Ambulatory Visit: Payer: Self-pay | Admitting: Cardiology

## 2022-01-08 DIAGNOSIS — I10 Essential (primary) hypertension: Secondary | ICD-10-CM

## 2022-01-26 ENCOUNTER — Ambulatory Visit: Payer: Medicare Other | Attending: Hematology and Oncology | Admitting: Physical Therapy

## 2022-01-26 DIAGNOSIS — C50312 Malignant neoplasm of lower-inner quadrant of left female breast: Secondary | ICD-10-CM | POA: Insufficient documentation

## 2022-01-26 DIAGNOSIS — Z170 Estrogen receptor positive status [ER+]: Secondary | ICD-10-CM | POA: Insufficient documentation

## 2022-01-26 NOTE — Therapy (Signed)
  OUTPATIENT PHYSICAL THERAPY SOZO SCREENING NOTE   Patient Name: Maria Bolton MRN: [id number] DOB:Nov 09, 1945, 76 y.o., female Today's Date: 01/26/2022  PCP: [name], MD REFERRING PROVIDER: Stark Klein, MD   PT End of Session - 01/26/22 1300     Visit Number 2   unchanged due to screening only   Number of Visits 2    PT Start Time 1302    PT Stop Time 1308    PT Time Calculation (min) 6 min    Activity Tolerance Patient tolerated treatment well    Behavior During Therapy [facility] for tasks assessed/performed             Past Medical History:  Diagnosis Date   Breast cancer ([place]) 04/2011   DCIS left breast , ER/PR+   Chronic diastolic heart failure ([place])    Difficulty sleeping    since dx of cancer   Hyperlipidemia    Hypertension 05/11/2011   Memory loss    Past Surgical History:  Procedure Laterality Date   BREAST LUMPECTOMY  05/16/2011   Procedure: LUMPECTOMY;  Surgeon: Shann Medal, MD;  Location: [place];  Service: General;  Laterality: Left;  Needle localization Left breast lumpectomy   BREAST LUMPECTOMY WITH RADIOACTIVE SEED AND SENTINEL LYMPH NODE BIOPSY Left 10/26/2021   Procedure: LEFT BREAST LUMPECTOMY WITH RADIOACTIVE SEED AND SENTINEL LYMPH NODE BIOPSY;  Surgeon: Stark Klein, MD;  Location: [place];  Service: General;  Laterality: Left;   [place]  05/16/2011   Procedure: NEVUS EXCISION;  Surgeon: Shann Medal, MD;  Location: [place];  Service: General;  Laterality: Left;  remove mole left breast   Patient Active Problem List   Diagnosis Date Noted   Genetic testing 12/03/2021   Family history of breast cancer 11/18/2021   Malignant neoplasm of lower-inner quadrant of left breast in female, estrogen receptor positive ([place]) 10/18/2021   Dementia without behavioral disturbance ([place]) 02/02/2021   Abnormal thyroid blood test 12/27/2016   Abnormal glucose 09/06/2016    Mild cognitive impairment 09/06/2016   Fever 09/19/2015   Acute bronchitis 09/19/2015   Anxiety about health 09/19/2015   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   Hypertension 05/11/2011    REFERRING DIAG: left breast cancer at risk for lymphedema  THERAPY DIAG:  Malignant neoplasm of lower-inner quadrant of left breast in female, estrogen receptor positive ([place])  PERTINENT HISTORY: Lt breast lumpectomy and SLNB for recurrent cancer on 10/26/21. Aspiration x 2 with use of antibiotics. 3 negative lymph nodes.  Hx of breast cancer on the same side in 2012 treated with lumpectomy, radiation, and endrocrine therapy.   PRECAUTIONS: left UE Lymphedema risk,   SUBJECTIVE: Pt is here for 3 month SOZO screening.   PAIN:  Are you having pain? No  SOZO SCREENING: Patient was assessed today using the SOZO machine to determine the lymphedema index score. This was compared to her baseline score. It was determined that she is within the recommended range when compared to her baseline and no further action is needed at this time. She will continue SOZO screenings. These are done every 3 months for 2 years post operatively followed by every 6 months for 2 years, and then annually.     [facility] [place], PT 01/26/2022, 1:10 PM

## 2022-02-04 ENCOUNTER — Inpatient Hospital Stay: Payer: Medicare Other | Attending: Hematology and Oncology | Admitting: Adult Health

## 2022-03-03 ENCOUNTER — Other Ambulatory Visit: Payer: Self-pay | Admitting: Neurology

## 2022-05-01 ENCOUNTER — Other Ambulatory Visit: Payer: Self-pay | Admitting: Neurology

## 2022-05-02 ENCOUNTER — Ambulatory Visit: Payer: Medicare Other | Attending: Hematology and Oncology

## 2022-05-02 DIAGNOSIS — C50312 Malignant neoplasm of lower-inner quadrant of left female breast: Secondary | ICD-10-CM | POA: Insufficient documentation

## 2022-05-02 DIAGNOSIS — Z170 Estrogen receptor positive status [ER+]: Secondary | ICD-10-CM | POA: Insufficient documentation

## 2022-05-10 DIAGNOSIS — E890 Postprocedural hypothyroidism: Secondary | ICD-10-CM | POA: Diagnosis not present

## 2022-05-10 DIAGNOSIS — Z23 Encounter for immunization: Secondary | ICD-10-CM | POA: Diagnosis not present

## 2022-05-10 DIAGNOSIS — I2510 Atherosclerotic heart disease of native coronary artery without angina pectoris: Secondary | ICD-10-CM | POA: Diagnosis not present

## 2022-05-10 DIAGNOSIS — I129 Hypertensive chronic kidney disease with stage 1 through stage 4 chronic kidney disease, or unspecified chronic kidney disease: Secondary | ICD-10-CM | POA: Diagnosis not present

## 2022-05-10 DIAGNOSIS — D692 Other nonthrombocytopenic purpura: Secondary | ICD-10-CM | POA: Diagnosis not present

## 2022-05-10 DIAGNOSIS — Z0000 Encounter for general adult medical examination without abnormal findings: Secondary | ICD-10-CM | POA: Diagnosis not present

## 2022-05-10 DIAGNOSIS — R7309 Other abnormal glucose: Secondary | ICD-10-CM | POA: Diagnosis not present

## 2022-05-10 DIAGNOSIS — C50312 Malignant neoplasm of lower-inner quadrant of left female breast: Secondary | ICD-10-CM | POA: Diagnosis not present

## 2022-05-31 ENCOUNTER — Other Ambulatory Visit: Payer: Self-pay | Admitting: Neurology

## 2022-05-31 DIAGNOSIS — Z5181 Encounter for therapeutic drug level monitoring: Secondary | ICD-10-CM | POA: Diagnosis not present

## 2022-05-31 DIAGNOSIS — M170 Bilateral primary osteoarthritis of knee: Secondary | ICD-10-CM | POA: Diagnosis not present

## 2022-08-13 ENCOUNTER — Other Ambulatory Visit: Payer: Self-pay | Admitting: Neurology

## 2022-10-22 ENCOUNTER — Other Ambulatory Visit: Payer: Self-pay | Admitting: Neurology

## 2022-10-24 ENCOUNTER — Telehealth: Payer: Self-pay | Admitting: Neurology

## 2022-10-24 MED ORDER — MEMANTINE HCL 10 MG PO TABS: 10.0000 mg | ORAL_TABLET | Freq: Two times a day (BID) | ORAL | 4 refills | Status: DC

## 2022-10-24 MED ORDER — DONEPEZIL HCL 10 MG PO TABS: 10.0000 mg | ORAL_TABLET | Freq: Every day | ORAL | 3 refills | Status: DC

## 2022-10-24 NOTE — Telephone Encounter (Signed)
Meds ordered this encounter  Medications   donepezil (ARICEPT) 10 MG tablet    Sig: Take 1 tablet (10 mg total) by mouth at bedtime.    Dispense:  90 tablet    Refill:  3    Pt needs to make appt   memantine (NAMENDA) 10 MG tablet    Sig: Take 1 tablet (10 mg total) by mouth 2 (two) times daily.    Dispense:  180 tablet    Refill:  4    **Patient requests 90 days supply**     She may continue refills through her primary care if she is stable,

## 2022-10-24 NOTE — Addendum Note (Signed)
Addended by: Levert Feinstein on: 10/24/2022 03:16 PM   Modules accepted: Orders

## 2022-10-24 NOTE — Telephone Encounter (Signed)
Spouse has scheduled pt's 1 yr f/u, and pt is on wait list. Pt has been without Rx for a couple of days, spouse asking the memantine (NAMENDA) 10 MG tablet  be called into [facility] [phone]

## 2022-11-02 DIAGNOSIS — R350 Frequency of micturition: Secondary | ICD-10-CM | POA: Diagnosis not present

## 2022-11-15 DIAGNOSIS — Z79899 Other long term (current) drug therapy: Secondary | ICD-10-CM | POA: Diagnosis not present

## 2022-11-15 DIAGNOSIS — M170 Bilateral primary osteoarthritis of knee: Secondary | ICD-10-CM | POA: Diagnosis not present

## 2022-11-17 ENCOUNTER — Telehealth: Payer: Self-pay | Admitting: Hematology and Oncology

## 2022-11-17 ENCOUNTER — Other Ambulatory Visit: Payer: Self-pay | Admitting: Hematology and Oncology

## 2022-11-17 NOTE — Telephone Encounter (Signed)
Left patient a vm regarding upcoming appointment  

## 2022-12-01 DIAGNOSIS — R92323 Mammographic fibroglandular density, bilateral breasts: Secondary | ICD-10-CM | POA: Diagnosis not present

## 2022-12-01 DIAGNOSIS — Z853 Personal history of malignant neoplasm of breast: Secondary | ICD-10-CM | POA: Diagnosis not present

## 2022-12-06 DIAGNOSIS — M25511 Pain in right shoulder: Secondary | ICD-10-CM | POA: Diagnosis not present

## 2022-12-09 ENCOUNTER — Encounter: Payer: Self-pay | Admitting: Hematology and Oncology

## 2022-12-20 DIAGNOSIS — E559 Vitamin D deficiency, unspecified: Secondary | ICD-10-CM | POA: Diagnosis not present

## 2022-12-20 DIAGNOSIS — E890 Postprocedural hypothyroidism: Secondary | ICD-10-CM | POA: Diagnosis not present

## 2022-12-20 DIAGNOSIS — R7309 Other abnormal glucose: Secondary | ICD-10-CM | POA: Diagnosis not present

## 2022-12-20 DIAGNOSIS — I129 Hypertensive chronic kidney disease with stage 1 through stage 4 chronic kidney disease, or unspecified chronic kidney disease: Secondary | ICD-10-CM | POA: Diagnosis not present

## 2022-12-22 DIAGNOSIS — R350 Frequency of micturition: Secondary | ICD-10-CM | POA: Diagnosis not present

## 2023-02-14 ENCOUNTER — Inpatient Hospital Stay: Payer: Medicare Other | Attending: Hematology and Oncology | Admitting: Hematology and Oncology

## 2023-02-14 VITALS — BP 157/67 | HR 61 | Temp 97.7°F | Resp 18 | Ht 66.0 in | Wt 167.6 lb

## 2023-02-14 DIAGNOSIS — Z170 Estrogen receptor positive status [ER+]: Secondary | ICD-10-CM | POA: Diagnosis not present

## 2023-02-14 DIAGNOSIS — Z79810 Long term (current) use of selective estrogen receptor modulators (SERMs): Secondary | ICD-10-CM | POA: Diagnosis not present

## 2023-02-14 DIAGNOSIS — Z79899 Other long term (current) drug therapy: Secondary | ICD-10-CM | POA: Diagnosis not present

## 2023-02-14 DIAGNOSIS — C50312 Malignant neoplasm of lower-inner quadrant of left female breast: Secondary | ICD-10-CM | POA: Insufficient documentation

## 2023-02-14 DIAGNOSIS — D0512 Intraductal carcinoma in situ of left breast: Secondary | ICD-10-CM

## 2023-02-14 DIAGNOSIS — Z79811 Long term (current) use of aromatase inhibitors: Secondary | ICD-10-CM | POA: Diagnosis not present

## 2023-02-14 DIAGNOSIS — Z923 Personal history of irradiation: Secondary | ICD-10-CM | POA: Insufficient documentation

## 2023-02-14 MED ORDER — LETROZOLE 2.5 MG PO TABS: 2.5000 mg | ORAL_TABLET | Freq: Every day | ORAL | 3 refills | Status: DC

## 2023-02-14 NOTE — Progress Notes (Signed)
Patient Care Team: Merri Brunette, MD as PCP - General (Internal Medicine) Leta Speller, MD (Dermatology) Ovidio Kin, MD as Consulting Physician (General Surgery) Yates Decamp, MD as Consulting Physician (Cardiology) Pershing Proud, RN as Oncology Nurse Navigator Donnelly Angelica, RN as Oncology Nurse Navigator Serena Croissant, MD as Consulting Physician (Hematology and Oncology)  DIAGNOSIS:  Encounter Diagnosis  Name Primary?   Ductal carcinoma in situ (DCIS) of left breast Yes    SUMMARY OF ONCOLOGIC HISTORY: Oncology History  Ductal carcinoma in situ (DCIS) of left breast  05/16/2011 Initial Biopsy   Left DCIS status postlumpectomy: Intermediate grade, 0.17 cm, ER 99%, Tis N0 status post radiation and tamoxifen completed April 2019   10/26/2021 Surgery   Left lumpectomy: Grade 3 ILC 2.1 cm, 0/4 lymph nodes negative, ER 100% PR 50%, HER2 equivocal by IHC positive by FISH ratio 3.4, Ki-67 15%    Genetic Testing   [facility] CancerNext Panel was Negative. Report date is 11/30/2021.  The CancerNext gene panel offered by [employer] includes sequencing, rearrangement analysis, and RNA analysis for the following 36 genes:   APC, ATM, AXIN2, BARD1, BMPR1A, BRCA1, BRCA2, BRIP1, CDH1, CDK4, CDKN2A, CHEK2, DICER1, HOXB13, EPCAM, GREM1, MLH1, MSH2, MSH3, MSH6, MUTYH, NBN, NF1, NTHL1, PALB2, PMS2, POLD1, POLE, PTEN, RAD51C, RAD51D, RECQL, SMAD4, SMARCA4, STK11, and TP53.    10/2021 -  Anti-estrogen oral therapy   Letrozole x 5-7 years   Malignant neoplasm of lower-inner quadrant of left breast in female, estrogen receptor positive ([facility])  05/16/2011 Initial Biopsy   Left DCIS status postlumpectomy: Intermediate grade, 0.17 cm, ER 99%, Tis N0 status post radiation and tamoxifen completed April 2019   10/06/2021 Initial Diagnosis   10/06/2021: Screening mammogram detected 0.5 cm left breast mass LIQ: Pleomorphic ILC grade 3, ER 100%, PR 50%, Ki-67 15%, HER2 2+ by IHC, FISH HER2 positive ratio  3.4, copy #5.95   10/26/2021 Surgery   Left lumpectomy: Grade 3 ILC 2.1 cm, 0/4 lymph nodes negative, ER 100% PR 50%, HER2 equivocal by IHC positive by FISH ratio 3.4, Ki-67 15%   10/2021 -  Anti-estrogen oral therapy   Letrozole x 5-7 years     CHIEF COMPLIANT: Letrozole.   INTERVAL HISTORY: Maria Bolton is a 77 year old with above-mentioned history of DCIS currently on tamoxifen therapy.She presents to the clinic today for a follow-up. Patient reports that she is tolerating the letrozole. She denies any joint stiffness. She does have problems with her knees.  She had several off falls do to her balance been off. Denies any pain or discomfort in breast.   ALLERGIES:  has No Known Allergies.  MEDICATIONS:  Current Outpatient Medications  Medication Sig Dispense Refill   albuterol (VENTOLIN HFA) 108 (90 Base) MCG/ACT inhaler 2 puffs as needed     amLODipine (NORVASC) 5 MG tablet TAKE 1 TABLET(5 MG) BY MOUTH DAILY 90 tablet 3   cholecalciferol (VITAMIN D) 1000 units tablet Take 1,000 Units by mouth daily.     donepezil (ARICEPT) 10 MG tablet Take 1 tablet (10 mg total) by mouth at bedtime. 90 tablet 3   GEMTESA 75 MG TABS Take 1 tablet by mouth daily.     levothyroxine (SYNTHROID) 25 MCG tablet Take 25 mcg by mouth every morning.     losartan (COZAAR) 100 MG tablet Take 100 mg by mouth daily.     meloxicam (MOBIC) 15 MG tablet Take 15 mg by mouth daily.     memantine (NAMENDA) 10 MG tablet Take 1 tablet (  10 mg total) by mouth 2 (two) times daily. 180 tablet 4   oxyCODONE (OXY IR/ROXICODONE) 5 MG immediate release tablet Take 1 tablet (5 mg total) by mouth every 6 (six) hours as needed for severe pain. 15 tablet 0   PREVNAR 20 0.5 ML injection      rosuvastatin (CRESTOR) 10 MG tablet Take 10 mg by mouth daily.     Zoster Vaccine Adjuvanted [facility]) injection Inject 0.5 mLs into the muscle. 0.5 mL 1   letrozole (FEMARA) 2.5 MG tablet Take 1 tablet (2.5 mg total) by mouth daily. 90 tablet  3   metoprolol succinate (TOPROL-XL) 25 MG 24 hr tablet Take 1 tablet (25 mg total) by mouth daily. Take with or immediately following a meal. 90 tablet 3   No current facility-administered medications for this visit.    PHYSICAL EXAMINATION: ECOG PERFORMANCE STATUS: 1 - Symptomatic but completely ambulatory  Vitals:   02/14/23 1333  BP: (!) 157/67  Pulse: 61  Resp: 18  Temp: 97.7 F (36.5 C)  SpO2: 100%   Filed Weights   02/14/23 1333  Weight: 167 lb 9.6 oz (76 kg)      LABORATORY DATA:  I have reviewed the data as listed    Latest Ref Rng & Units 02/12/2019    2:55 PM 09/06/2016   11:09 AM 09/18/2015   11:29 PM  CMP  Glucose 70 - 99 mg/dL 098  94  119   BUN 8 - 23 mg/dL 12  19  12    Creatinine 0.44 - 1.00 mg/dL 1.47  8.29  5.62   Sodium 135 - 145 mmol/L 144  144  141   Potassium 3.5 - 5.1 mmol/L 3.7  3.8  3.6   Chloride 98 - 111 mmol/L 109  105  105   CO2 22 - 32 mmol/L 25  24    Calcium 8.9 - 10.3 mg/dL 9.3  9.2    Total Protein 6.5 - 8.1 g/dL 7.1  6.9    Total Bilirubin 0.3 - 1.2 mg/dL 0.6  0.2    Alkaline Phos 38 - 126 U/L 79  69    AST 15 - 41 U/L 19  24    ALT 0 - 44 U/L 12  20      Lab Results  Component Value Date   WBC 8.7 02/12/2019   HGB 13.1 02/12/2019   HCT 39.8 02/12/2019   MCV 85.6 02/12/2019   PLT 197 02/12/2019   NEUTROABS 4.6 02/12/2019    ASSESSMENT & PLAN:  Ductal carcinoma in situ (DCIS) of left breast 10/26/2021:Left lumpectomy: Grade 3 ILC 2.1 cm, 0/4 lymph nodes negative, ER 100% PR 50%, HER2 equivocal by IHC positive by FISH ratio 3.4, Ki-67 15%   Pathology counseling: I discussed the final pathology report of the patient provided  a copy of this report. I discussed the margins as well as lymph node surgeries. We also discussed the final staging along with previously performed ER/PR and HER-2/neu testing. She is not a candidate for radiation because of prior radiation therapy   Treatment plan: 1.  Ideally she needs adjuvant  chemotherapy with Herceptin but she is not able to handle any such treatments (severe dementia) 2. adjuvant antiestrogen therapy with letrozole  Letrozole toxicities: Tolerating the treatment extremely well without any problems or concerns.   Breast cancer surveillance: Mammogram 12/01/2022: Benign breast density category B at [facility]  Return to clinic in 1 year for follow-up    No orders of the  defined types were placed in this encounter.  The patient has a good understanding of the overall plan. she agrees with it. she will call with any problems that may develop before the next visit here. Total time spent: 30 mins including face to face time and time spent for planning, charting and co-ordination of care   Tamsen Meek, MD 02/14/23    I Janan Ridge am acting as a Neurosurgeon for [employer]  I have reviewed the above documentation for accuracy and completeness, and I agree with the above.

## 2023-02-14 NOTE — Assessment & Plan Note (Signed)
10/26/2021:Left lumpectomy: Grade 3 ILC 2.1 cm, 0/4 lymph nodes negative, ER 100% PR 50%, HER2 equivocal by IHC positive by FISH ratio 3.4, Ki-67 15%   Pathology counseling: I discussed the final pathology report of the patient provided  a copy of this report. I discussed the margins as well as lymph node surgeries. We also discussed the final staging along with previously performed ER/PR and HER-2/neu testing. She is not a candidate for radiation because of prior radiation therapy   Treatment plan: 1.  Ideally she needs adjuvant chemotherapy with Herceptin but she is not able to handle any such treatments (severe dementia) 2. adjuvant antiestrogen therapy with letrozole  Letrozole toxicities:   Breast cancer surveillance: Mammogram 12/01/2022: Benign breast density category B at [facility]  Return to clinic in 1 year for follow-up

## 2023-02-20 ENCOUNTER — Ambulatory Visit: Payer: Medicare Other | Admitting: Neurology

## 2023-02-20 ENCOUNTER — Encounter: Payer: Self-pay | Admitting: Neurology

## 2023-02-20 VITALS — BP 179/80 | HR 65 | Resp 17 | Ht 66.0 in | Wt 165.5 lb

## 2023-02-20 DIAGNOSIS — G301 Alzheimer's disease with late onset: Secondary | ICD-10-CM | POA: Diagnosis not present

## 2023-02-20 DIAGNOSIS — R269 Unspecified abnormalities of gait and mobility: Secondary | ICD-10-CM | POA: Diagnosis not present

## 2023-02-20 DIAGNOSIS — F02C0 Dementia in other diseases classified elsewhere, severe, without behavioral disturbance, psychotic disturbance, mood disturbance, and anxiety: Secondary | ICD-10-CM | POA: Diagnosis not present

## 2023-02-20 DIAGNOSIS — R531 Weakness: Secondary | ICD-10-CM | POA: Diagnosis not present

## 2023-02-20 MED ORDER — ALPRAZOLAM 0.5 MG PO TABS: ORAL_TABLET | ORAL | 0 refills | Status: DC

## 2023-02-20 MED ORDER — MEMANTINE HCL 10 MG PO TABS: 10.0000 mg | ORAL_TABLET | Freq: Two times a day (BID) | ORAL | 4 refills | Status: DC

## 2023-02-20 MED ORDER — DONEPEZIL HCL 10 MG PO TABS: 10.0000 mg | ORAL_TABLET | Freq: Every day | ORAL | 3 refills | Status: DC

## 2023-02-20 NOTE — Progress Notes (Signed)
Chief Complaint  Patient presents with   Memory Loss    Rm13, husband phillip present. Unable to complete moca but scored 13 on mmse      ASSESSMENT AND PLAN  Maria Bolton is a 77 y.o. female   Worsening dementia Acute onset right side difficulty since June 2024  Mini-Mental Status Examination sudden drop to 13 out of 30 compared to 27/30 in 2021, noticeable right arm and leg weakness, increased gait abnormality,  MRI of the brain to rule out left hemisphere stroke  Refill Namenda 10 mg twice a day, Aricept 10 mg daily  DIAGNOSTIC DATA (LABS, IMAGING, TESTING) - I reviewed patient records, labs, notes, testing and imaging myself where available.   MEDICAL HISTORY:  Maria Bolton, is 77 year old female accompanied by her husband, follow-up for worsening dementia,  I reviewed and summarized the referring note. PMHX. 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 Romero Belling in 2018 for anxiety, hypothyroidism, thyroid toxicosis, was treated with radioactive iodine in June 2018, for toxic multinodular goiter  Over the years, she has been taking Namenda 10 mg twice a day, Aricept 10 mg daily, lives at home with her husband, no agitation, but slow worsening memory loss, worsening multiple joints pain, limited mobility  She likes to kneel down on her lawn pull up the weeds.  Husband reported in March 2024, she had acute onset right arm weakness, increased gait abnormality, today's examination showed right arm and leg weakness,  PHYSICAL EXAM:    Vitals:   02/20/23 1308  BP: (!) 179/80  Pulse: 65  Resp: 17  Weight: 165 lb 8 oz (75.1 kg)  Height: 5\' 6"  (1.676 m)    Body mass index is 26.71 kg/m.  PHYSICAL EXAMNIATION:  Gen: NAD, conversant, well nourised, well groomed                     Cardiovascular: Regular rate rhythm, no peripheral edema, warm, nontender. Eyes: Conjunctivae clear without exudates or hemorrhage Neck: Supple, no carotid bruits. Pulmonary: Clear to auscultation bilaterally   NEUROLOGICAL EXAM:  MENTAL STATUS: Speech/cognition: Awake, compliant with examination,    02/20/2023    1:13 PM 02/03/2020   10:42 AM 05/20/2019    1:06 PM  MMSE - Mini Mental State Exam  Not completed:   --  Orientation to time 2 4 4   Orientation to Place 1 3 3   Registration 3 3 3   Attention/ Calculation 0 5 2  Recall 0 3 1  Language- name 2 objects 2 2 2   Language- repeat 1 1 1   Language- follow 3 step command 3 3 2   Language- follow 3 step command-comments   she grabbed it with her left hand  Language- read & follow direction 1 1 1   Write a sentence 0 1 1  Copy design 0 1 1  Total score 13 27 21     CRANIAL NERVES: CN II: Visual fields are full to confrontation. Pupils are round equal and briskly reactive to light. CN III, IV, VI: extraocular movement are normal. No ptosis. CN V: Facial sensation  is intact to light touch CN VII: Face is symmetric with normal eye closure  CN VIII: Hearing is normal to causal conversation. CN IX, X: Phonation is normal. CN XI: Head turning and shoulder shrug are intact  MOTOR: Fixation of right arm on rapid rotating movement, colder right arm, no significant tenderness of right shoulder, upper extremity on deep palpitation,  REFLEXES: Hyperreflexia of right upper extremity  SENSORY: Intact to light touch, pinprick and vibratory sensation are intact in fingers and toes.  COORDINATION: There is no trunk or limb dysmetria noted.  GAIT/STANCE: Rely on her cane, antalgic,  dragging right lower extremity   REVIEW OF SYSTEMS:  Full 14 system review of systems performed and notable only for as above All other review of systems were negative.   ALLERGIES: No Known Allergies  HOME MEDICATIONS: Current Outpatient Medications  Medication Sig Dispense Refill   albuterol (VENTOLIN HFA) 108 (90 Base) MCG/ACT inhaler 2 puffs as needed     amLODipine (NORVASC) 5 MG tablet TAKE 1 TABLET(5 MG) BY MOUTH DAILY 90 tablet 3   cholecalciferol (VITAMIN D) 1000 units tablet Take 1,000 Units by mouth daily.     donepezil (ARICEPT) 10 MG tablet Take 1 tablet (10 mg total) by mouth at bedtime. 90 tablet 3   GEMTESA 75 MG TABS Take 1 tablet by mouth daily.     letrozole (FEMARA) 2.5 MG tablet Take 1 tablet (2.5 mg total) by mouth daily. 90 tablet 3   levothyroxine (SYNTHROID) 25 MCG tablet Take 25 mcg by mouth every morning.     losartan (COZAAR) 100 MG tablet Take 100 mg by mouth daily.     meloxicam (MOBIC) 15 MG tablet Take 15 mg by mouth daily.     memantine (NAMENDA) 10 MG tablet Take 1 tablet (10 mg total) by mouth 2 (two) times daily. 180 tablet 4   metoprolol succinate (TOPROL-XL) 25 MG 24 hr tablet Take 1 tablet (25 mg total) by mouth daily. Take with or immediately following a meal. 90 tablet 3   oxyCODONE (OXY IR/ROXICODONE) 5 MG immediate release tablet Take 1 tablet (5 mg total) by mouth every 6 (six) hours as needed for severe pain. 15 tablet 0   PREVNAR 20 0.5 ML injection      rosuvastatin (CRESTOR) 10 MG tablet Take 10 mg by mouth daily.     Zoster Vaccine Adjuvanted [facility]) injection Inject 0.5 mLs into the muscle. 0.5 mL 1   No current facility-administered medications for this visit.    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 , ER/PR+   Chronic diastolic heart failure (HCC)    Difficulty sleeping    since dx of cancer   Hyperlipidemia    Hypertension 05/11/2011   Memory loss      PAST SURGICAL HISTORY: Past Surgical History:  Procedure Laterality Date   BREAST LUMPECTOMY  05/16/2011   Procedure: LUMPECTOMY;  Surgeon: Kandis Cocking, MD;  Location: [facility];  Service: General;  Laterality: Left;  Needle localization Left breast lumpectomy   BREAST LUMPECTOMY WITH RADIOACTIVE SEED AND SENTINEL LYMPH NODE BIOPSY Left 10/26/2021   Procedure: LEFT BREAST LUMPECTOMY WITH RADIOACTIVE SEED AND SENTINEL LYMPH NODE BIOPSY;  Surgeon: Almond Lint, MD;  Location: [facility];  Service: General;  Laterality: Left;   HIP SURGERY  1982   NEVUS EXCISION  05/16/2011   Procedure: NEVUS EXCISION;  Surgeon: Sandria Bales  Ezzard Standing, MD;  Location: [facility];  Service: General;  Laterality: Left;  remove mole left breast    FAMILY HISTORY: Family History  Problem Relation Age of Onset   Cancer Mother 43       breast ca   Alzheimer's disease Father    Breast cancer Maternal Aunt    Breast cancer Cousin     SOCIAL HISTORY: Social History   Socioeconomic History   Marital status: Married    Spouse name: Not on file   Number of children: 1   Years of education: HS   Highest education level: Not on file  Occupational History   Occupation: Retired  Tobacco Use   Smoking status: Former    Current packs/day: 0.00    Average packs/day: 1 pack/day for 46.0 years (46.0 ttl pk-yrs)    Types: Cigarettes    Start date: 11/05/1963    Quit date: 11/04/2009    Years since quitting: 13.3   Smokeless tobacco: Never  Vaping Use   Vaping status: Never Used  Substance and Sexual Activity   Alcohol use: No   Drug use: No   Sexual activity: Yes  Other Topics Concern   Not on file  Social History Narrative   Lives at home with husband.   Right-handed.   No caffeine use.   Social Determinants of Health   Financial Resource Strain: Not on file  Food Insecurity: Not on file  Transportation Needs: Not on file  Physical Activity: Not on file   Stress: Not on file  Social Connections: Not on file  Intimate Partner Violence: Not on file      Levert Feinstein, M.D. Ph.D.  [facility] [address], [address] [place], [place] [address] Ph: [phone] Fax: [phone]  CC:  Merri Brunette, MD [address] [place],  [place] [address]  Merri Brunette, MD

## 2023-02-22 ENCOUNTER — Telehealth: Payer: Self-pay | Admitting: Neurology

## 2023-02-22 NOTE — Telephone Encounter (Signed)
[employer] medicare NPR sent to GI [phone] 

## 2023-02-23 NOTE — Telephone Encounter (Signed)
Pt's husband called to check on referral for CT Scan

## 2023-02-28 ENCOUNTER — Ambulatory Visit
Admission: RE | Admit: 2023-02-28 | Discharge: 2023-02-28 | Disposition: A | Discharge status: Home / Self Care | Payer: Medicare Other | Source: Ambulatory Visit | Attending: Neurology | Admitting: Neurology

## 2023-02-28 DIAGNOSIS — G301 Alzheimer's disease with late onset: Secondary | ICD-10-CM | POA: Diagnosis not present

## 2023-02-28 DIAGNOSIS — F02C0 Dementia in other diseases classified elsewhere, severe, without behavioral disturbance, psychotic disturbance, mood disturbance, and anxiety: Secondary | ICD-10-CM

## 2023-02-28 DIAGNOSIS — R531 Weakness: Secondary | ICD-10-CM

## 2023-03-06 ENCOUNTER — Telehealth: Payer: Self-pay | Admitting: Neurology

## 2023-03-06 NOTE — Telephone Encounter (Signed)
Pt is requesting a refill for ALPRAZolam (XANAX) 0.5 MG tablet  .  Pharmacy: [facility] [phone]

## 2023-03-08 NOTE — Telephone Encounter (Signed)
Phone room: Please advise spouse that the medication was only given for the imaging and refills are not authorized since this was only circumstantial.  Thanks,  Production assistant, radio

## 2023-03-08 NOTE — Telephone Encounter (Signed)
Pt's husband called again wanting to fill the pt's ALPRAZolam (XANAX) 0.5 MG tablet Please advise.

## 2023-03-09 NOTE — Telephone Encounter (Signed)
Pt was called and once DPR confirmed phone rep relayed message to spouse.

## 2023-03-16 ENCOUNTER — Telehealth: Payer: Self-pay | Admitting: Neurology

## 2023-03-16 NOTE — Telephone Encounter (Signed)
Called and spoke with him about result of recent imaging. Advised patient to call back if anything else was needed

## 2023-03-16 NOTE — Telephone Encounter (Signed)
Pt's husband called wanting to know when the pt's CT Scan results will be available to discuss. Please advise.

## 2023-03-21 DIAGNOSIS — R296 Repeated falls: Secondary | ICD-10-CM | POA: Diagnosis not present

## 2023-03-21 DIAGNOSIS — M170 Bilateral primary osteoarthritis of knee: Secondary | ICD-10-CM | POA: Diagnosis not present

## 2023-03-21 DIAGNOSIS — Z79899 Other long term (current) drug therapy: Secondary | ICD-10-CM | POA: Diagnosis not present

## 2023-03-24 DIAGNOSIS — M25562 Pain in left knee: Secondary | ICD-10-CM | POA: Diagnosis not present

## 2023-03-28 NOTE — Progress Notes (Unsigned)
   Rubin Payor, PhD, LAT, ATC acting as a scribe for Clementeen Graham, MD.  Maria Bolton is a 77 y.o. female who presents to [facility] at [facility] today for L knee pain. She has suffered about 12 falls in the last 6 months. She suffers from severe dementia.  L Knee swelling: Mechanical symptoms: Aggravates: Treatments tried: hydrocodone  Pertinent review of systems: ***  Relevant historical information: ***   Exam:  There were no vitals taken for this visit. General: Well Developed, well nourished, and in no acute distress.   MSK: ***    Lab and Radiology Results No results found for this or any previous visit (from the past 72 hour(s)). No results found.     Assessment and Plan: 77 y.o. female with ***   PDMP not reviewed this encounter. No orders of the defined types were placed in this encounter.  No orders of the defined types were placed in this encounter.    Discussed warning signs or symptoms. Please see discharge instructions. Patient expresses understanding.   ***

## 2023-03-29 ENCOUNTER — Encounter: Payer: Self-pay | Admitting: Family Medicine

## 2023-03-29 ENCOUNTER — Ambulatory Visit: Payer: Medicare Other | Admitting: Family Medicine

## 2023-03-29 VITALS — BP 122/88 | HR 67 | Ht 66.0 in | Wt 165.0 lb

## 2023-03-29 DIAGNOSIS — M25562 Pain in left knee: Secondary | ICD-10-CM | POA: Diagnosis not present

## 2023-03-29 DIAGNOSIS — M170 Bilateral primary osteoarthritis of knee: Secondary | ICD-10-CM | POA: Diagnosis not present

## 2023-03-29 DIAGNOSIS — G8929 Other chronic pain: Secondary | ICD-10-CM | POA: Insufficient documentation

## 2023-03-29 DIAGNOSIS — M25561 Pain in right knee: Secondary | ICD-10-CM | POA: Diagnosis not present

## 2023-03-29 NOTE — Patient Instructions (Signed)
Thank you for coming in today.   We will work on Teacher, adult education and give those shots or a regular cortisone injection in the near future.   If that does not work well enough next step would the be the embolization we talked about with radiology.

## 2023-03-30 ENCOUNTER — Telehealth: Payer: Self-pay

## 2023-03-30 NOTE — Telephone Encounter (Signed)
-----   Message from [facility] sent at 03/29/2023 12:54 PM EDT ----- Regarding: Zilretta Check benefits for [facility]

## 2023-03-30 NOTE — Telephone Encounter (Signed)
VOB initiated for Zilretta for BILAT knee OA 

## 2023-04-03 DIAGNOSIS — M1712 Unilateral primary osteoarthritis, left knee: Secondary | ICD-10-CM | POA: Diagnosis not present

## 2023-04-03 DIAGNOSIS — M25562 Pain in left knee: Secondary | ICD-10-CM | POA: Diagnosis not present

## 2023-04-03 DIAGNOSIS — Z79891 Long term (current) use of opiate analgesic: Secondary | ICD-10-CM | POA: Diagnosis not present

## 2023-04-03 DIAGNOSIS — E039 Hypothyroidism, unspecified: Secondary | ICD-10-CM | POA: Diagnosis not present

## 2023-04-03 DIAGNOSIS — G3184 Mild cognitive impairment, so stated: Secondary | ICD-10-CM | POA: Diagnosis not present

## 2023-04-03 DIAGNOSIS — R296 Repeated falls: Secondary | ICD-10-CM | POA: Diagnosis not present

## 2023-04-03 DIAGNOSIS — G8929 Other chronic pain: Secondary | ICD-10-CM | POA: Diagnosis not present

## 2023-04-03 DIAGNOSIS — M1711 Unilateral primary osteoarthritis, right knee: Secondary | ICD-10-CM | POA: Diagnosis not present

## 2023-04-03 DIAGNOSIS — M25561 Pain in right knee: Secondary | ICD-10-CM | POA: Diagnosis not present

## 2023-04-03 DIAGNOSIS — Z853 Personal history of malignant neoplasm of breast: Secondary | ICD-10-CM | POA: Diagnosis not present

## 2023-04-03 NOTE — Telephone Encounter (Signed)
ZILRETTA for BILAT knee OA   Primary Insurance: UHC Medicare Adv HMO/POS Co-pay: $20 Co-insurance: 20% Deductible: does not apply Prior Auth: NOT required   Knee Injection History

## 2023-04-03 NOTE — Telephone Encounter (Signed)
Prior Auth NOT required for [employer]

## 2023-04-04 NOTE — Telephone Encounter (Signed)
Left message for patient to call back to schedule. We will need to order.

## 2023-04-06 DIAGNOSIS — M25561 Pain in right knee: Secondary | ICD-10-CM | POA: Diagnosis not present

## 2023-04-06 DIAGNOSIS — G3184 Mild cognitive impairment, so stated: Secondary | ICD-10-CM | POA: Diagnosis not present

## 2023-04-06 DIAGNOSIS — M1711 Unilateral primary osteoarthritis, right knee: Secondary | ICD-10-CM | POA: Diagnosis not present

## 2023-04-06 DIAGNOSIS — M1712 Unilateral primary osteoarthritis, left knee: Secondary | ICD-10-CM | POA: Diagnosis not present

## 2023-04-06 DIAGNOSIS — Z853 Personal history of malignant neoplasm of breast: Secondary | ICD-10-CM | POA: Diagnosis not present

## 2023-04-06 DIAGNOSIS — R296 Repeated falls: Secondary | ICD-10-CM | POA: Diagnosis not present

## 2023-04-06 DIAGNOSIS — Z79891 Long term (current) use of opiate analgesic: Secondary | ICD-10-CM | POA: Diagnosis not present

## 2023-04-06 DIAGNOSIS — E039 Hypothyroidism, unspecified: Secondary | ICD-10-CM | POA: Diagnosis not present

## 2023-04-06 DIAGNOSIS — M25562 Pain in left knee: Secondary | ICD-10-CM | POA: Diagnosis not present

## 2023-04-06 DIAGNOSIS — G8929 Other chronic pain: Secondary | ICD-10-CM | POA: Diagnosis not present

## 2023-04-10 DIAGNOSIS — G3184 Mild cognitive impairment, so stated: Secondary | ICD-10-CM | POA: Diagnosis not present

## 2023-04-10 DIAGNOSIS — M1712 Unilateral primary osteoarthritis, left knee: Secondary | ICD-10-CM | POA: Diagnosis not present

## 2023-04-10 DIAGNOSIS — R296 Repeated falls: Secondary | ICD-10-CM | POA: Diagnosis not present

## 2023-04-10 DIAGNOSIS — E039 Hypothyroidism, unspecified: Secondary | ICD-10-CM | POA: Diagnosis not present

## 2023-04-10 DIAGNOSIS — M25562 Pain in left knee: Secondary | ICD-10-CM | POA: Diagnosis not present

## 2023-04-10 DIAGNOSIS — M1711 Unilateral primary osteoarthritis, right knee: Secondary | ICD-10-CM | POA: Diagnosis not present

## 2023-04-10 DIAGNOSIS — M25561 Pain in right knee: Secondary | ICD-10-CM | POA: Diagnosis not present

## 2023-04-10 DIAGNOSIS — G8929 Other chronic pain: Secondary | ICD-10-CM | POA: Diagnosis not present

## 2023-04-10 DIAGNOSIS — Z79891 Long term (current) use of opiate analgesic: Secondary | ICD-10-CM | POA: Diagnosis not present

## 2023-04-10 DIAGNOSIS — Z853 Personal history of malignant neoplasm of breast: Secondary | ICD-10-CM | POA: Diagnosis not present

## 2023-04-12 DIAGNOSIS — M1712 Unilateral primary osteoarthritis, left knee: Secondary | ICD-10-CM | POA: Diagnosis not present

## 2023-04-12 DIAGNOSIS — Z853 Personal history of malignant neoplasm of breast: Secondary | ICD-10-CM | POA: Diagnosis not present

## 2023-04-12 DIAGNOSIS — E039 Hypothyroidism, unspecified: Secondary | ICD-10-CM | POA: Diagnosis not present

## 2023-04-12 DIAGNOSIS — R296 Repeated falls: Secondary | ICD-10-CM | POA: Diagnosis not present

## 2023-04-12 DIAGNOSIS — G3184 Mild cognitive impairment, so stated: Secondary | ICD-10-CM | POA: Diagnosis not present

## 2023-04-12 DIAGNOSIS — Z79891 Long term (current) use of opiate analgesic: Secondary | ICD-10-CM | POA: Diagnosis not present

## 2023-04-12 DIAGNOSIS — G8929 Other chronic pain: Secondary | ICD-10-CM | POA: Diagnosis not present

## 2023-04-12 DIAGNOSIS — M25562 Pain in left knee: Secondary | ICD-10-CM | POA: Diagnosis not present

## 2023-04-12 DIAGNOSIS — M1711 Unilateral primary osteoarthritis, right knee: Secondary | ICD-10-CM | POA: Diagnosis not present

## 2023-04-12 DIAGNOSIS — M25561 Pain in right knee: Secondary | ICD-10-CM | POA: Diagnosis not present

## 2023-04-13 ENCOUNTER — Ambulatory Visit: Payer: Medicare Other | Admitting: Family Medicine

## 2023-04-13 NOTE — Progress Notes (Deleted)
   Rubin Payor, PhD, LAT, ATC acting as a scribe for Clementeen Graham, MD.  Maria Bolton is a 77 y.o. female who presents to [facility] at [facility] today for f/u bilat knee pain. Pt was last seen by Dr. Denyse Amass on 03/29/23 and was advised we would work to authorize [employer].  Today, pt reports ***  Pertinent review of systems: ***  Relevant historical information: ***   Exam:  There were no vitals taken for this visit. General: Well Developed, well nourished, and in no acute distress.   MSK: ***    Lab and Radiology Results No results found for this or any previous visit (from the past 72 hour(s)). No results found.     Assessment and Plan: 77 y.o. female with ***   PDMP not reviewed this encounter. No orders of the defined types were placed in this encounter.  No orders of the defined types were placed in this encounter.    Discussed warning signs or symptoms. Please see discharge instructions. Patient expresses understanding.   ***

## 2023-04-14 ENCOUNTER — Ambulatory Visit: Payer: Medicare Other | Admitting: Sports Medicine

## 2023-04-14 DIAGNOSIS — M25561 Pain in right knee: Secondary | ICD-10-CM | POA: Diagnosis not present

## 2023-04-14 DIAGNOSIS — M25562 Pain in left knee: Secondary | ICD-10-CM

## 2023-04-14 DIAGNOSIS — M170 Bilateral primary osteoarthritis of knee: Secondary | ICD-10-CM | POA: Diagnosis not present

## 2023-04-14 DIAGNOSIS — G8929 Other chronic pain: Secondary | ICD-10-CM

## 2023-04-14 MED ORDER — TRIAMCINOLONE ACETONIDE 32 MG IX SRER
32.0000 mg | Freq: Once | INTRA_ARTICULAR | Status: AC
Start: 2023-04-14 — End: 2023-04-14
  Administered 2023-04-14: 32 mg via INTRA_ARTICULAR

## 2023-04-14 MED ORDER — TRIAMCINOLONE ACETONIDE 32 MG IX SRER
32.0000 mg | Freq: Once | INTRA_ARTICULAR | Status: AC
  Administered 2023-04-14: 32 mg via INTRA_ARTICULAR

## 2023-04-14 NOTE — Progress Notes (Signed)
    Maria Bolton D.[facility] [address] [place] [address] Phone: [phone]   Assessment and Plan:     1. Primary osteoarthritis of both knees 2. Chronic pain of both knees -Chronic with exacerbation, subsequent visit - Patient presents for bilateral Zilretta procedure only visit.  Tolerated well per note below  With past medical history severe late onset Alzheimer's dementia, patient was accompanied by her husband Annette Stable to provide HPI  Procedure: Knee Joint Injection Side: Bilateral Indication: Flare of osteoarthritis  Risks explained and consent was given verbally. The site was cleaned with alcohol prep. A needle was introduced with an anterio-lateral approach. Injection given using Zilretta 32 mg. This was well tolerated and resulted in symptomatic relief.  Needle was removed, hemostasis achieved, and post injection instructions were explained.  Procedure was repeated on contralateral side pt was advised to call or return to clinic if these symptoms worsen or fail to improve as anticipated.    Pertinent previous records reviewed include none  Follow Up: As needed   Subjective:    Chief Complaint: Bilateral knee pain, worse on right  HPI:   04/14/23 Patient presents with her husband complaining of bilateral knee pain, worse on right.  Presents today for bilateral Zilretta injections  Relevant Historical Information: Hypertension, severe late onset Alzheimer dementia  Additional pertinent review of systems negative.   Current Outpatient Medications:    cholecalciferol (VITAMIN D) 1000 units tablet, Take 1,000 Units by mouth daily., Disp: , Rfl:    donepezil (ARICEPT) 10 MG tablet, Take 1 tablet (10 mg total) by mouth at bedtime., Disp: 90 tablet, Rfl: 3   levothyroxine (SYNTHROID) 25 MCG tablet, Take 25 mcg by mouth every morning., Disp: , Rfl:    losartan (COZAAR) 100 MG tablet, Take 100 mg by mouth daily., Disp: , Rfl:     memantine (NAMENDA) 10 MG tablet, Take 1 tablet (10 mg total) by mouth 2 (two) times daily., Disp: 180 tablet, Rfl: 4   metoprolol succinate (TOPROL-XL) 25 MG 24 hr tablet, Take 1 tablet (25 mg total) by mouth daily. Take with or immediately following a meal., Disp: 90 tablet, Rfl: 3   mirabegron ER (MYRBETRIQ) 50 MG TB24 tablet, Take 50 mg by mouth daily., Disp: , Rfl:    oxyCODONE (OXY IR/ROXICODONE) 5 MG immediate release tablet, Take 1 tablet (5 mg total) by mouth every 6 (six) hours as needed for severe pain., Disp: 15 tablet, Rfl: 0   rosuvastatin (CRESTOR) 10 MG tablet, Take 10 mg by mouth daily., Disp: , Rfl:        Electronically signed by:  Maria Bolton D.[facility] 2:13 PM 04/14/23

## 2023-04-14 NOTE — Progress Notes (Deleted)
error 

## 2023-04-17 DIAGNOSIS — E039 Hypothyroidism, unspecified: Secondary | ICD-10-CM | POA: Diagnosis not present

## 2023-04-17 DIAGNOSIS — Z79891 Long term (current) use of opiate analgesic: Secondary | ICD-10-CM | POA: Diagnosis not present

## 2023-04-17 DIAGNOSIS — G8929 Other chronic pain: Secondary | ICD-10-CM | POA: Diagnosis not present

## 2023-04-17 DIAGNOSIS — M25562 Pain in left knee: Secondary | ICD-10-CM | POA: Diagnosis not present

## 2023-04-17 DIAGNOSIS — R296 Repeated falls: Secondary | ICD-10-CM | POA: Diagnosis not present

## 2023-04-17 DIAGNOSIS — Z853 Personal history of malignant neoplasm of breast: Secondary | ICD-10-CM | POA: Diagnosis not present

## 2023-04-17 DIAGNOSIS — M1712 Unilateral primary osteoarthritis, left knee: Secondary | ICD-10-CM | POA: Diagnosis not present

## 2023-04-17 DIAGNOSIS — G3184 Mild cognitive impairment, so stated: Secondary | ICD-10-CM | POA: Diagnosis not present

## 2023-04-17 DIAGNOSIS — M25561 Pain in right knee: Secondary | ICD-10-CM | POA: Diagnosis not present

## 2023-04-17 DIAGNOSIS — M1711 Unilateral primary osteoarthritis, right knee: Secondary | ICD-10-CM | POA: Diagnosis not present

## 2023-04-18 DIAGNOSIS — M1711 Unilateral primary osteoarthritis, right knee: Secondary | ICD-10-CM | POA: Diagnosis not present

## 2023-04-18 DIAGNOSIS — Z79891 Long term (current) use of opiate analgesic: Secondary | ICD-10-CM | POA: Diagnosis not present

## 2023-04-18 DIAGNOSIS — M1712 Unilateral primary osteoarthritis, left knee: Secondary | ICD-10-CM | POA: Diagnosis not present

## 2023-04-18 DIAGNOSIS — E039 Hypothyroidism, unspecified: Secondary | ICD-10-CM | POA: Diagnosis not present

## 2023-04-18 DIAGNOSIS — Z853 Personal history of malignant neoplasm of breast: Secondary | ICD-10-CM | POA: Diagnosis not present

## 2023-04-18 DIAGNOSIS — G3184 Mild cognitive impairment, so stated: Secondary | ICD-10-CM | POA: Diagnosis not present

## 2023-04-18 DIAGNOSIS — M25561 Pain in right knee: Secondary | ICD-10-CM | POA: Diagnosis not present

## 2023-04-18 DIAGNOSIS — G8929 Other chronic pain: Secondary | ICD-10-CM | POA: Diagnosis not present

## 2023-04-18 DIAGNOSIS — R296 Repeated falls: Secondary | ICD-10-CM | POA: Diagnosis not present

## 2023-04-18 DIAGNOSIS — M25562 Pain in left knee: Secondary | ICD-10-CM | POA: Diagnosis not present

## 2023-04-18 NOTE — Telephone Encounter (Signed)
Pt received Zilretta injections for BILAT knee OA on 04/14/23. Can consider repeat inj on or after 07/08/23.

## 2023-04-19 DIAGNOSIS — R296 Repeated falls: Secondary | ICD-10-CM | POA: Diagnosis not present

## 2023-04-19 DIAGNOSIS — G8929 Other chronic pain: Secondary | ICD-10-CM | POA: Diagnosis not present

## 2023-04-19 DIAGNOSIS — Z853 Personal history of malignant neoplasm of breast: Secondary | ICD-10-CM | POA: Diagnosis not present

## 2023-04-19 DIAGNOSIS — Z79891 Long term (current) use of opiate analgesic: Secondary | ICD-10-CM | POA: Diagnosis not present

## 2023-04-19 DIAGNOSIS — E039 Hypothyroidism, unspecified: Secondary | ICD-10-CM | POA: Diagnosis not present

## 2023-04-19 DIAGNOSIS — M25561 Pain in right knee: Secondary | ICD-10-CM | POA: Diagnosis not present

## 2023-04-19 DIAGNOSIS — M1711 Unilateral primary osteoarthritis, right knee: Secondary | ICD-10-CM | POA: Diagnosis not present

## 2023-04-19 DIAGNOSIS — M25562 Pain in left knee: Secondary | ICD-10-CM | POA: Diagnosis not present

## 2023-04-19 DIAGNOSIS — M1712 Unilateral primary osteoarthritis, left knee: Secondary | ICD-10-CM | POA: Diagnosis not present

## 2023-04-19 DIAGNOSIS — G3184 Mild cognitive impairment, so stated: Secondary | ICD-10-CM | POA: Diagnosis not present

## 2023-04-24 DIAGNOSIS — R296 Repeated falls: Secondary | ICD-10-CM | POA: Diagnosis not present

## 2023-04-24 DIAGNOSIS — G3184 Mild cognitive impairment, so stated: Secondary | ICD-10-CM | POA: Diagnosis not present

## 2023-04-24 DIAGNOSIS — Z853 Personal history of malignant neoplasm of breast: Secondary | ICD-10-CM | POA: Diagnosis not present

## 2023-04-24 DIAGNOSIS — G8929 Other chronic pain: Secondary | ICD-10-CM | POA: Diagnosis not present

## 2023-04-24 DIAGNOSIS — E039 Hypothyroidism, unspecified: Secondary | ICD-10-CM | POA: Diagnosis not present

## 2023-04-24 DIAGNOSIS — M1711 Unilateral primary osteoarthritis, right knee: Secondary | ICD-10-CM | POA: Diagnosis not present

## 2023-04-24 DIAGNOSIS — Z79891 Long term (current) use of opiate analgesic: Secondary | ICD-10-CM | POA: Diagnosis not present

## 2023-04-24 DIAGNOSIS — M25561 Pain in right knee: Secondary | ICD-10-CM | POA: Diagnosis not present

## 2023-04-24 DIAGNOSIS — M25562 Pain in left knee: Secondary | ICD-10-CM | POA: Diagnosis not present

## 2023-04-24 DIAGNOSIS — M1712 Unilateral primary osteoarthritis, left knee: Secondary | ICD-10-CM | POA: Diagnosis not present

## 2023-04-25 DIAGNOSIS — M1712 Unilateral primary osteoarthritis, left knee: Secondary | ICD-10-CM | POA: Diagnosis not present

## 2023-04-25 DIAGNOSIS — M25562 Pain in left knee: Secondary | ICD-10-CM | POA: Diagnosis not present

## 2023-04-25 DIAGNOSIS — M1711 Unilateral primary osteoarthritis, right knee: Secondary | ICD-10-CM | POA: Diagnosis not present

## 2023-04-25 DIAGNOSIS — M25561 Pain in right knee: Secondary | ICD-10-CM | POA: Diagnosis not present

## 2023-04-25 DIAGNOSIS — Z79891 Long term (current) use of opiate analgesic: Secondary | ICD-10-CM | POA: Diagnosis not present

## 2023-04-25 DIAGNOSIS — Z853 Personal history of malignant neoplasm of breast: Secondary | ICD-10-CM | POA: Diagnosis not present

## 2023-04-25 DIAGNOSIS — G8929 Other chronic pain: Secondary | ICD-10-CM | POA: Diagnosis not present

## 2023-04-25 DIAGNOSIS — G3184 Mild cognitive impairment, so stated: Secondary | ICD-10-CM | POA: Diagnosis not present

## 2023-04-25 DIAGNOSIS — R296 Repeated falls: Secondary | ICD-10-CM | POA: Diagnosis not present

## 2023-04-25 DIAGNOSIS — E039 Hypothyroidism, unspecified: Secondary | ICD-10-CM | POA: Diagnosis not present

## 2023-04-26 DIAGNOSIS — M25562 Pain in left knee: Secondary | ICD-10-CM | POA: Diagnosis not present

## 2023-04-26 DIAGNOSIS — M1711 Unilateral primary osteoarthritis, right knee: Secondary | ICD-10-CM | POA: Diagnosis not present

## 2023-04-26 DIAGNOSIS — M1712 Unilateral primary osteoarthritis, left knee: Secondary | ICD-10-CM | POA: Diagnosis not present

## 2023-04-26 DIAGNOSIS — G8929 Other chronic pain: Secondary | ICD-10-CM | POA: Diagnosis not present

## 2023-04-26 DIAGNOSIS — Z853 Personal history of malignant neoplasm of breast: Secondary | ICD-10-CM | POA: Diagnosis not present

## 2023-04-26 DIAGNOSIS — E039 Hypothyroidism, unspecified: Secondary | ICD-10-CM | POA: Diagnosis not present

## 2023-04-26 DIAGNOSIS — G3184 Mild cognitive impairment, so stated: Secondary | ICD-10-CM | POA: Diagnosis not present

## 2023-04-26 DIAGNOSIS — R296 Repeated falls: Secondary | ICD-10-CM | POA: Diagnosis not present

## 2023-04-26 DIAGNOSIS — Z79891 Long term (current) use of opiate analgesic: Secondary | ICD-10-CM | POA: Diagnosis not present

## 2023-04-26 DIAGNOSIS — M25561 Pain in right knee: Secondary | ICD-10-CM | POA: Diagnosis not present

## 2023-04-27 DIAGNOSIS — E039 Hypothyroidism, unspecified: Secondary | ICD-10-CM | POA: Diagnosis not present

## 2023-04-27 DIAGNOSIS — R296 Repeated falls: Secondary | ICD-10-CM | POA: Diagnosis not present

## 2023-04-27 DIAGNOSIS — M25561 Pain in right knee: Secondary | ICD-10-CM | POA: Diagnosis not present

## 2023-04-27 DIAGNOSIS — M1712 Unilateral primary osteoarthritis, left knee: Secondary | ICD-10-CM | POA: Diagnosis not present

## 2023-04-27 DIAGNOSIS — M1711 Unilateral primary osteoarthritis, right knee: Secondary | ICD-10-CM | POA: Diagnosis not present

## 2023-04-27 DIAGNOSIS — G8929 Other chronic pain: Secondary | ICD-10-CM | POA: Diagnosis not present

## 2023-04-27 DIAGNOSIS — M25562 Pain in left knee: Secondary | ICD-10-CM | POA: Diagnosis not present

## 2023-04-27 DIAGNOSIS — G3184 Mild cognitive impairment, so stated: Secondary | ICD-10-CM | POA: Diagnosis not present

## 2023-04-27 DIAGNOSIS — Z853 Personal history of malignant neoplasm of breast: Secondary | ICD-10-CM | POA: Diagnosis not present

## 2023-04-27 DIAGNOSIS — Z79891 Long term (current) use of opiate analgesic: Secondary | ICD-10-CM | POA: Diagnosis not present

## 2023-05-02 DIAGNOSIS — R296 Repeated falls: Secondary | ICD-10-CM | POA: Diagnosis not present

## 2023-05-02 DIAGNOSIS — M25562 Pain in left knee: Secondary | ICD-10-CM | POA: Diagnosis not present

## 2023-05-02 DIAGNOSIS — Z853 Personal history of malignant neoplasm of breast: Secondary | ICD-10-CM | POA: Diagnosis not present

## 2023-05-02 DIAGNOSIS — G8929 Other chronic pain: Secondary | ICD-10-CM | POA: Diagnosis not present

## 2023-05-02 DIAGNOSIS — E039 Hypothyroidism, unspecified: Secondary | ICD-10-CM | POA: Diagnosis not present

## 2023-05-02 DIAGNOSIS — G3184 Mild cognitive impairment, so stated: Secondary | ICD-10-CM | POA: Diagnosis not present

## 2023-05-02 DIAGNOSIS — Z79891 Long term (current) use of opiate analgesic: Secondary | ICD-10-CM | POA: Diagnosis not present

## 2023-05-02 DIAGNOSIS — M1712 Unilateral primary osteoarthritis, left knee: Secondary | ICD-10-CM | POA: Diagnosis not present

## 2023-05-02 DIAGNOSIS — M25561 Pain in right knee: Secondary | ICD-10-CM | POA: Diagnosis not present

## 2023-05-02 DIAGNOSIS — M1711 Unilateral primary osteoarthritis, right knee: Secondary | ICD-10-CM | POA: Diagnosis not present

## 2023-05-03 DIAGNOSIS — R296 Repeated falls: Secondary | ICD-10-CM | POA: Diagnosis not present

## 2023-05-03 DIAGNOSIS — M1712 Unilateral primary osteoarthritis, left knee: Secondary | ICD-10-CM | POA: Diagnosis not present

## 2023-05-03 DIAGNOSIS — G3184 Mild cognitive impairment, so stated: Secondary | ICD-10-CM | POA: Diagnosis not present

## 2023-05-03 DIAGNOSIS — G8929 Other chronic pain: Secondary | ICD-10-CM | POA: Diagnosis not present

## 2023-05-03 DIAGNOSIS — M25561 Pain in right knee: Secondary | ICD-10-CM | POA: Diagnosis not present

## 2023-05-03 DIAGNOSIS — M25562 Pain in left knee: Secondary | ICD-10-CM | POA: Diagnosis not present

## 2023-05-03 DIAGNOSIS — Z853 Personal history of malignant neoplasm of breast: Secondary | ICD-10-CM | POA: Diagnosis not present

## 2023-05-03 DIAGNOSIS — M1711 Unilateral primary osteoarthritis, right knee: Secondary | ICD-10-CM | POA: Diagnosis not present

## 2023-05-03 DIAGNOSIS — Z79891 Long term (current) use of opiate analgesic: Secondary | ICD-10-CM | POA: Diagnosis not present

## 2023-05-03 DIAGNOSIS — E039 Hypothyroidism, unspecified: Secondary | ICD-10-CM | POA: Diagnosis not present

## 2023-05-08 DIAGNOSIS — M25561 Pain in right knee: Secondary | ICD-10-CM | POA: Diagnosis not present

## 2023-05-08 DIAGNOSIS — G8929 Other chronic pain: Secondary | ICD-10-CM | POA: Diagnosis not present

## 2023-05-08 DIAGNOSIS — Z79891 Long term (current) use of opiate analgesic: Secondary | ICD-10-CM | POA: Diagnosis not present

## 2023-05-08 DIAGNOSIS — M1712 Unilateral primary osteoarthritis, left knee: Secondary | ICD-10-CM | POA: Diagnosis not present

## 2023-05-08 DIAGNOSIS — R296 Repeated falls: Secondary | ICD-10-CM | POA: Diagnosis not present

## 2023-05-08 DIAGNOSIS — M1711 Unilateral primary osteoarthritis, right knee: Secondary | ICD-10-CM | POA: Diagnosis not present

## 2023-05-08 DIAGNOSIS — M25562 Pain in left knee: Secondary | ICD-10-CM | POA: Diagnosis not present

## 2023-05-08 DIAGNOSIS — E039 Hypothyroidism, unspecified: Secondary | ICD-10-CM | POA: Diagnosis not present

## 2023-05-08 DIAGNOSIS — Z853 Personal history of malignant neoplasm of breast: Secondary | ICD-10-CM | POA: Diagnosis not present

## 2023-05-08 DIAGNOSIS — G3184 Mild cognitive impairment, so stated: Secondary | ICD-10-CM | POA: Diagnosis not present

## 2023-05-09 DIAGNOSIS — G3184 Mild cognitive impairment, so stated: Secondary | ICD-10-CM | POA: Diagnosis not present

## 2023-05-09 DIAGNOSIS — E039 Hypothyroidism, unspecified: Secondary | ICD-10-CM | POA: Diagnosis not present

## 2023-05-09 DIAGNOSIS — R296 Repeated falls: Secondary | ICD-10-CM | POA: Diagnosis not present

## 2023-05-09 DIAGNOSIS — M1712 Unilateral primary osteoarthritis, left knee: Secondary | ICD-10-CM | POA: Diagnosis not present

## 2023-05-09 DIAGNOSIS — G8929 Other chronic pain: Secondary | ICD-10-CM | POA: Diagnosis not present

## 2023-05-09 DIAGNOSIS — M25561 Pain in right knee: Secondary | ICD-10-CM | POA: Diagnosis not present

## 2023-05-09 DIAGNOSIS — Z853 Personal history of malignant neoplasm of breast: Secondary | ICD-10-CM | POA: Diagnosis not present

## 2023-05-09 DIAGNOSIS — M25562 Pain in left knee: Secondary | ICD-10-CM | POA: Diagnosis not present

## 2023-05-09 DIAGNOSIS — M1711 Unilateral primary osteoarthritis, right knee: Secondary | ICD-10-CM | POA: Diagnosis not present

## 2023-05-09 DIAGNOSIS — Z79891 Long term (current) use of opiate analgesic: Secondary | ICD-10-CM | POA: Diagnosis not present

## 2023-05-10 DIAGNOSIS — R7309 Other abnormal glucose: Secondary | ICD-10-CM | POA: Diagnosis not present

## 2023-05-10 DIAGNOSIS — I129 Hypertensive chronic kidney disease with stage 1 through stage 4 chronic kidney disease, or unspecified chronic kidney disease: Secondary | ICD-10-CM | POA: Diagnosis not present

## 2023-05-15 DIAGNOSIS — J449 Chronic obstructive pulmonary disease, unspecified: Secondary | ICD-10-CM | POA: Diagnosis not present

## 2023-05-15 DIAGNOSIS — Z0000 Encounter for general adult medical examination without abnormal findings: Secondary | ICD-10-CM | POA: Diagnosis not present

## 2023-05-15 DIAGNOSIS — L4050 Arthropathic psoriasis, unspecified: Secondary | ICD-10-CM | POA: Diagnosis not present

## 2023-05-15 DIAGNOSIS — I5032 Chronic diastolic (congestive) heart failure: Secondary | ICD-10-CM | POA: Diagnosis not present

## 2023-05-15 DIAGNOSIS — I10 Essential (primary) hypertension: Secondary | ICD-10-CM | POA: Diagnosis not present

## 2023-05-15 DIAGNOSIS — I129 Hypertensive chronic kidney disease with stage 1 through stage 4 chronic kidney disease, or unspecified chronic kidney disease: Secondary | ICD-10-CM | POA: Diagnosis not present

## 2023-05-15 DIAGNOSIS — I700 Atherosclerosis of aorta: Secondary | ICD-10-CM | POA: Diagnosis not present

## 2023-05-15 DIAGNOSIS — Z23 Encounter for immunization: Secondary | ICD-10-CM | POA: Diagnosis not present

## 2023-05-15 DIAGNOSIS — R3129 Other microscopic hematuria: Secondary | ICD-10-CM | POA: Diagnosis not present

## 2023-05-15 DIAGNOSIS — R29898 Other symptoms and signs involving the musculoskeletal system: Secondary | ICD-10-CM | POA: Diagnosis not present

## 2023-05-16 DIAGNOSIS — E039 Hypothyroidism, unspecified: Secondary | ICD-10-CM | POA: Diagnosis not present

## 2023-05-16 DIAGNOSIS — G8929 Other chronic pain: Secondary | ICD-10-CM | POA: Diagnosis not present

## 2023-05-16 DIAGNOSIS — Z853 Personal history of malignant neoplasm of breast: Secondary | ICD-10-CM | POA: Diagnosis not present

## 2023-05-16 DIAGNOSIS — M25561 Pain in right knee: Secondary | ICD-10-CM | POA: Diagnosis not present

## 2023-05-16 DIAGNOSIS — M1711 Unilateral primary osteoarthritis, right knee: Secondary | ICD-10-CM | POA: Diagnosis not present

## 2023-05-16 DIAGNOSIS — M25562 Pain in left knee: Secondary | ICD-10-CM | POA: Diagnosis not present

## 2023-05-16 DIAGNOSIS — R296 Repeated falls: Secondary | ICD-10-CM | POA: Diagnosis not present

## 2023-05-16 DIAGNOSIS — M1712 Unilateral primary osteoarthritis, left knee: Secondary | ICD-10-CM | POA: Diagnosis not present

## 2023-05-16 DIAGNOSIS — G3184 Mild cognitive impairment, so stated: Secondary | ICD-10-CM | POA: Diagnosis not present

## 2023-05-16 DIAGNOSIS — Z79891 Long term (current) use of opiate analgesic: Secondary | ICD-10-CM | POA: Diagnosis not present

## 2023-05-18 DIAGNOSIS — E039 Hypothyroidism, unspecified: Secondary | ICD-10-CM | POA: Diagnosis not present

## 2023-05-18 DIAGNOSIS — Z79891 Long term (current) use of opiate analgesic: Secondary | ICD-10-CM | POA: Diagnosis not present

## 2023-05-18 DIAGNOSIS — M1712 Unilateral primary osteoarthritis, left knee: Secondary | ICD-10-CM | POA: Diagnosis not present

## 2023-05-18 DIAGNOSIS — G3184 Mild cognitive impairment, so stated: Secondary | ICD-10-CM | POA: Diagnosis not present

## 2023-05-18 DIAGNOSIS — M1711 Unilateral primary osteoarthritis, right knee: Secondary | ICD-10-CM | POA: Diagnosis not present

## 2023-05-18 DIAGNOSIS — G8929 Other chronic pain: Secondary | ICD-10-CM | POA: Diagnosis not present

## 2023-05-18 DIAGNOSIS — M25562 Pain in left knee: Secondary | ICD-10-CM | POA: Diagnosis not present

## 2023-05-18 DIAGNOSIS — R296 Repeated falls: Secondary | ICD-10-CM | POA: Diagnosis not present

## 2023-05-18 DIAGNOSIS — Z853 Personal history of malignant neoplasm of breast: Secondary | ICD-10-CM | POA: Diagnosis not present

## 2023-05-18 DIAGNOSIS — M25561 Pain in right knee: Secondary | ICD-10-CM | POA: Diagnosis not present

## 2023-05-23 DIAGNOSIS — G3184 Mild cognitive impairment, so stated: Secondary | ICD-10-CM | POA: Diagnosis not present

## 2023-05-23 DIAGNOSIS — M1712 Unilateral primary osteoarthritis, left knee: Secondary | ICD-10-CM | POA: Diagnosis not present

## 2023-05-23 DIAGNOSIS — M25562 Pain in left knee: Secondary | ICD-10-CM | POA: Diagnosis not present

## 2023-05-23 DIAGNOSIS — E039 Hypothyroidism, unspecified: Secondary | ICD-10-CM | POA: Diagnosis not present

## 2023-05-23 DIAGNOSIS — Z853 Personal history of malignant neoplasm of breast: Secondary | ICD-10-CM | POA: Diagnosis not present

## 2023-05-23 DIAGNOSIS — R296 Repeated falls: Secondary | ICD-10-CM | POA: Diagnosis not present

## 2023-05-23 DIAGNOSIS — G8929 Other chronic pain: Secondary | ICD-10-CM | POA: Diagnosis not present

## 2023-05-23 DIAGNOSIS — M25561 Pain in right knee: Secondary | ICD-10-CM | POA: Diagnosis not present

## 2023-05-23 DIAGNOSIS — M1711 Unilateral primary osteoarthritis, right knee: Secondary | ICD-10-CM | POA: Diagnosis not present

## 2023-05-23 DIAGNOSIS — Z79891 Long term (current) use of opiate analgesic: Secondary | ICD-10-CM | POA: Diagnosis not present

## 2023-05-24 DIAGNOSIS — G3184 Mild cognitive impairment, so stated: Secondary | ICD-10-CM | POA: Diagnosis not present

## 2023-05-24 DIAGNOSIS — M25562 Pain in left knee: Secondary | ICD-10-CM | POA: Diagnosis not present

## 2023-05-24 DIAGNOSIS — M25561 Pain in right knee: Secondary | ICD-10-CM | POA: Diagnosis not present

## 2023-05-24 DIAGNOSIS — M1711 Unilateral primary osteoarthritis, right knee: Secondary | ICD-10-CM | POA: Diagnosis not present

## 2023-05-24 DIAGNOSIS — Z79891 Long term (current) use of opiate analgesic: Secondary | ICD-10-CM | POA: Diagnosis not present

## 2023-05-24 DIAGNOSIS — R296 Repeated falls: Secondary | ICD-10-CM | POA: Diagnosis not present

## 2023-05-24 DIAGNOSIS — E039 Hypothyroidism, unspecified: Secondary | ICD-10-CM | POA: Diagnosis not present

## 2023-05-24 DIAGNOSIS — M1712 Unilateral primary osteoarthritis, left knee: Secondary | ICD-10-CM | POA: Diagnosis not present

## 2023-05-24 DIAGNOSIS — G8929 Other chronic pain: Secondary | ICD-10-CM | POA: Diagnosis not present

## 2023-05-24 DIAGNOSIS — Z853 Personal history of malignant neoplasm of breast: Secondary | ICD-10-CM | POA: Diagnosis not present

## 2023-05-29 DIAGNOSIS — G3184 Mild cognitive impairment, so stated: Secondary | ICD-10-CM | POA: Diagnosis not present

## 2023-05-29 DIAGNOSIS — E039 Hypothyroidism, unspecified: Secondary | ICD-10-CM | POA: Diagnosis not present

## 2023-05-29 DIAGNOSIS — M25561 Pain in right knee: Secondary | ICD-10-CM | POA: Diagnosis not present

## 2023-05-29 DIAGNOSIS — R296 Repeated falls: Secondary | ICD-10-CM | POA: Diagnosis not present

## 2023-05-29 DIAGNOSIS — Z79891 Long term (current) use of opiate analgesic: Secondary | ICD-10-CM | POA: Diagnosis not present

## 2023-05-29 DIAGNOSIS — Z853 Personal history of malignant neoplasm of breast: Secondary | ICD-10-CM | POA: Diagnosis not present

## 2023-05-29 DIAGNOSIS — M1711 Unilateral primary osteoarthritis, right knee: Secondary | ICD-10-CM | POA: Diagnosis not present

## 2023-05-29 DIAGNOSIS — G8929 Other chronic pain: Secondary | ICD-10-CM | POA: Diagnosis not present

## 2023-05-29 DIAGNOSIS — M25562 Pain in left knee: Secondary | ICD-10-CM | POA: Diagnosis not present

## 2023-05-29 DIAGNOSIS — M1712 Unilateral primary osteoarthritis, left knee: Secondary | ICD-10-CM | POA: Diagnosis not present

## 2023-05-31 ENCOUNTER — Emergency Department (HOSPITAL_COMMUNITY)
Admission: EM | Admit: 2023-05-31 | Discharge: 2023-05-31 | Disposition: A | Discharge status: Home / Self Care | Payer: Medicare Other | Attending: Emergency Medicine | Admitting: Emergency Medicine

## 2023-05-31 ENCOUNTER — Emergency Department (HOSPITAL_COMMUNITY): Payer: Medicare Other

## 2023-05-31 ENCOUNTER — Telehealth: Payer: Medicare Other | Admitting: Family Medicine

## 2023-05-31 ENCOUNTER — Other Ambulatory Visit: Payer: Self-pay

## 2023-05-31 DIAGNOSIS — I509 Heart failure, unspecified: Secondary | ICD-10-CM | POA: Diagnosis not present

## 2023-05-31 DIAGNOSIS — Z79899 Other long term (current) drug therapy: Secondary | ICD-10-CM | POA: Diagnosis not present

## 2023-05-31 DIAGNOSIS — Z853 Personal history of malignant neoplasm of breast: Secondary | ICD-10-CM | POA: Insufficient documentation

## 2023-05-31 DIAGNOSIS — W19XXXA Unspecified fall, initial encounter: Secondary | ICD-10-CM

## 2023-05-31 DIAGNOSIS — S52614A Nondisplaced fracture of right ulna styloid process, initial encounter for closed fracture: Secondary | ICD-10-CM | POA: Diagnosis not present

## 2023-05-31 DIAGNOSIS — S59911A Unspecified injury of right forearm, initial encounter: Secondary | ICD-10-CM | POA: Diagnosis present

## 2023-05-31 DIAGNOSIS — S52514A Nondisplaced fracture of right radial styloid process, initial encounter for closed fracture: Secondary | ICD-10-CM

## 2023-05-31 DIAGNOSIS — M7989 Other specified soft tissue disorders: Secondary | ICD-10-CM

## 2023-05-31 DIAGNOSIS — S60221A Contusion of right hand, initial encounter: Secondary | ICD-10-CM | POA: Diagnosis not present

## 2023-05-31 DIAGNOSIS — F0390 Unspecified dementia without behavioral disturbance: Secondary | ICD-10-CM | POA: Insufficient documentation

## 2023-05-31 DIAGNOSIS — R936 Abnormal findings on diagnostic imaging of limbs: Secondary | ICD-10-CM | POA: Diagnosis not present

## 2023-05-31 DIAGNOSIS — M19041 Primary osteoarthritis, right hand: Secondary | ICD-10-CM | POA: Diagnosis not present

## 2023-05-31 MED ORDER — IBUPROFEN 800 MG PO TABS
800.0000 mg | ORAL_TABLET | Freq: Once | ORAL | Status: AC
  Administered 2023-05-31: 800 mg via ORAL
  Filled 2023-05-31: qty 1

## 2023-05-31 NOTE — Discharge Instructions (Addendum)
As discussed, imaging shows a possible fracture to the radial styloid.  Alternate between Ibuprofen and Tylenol for pain every 4 hours as needed.  Follow-up with Dr. Denese Killings in the next several days for reevaluation.  Get help right away if: You cannot move your fingers. You have severe pain, especially if the pain changes significantly or suddenly. Your fingers or your hand: Become numb, cold, or pale. Turn a bluish color.

## 2023-05-31 NOTE — Progress Notes (Signed)
Virtual Visit Consent   Maria Bolton, you are scheduled for a virtual visit with a [facility] provider today. Just as with appointments in the office, your consent must be obtained to participate. Your consent will be active for this visit and any virtual visit you may have with one of our providers in the next 365 days. If you have a MyChart account, a copy of this consent can be sent to you electronically.  As this is a virtual visit, video technology does not allow for your provider to perform a traditional examination. This may limit your provider's ability to fully assess your condition. If your provider identifies any concerns that need to be evaluated in person or the need to arrange testing (such as labs, EKG, etc.), we will make arrangements to do so. Although advances in technology are sophisticated, we cannot ensure that it will always work on either your end or our end. If the connection with a video visit is poor, the visit may have to be switched to a telephone visit. With either a video or telephone visit, we are not always able to ensure that we have a secure connection.  By engaging in this virtual visit, you consent to the provision of healthcare and authorize for your insurance to be billed (if applicable) for the services provided during this visit. Depending on your insurance coverage, you may receive a charge related to this service.  I need to obtain your verbal consent now. Are you willing to proceed with your visit today? Maria Bolton has provided verbal consent on 05/31/2023 for a virtual visit (video or telephone). Reed Pandy, [place]  Date: 05/31/2023 1:57 PM  Virtual Visit via Video Note   IReed Pandy, connected with  Maria Bolton  ([id number], [id number]) on 05/31/23 at  1:45 PM EST by a video-enabled telemedicine application and verified that I am speaking with the correct person using two identifiers.  Location: Patient: Virtual Visit Location Patient:  Home Provider: Virtual Visit Location Provider: Home Office   I discussed the limitations of evaluation and management by telemedicine and the availability of in person appointments. The patient expressed understanding and agreed to proceed.    History of Present Illness: Maria Bolton is a 77 y.o. who identifies as a female who was assigned female at birth, and is being seen today for c/o hand swelling and discoloration.  Pt's son Feliz Beam  states she injured her hand a few weeks ago but her fingers are starting to turn purple. Son states Pt has dementia. Son states patient fell and injured her hand.  Pt was not taken to the hospital or to her PCP. Son states patient already has loss function of right hand and leg.  Son states her pointer finger is swollen and purple. Son states patient has pain in hand and just wants to know if they should take the patient to the hospital.  HPI: HPI  Problems:  Patient Active Problem List   Diagnosis Date Noted   Chronic pain of both knees 03/29/2023   Primary osteoarthritis of both knees 03/29/2023   Right sided weakness 02/20/2023   Gait abnormality 02/20/2023   Genetic testing 12/03/2021   Family history of breast cancer 11/18/2021   Malignant neoplasm of lower-inner quadrant of left breast in female, estrogen receptor positive (HCC) 10/18/2021   Severe late onset Alzheimer's dementia (HCC) 02/02/2021   Abnormal thyroid blood test 12/27/2016   Abnormal glucose 09/06/2016   Mild cognitive impairment 09/06/2016   Fever 09/19/2015  Acute bronchitis 09/19/2015   Anxiety about health 09/19/2015   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   Hypertension 05/11/2011    Allergies: No Known Allergies Medications:  Current Outpatient Medications:    cholecalciferol (VITAMIN D) 1000 units tablet, Take 1,000 Units by mouth daily., Disp: , Rfl:    donepezil (ARICEPT) 10 MG tablet, Take 1 tablet (10 mg total) by mouth at bedtime., Disp: 90 tablet,  Rfl: 3   levothyroxine (SYNTHROID) 25 MCG tablet, Take 25 mcg by mouth every morning., Disp: , Rfl:    losartan (COZAAR) 100 MG tablet, Take 100 mg by mouth daily., Disp: , Rfl:    memantine (NAMENDA) 10 MG tablet, Take 1 tablet (10 mg total) by mouth 2 (two) times daily., Disp: 180 tablet, Rfl: 4   metoprolol succinate (TOPROL-XL) 25 MG 24 hr tablet, Take 1 tablet (25 mg total) by mouth daily. Take with or immediately following a meal., Disp: 90 tablet, Rfl: 3   mirabegron ER (MYRBETRIQ) 50 MG TB24 tablet, Take 50 mg by mouth daily., Disp: , Rfl:    oxyCODONE (OXY IR/ROXICODONE) 5 MG immediate release tablet, Take 1 tablet (5 mg total) by mouth every 6 (six) hours as needed for severe pain., Disp: 15 tablet, Rfl: 0   rosuvastatin (CRESTOR) 10 MG tablet, Take 10 mg by mouth daily., Disp: , Rfl:   Observations/Objective: Patient is well-developed, well-nourished in no acute distress.  Resting comfortably at home.  Head is normocephalic, atraumatic.  No labored breathing.  Speech is clear and coherent with logical content.  Patient is alert and oriented at baseline.    Assessment and Plan: 1. Hand swelling (Primary)  -Advised son to take patient to the hospital for further evaluation -Son stated that he would.   Follow Up Instructions: I discussed the assessment and treatment plan with the patient. The patient was provided an opportunity to ask questions and all were answered. The patient agreed with the plan and demonstrated an understanding of the instructions.  A copy of instructions were sent to the patient via MyChart unless otherwise noted below.    The patient was advised to call back or seek an in-person evaluation if the symptoms worsen or if the condition fails to improve as anticipated.    Reed Pandy, PA-C

## 2023-05-31 NOTE — Patient Instructions (Signed)
Maria Bolton, thank you for joining Reed Pandy, PA-C for today's virtual visit.  While this provider is not your primary care provider (PCP), if your PCP is located in our provider database this encounter information will be shared with them immediately following your visit.   A [facility] MyChart account gives you access to today's visit and all your visits, tests, and labs performed at [facility] " click here if you don't have a [facility] MyChart account or go to [email or web address]  Consent: (Patient) Maria Bolton provided verbal consent for this virtual visit at the beginning of the encounter.  Current Medications:  Current Outpatient Medications:    cholecalciferol (VITAMIN D) 1000 units tablet, Take 1,000 Units by mouth daily., Disp: , Rfl:    donepezil (ARICEPT) 10 MG tablet, Take 1 tablet (10 mg total) by mouth at bedtime., Disp: 90 tablet, Rfl: 3   levothyroxine (SYNTHROID) 25 MCG tablet, Take 25 mcg by mouth every morning., Disp: , Rfl:    losartan (COZAAR) 100 MG tablet, Take 100 mg by mouth daily., Disp: , Rfl:    memantine (NAMENDA) 10 MG tablet, Take 1 tablet (10 mg total) by mouth 2 (two) times daily., Disp: 180 tablet, Rfl: 4   metoprolol succinate (TOPROL-XL) 25 MG 24 hr tablet, Take 1 tablet (25 mg total) by mouth daily. Take with or immediately following a meal., Disp: 90 tablet, Rfl: 3   mirabegron ER (MYRBETRIQ) 50 MG TB24 tablet, Take 50 mg by mouth daily., Disp: , Rfl:    oxyCODONE (OXY IR/ROXICODONE) 5 MG immediate release tablet, Take 1 tablet (5 mg total) by mouth every 6 (six) hours as needed for severe pain., Disp: 15 tablet, Rfl: 0   rosuvastatin (CRESTOR) 10 MG tablet, Take 10 mg by mouth daily., Disp: , Rfl:    Medications ordered in this encounter:  No orders of the defined types were placed in this encounter.    *If you need refills on other medications prior to your next appointment, please contact your pharmacy*  Follow-Up: Call  back or seek an in-person evaluation if the symptoms worsen or if the condition fails to improve as anticipated.  [facility] [phone]  Other Instructions Hand Pain Hand pain can make it hard to do daily activities. Many things can cause hand pain. Some common causes are: Injuries. These may include: Broken bones (fractures)and cuts. Overuse injuries from doing the same movements many times (repetitive activity). Arthritis. Lumps in the tendons or joints of the hand and wrist (ganglion cysts). Nerve compression syndromes (carpal tunnel syndrome). Inflammation of the tendons (tendinitis). Infection. Follow these instructions at home: Managing pain, stiffness, and swelling     Take over-the-counter and prescription medicines only as told by your health care provider. If told, put ice on the affected area. Put ice in a plastic bag. Place a towel between your skin and the bag. Leave the ice on for 20 minutes, 2-3 times a day. If told, apply heat to the affected area before you exercise or as often as told by your provider. Use the heat source that your provider recommends, such as a moist heat pack or a heating pad. Place a towel between your skin and the heat source. Leave the heat on for 20-30 minutes. If your skin turns bright red, remove the ice or heat right away to prevent skin damage. The risk of damage is higher if you cannot feel pain, heat, or cold. Activity Take breaks from repetitive activity often.  Minimize stress on your hands and wrists as much as possible. Do stretches or exercises as told by your provider. Do not do activities that make your pain worse. Wear a hand splint or support as told by your provider. Contact a health care provider if: Your pain does not get better after a few days. Your pain gets worse. Your pain affects your ability to do your daily activities. Your hand becomes warm, red, or swollen. Your hand is numb or tingling. Get  help right away if: Your hand is extremely swollen or is an unusual shape. Your hand or fingers turn white or blue. You cannot move your hand, wrist, or fingers. This information is not intended to replace advice given to you by your health care provider. Make sure you discuss any questions you have with your health care provider. Document Revised: 12/29/2021 Document Reviewed: 12/29/2021 Elsevier Patient Education  2024 Elsevier Inc.    If you have been instructed to have an in-person evaluation today at a local Urgent Care facility, please use the link below. It will take you to a list of all of our available [facility], including address, phone number and hours of operation. Please do not delay care.  [facility]  If you or a family member do not have a primary care provider, use the link below to schedule a visit and establish care. When you choose a [facility] primary care physician or advanced practice provider, you gain a long-term partner in health. Find a Primary Care Provider  Learn more about [facility] in-office and virtual care options: [facility] - Get Care Now

## 2023-05-31 NOTE — Progress Notes (Signed)
Orthopedic Tech Progress Note Patient Details:  Maria Bolton 22-Nov-1945 [id number]  Ortho Devices Type of Ortho Device: Volar splint Ortho Device/Splint Location: right volar applied Ortho Device/Splint Interventions: Ordered, Application, Adjustment   Post Interventions Patient Tolerated: Well Instructions Provided: Adjustment of device, Care of device  Kizzie Fantasia 05/31/2023, 5:10 PM

## 2023-05-31 NOTE — ED Provider Notes (Addendum)
[place] EMERGENCY DEPARTMENT AT [facility] Provider Note   CSN: [id number] Arrival date & time: 05/31/23  1424     History  Chief Complaint  Patient presents with   Hand Injury   hand swelling    Katlyne Bridenbaugh is a 77 y.o. female with a history of ductal carcinoma in situ of the left breast, severe late onset Alzheimer's dementia, and CHF who presents to the ED today for hand pain.  Patient's son reports that she had an unwitnessed fall at home about 3 weeks ago at home and injured her right hand.  Her fingers started bruising ad swelling several days ago. She is unable to tell me if she is having pain in the hand, wrist, forearm, or elbow due to her dementia.  He states that she has right-sided weakness for the past 3-4 months and is working with her doctors to find out the cause. She had a MRI in September that was negative for stroke. She does not have any worsened weakness from baseline. No additional complaints or concerns at this time.    Home Medications Prior to Admission medications   Medication Sig Start Date End Date Taking? Authorizing Provider  cholecalciferol (VITAMIN D) 1000 units tablet Take 1,000 Units by mouth daily.    [provider]  donepezil (ARICEPT) 10 MG tablet Take 1 tablet (10 mg total) by mouth at bedtime. 02/20/23   Levert Feinstein, MD  levothyroxine (SYNTHROID) 25 MCG tablet Take 25 mcg by mouth every morning. 06/16/20   [provider]  losartan (COZAAR) 100 MG tablet Take 100 mg by mouth daily.    [provider]  memantine (NAMENDA) 10 MG tablet Take 1 tablet (10 mg total) by mouth 2 (two) times daily. 02/20/23   Levert Feinstein, MD  metoprolol succinate (TOPROL-XL) 25 MG 24 hr tablet Take 1 tablet (25 mg total) by mouth daily. Take with or immediately following a meal. 08/27/20 07/11/29  Yates Decamp, MD  mirabegron ER (MYRBETRIQ) 50 MG TB24 tablet Take 50 mg by mouth daily.    [provider]  oxyCODONE (OXY  IR/ROXICODONE) 5 MG immediate release tablet Take 1 tablet (5 mg total) by mouth every 6 (six) hours as needed for severe pain. 10/26/21   Almond Lint, MD  rosuvastatin (CRESTOR) 10 MG tablet Take 10 mg by mouth daily. 05/06/19   [provider]      Allergies    Patient has no known allergies.    Review of Systems   Review of Systems  Skin:  Positive for color change.  All other systems reviewed and are negative.   Physical Exam Updated Vital Signs BP (!) 148/84 (BP Location: Right Arm)   Pulse 68   Temp 98.3 F (36.8 C) (Oral)   Resp 16   Ht 5\' 6"  (1.676 m)   Wt 74.8 kg   SpO2 100%   BMI 26.63 kg/m  Physical Exam Vitals and nursing note reviewed.  Constitutional:      General: She is not in acute distress.    Appearance: Normal appearance.  HENT:     Head: Normocephalic and atraumatic.     Mouth/Throat:     Mouth: Mucous membranes are moist.  Eyes:     Conjunctiva/sclera: Conjunctivae normal.     Pupils: Pupils are equal, round, and reactive to light.  Cardiovascular:     Rate and Rhythm: Normal rate and regular rhythm.     Pulses: Normal pulses.  Heart sounds: Normal heart sounds.  Pulmonary:     Effort: Pulmonary effort is normal.     Breath sounds: Normal breath sounds.  Abdominal:     Palpations: Abdomen is soft.     Tenderness: There is no abdominal tenderness.  Musculoskeletal:        General: Swelling present. Normal range of motion.     Comments: No grimacing with passive ROM of right upper extremity. Abrasions present at lateral right forearm with a yellow bruise with swelling present at the dorsum of the palm as well as fingers 2-4. No grimacing appreciated with palpation to the upper or lower extremities bilaterally.  Capillary refill is <2 seconds. Palpable radial pulses bilaterally.  Skin:    General: Skin is warm and dry.     Findings: No rash.  Neurological:     General: No focal deficit present.     Mental Status: She is alert.  Mental status is at baseline.     Comments: Grip strength left hand > right hand  Psychiatric:        Mood and Affect: Mood normal.        Behavior: Behavior normal.    ED Results / Procedures / Treatments   Labs (all labs ordered are listed, but only abnormal results are displayed) Labs Reviewed - No data to display  EKG None  Radiology DG Wrist Complete Right Result Date: 05/31/2023 CLINICAL DATA:  Right hand swelling and discoloration status post fall. Severe dementia. Fall. EXAM: RIGHT FOREARM - 2 VIEW; RIGHT ELBOW - COMPLETE 3+ VIEW; RIGHT WRIST - COMPLETE 3+ VIEW COMPARISON:  None available FINDINGS: Right wrist: Well corticated ossific fragment adjacent to the radial styloid process likely sequelae of remote trauma. There is a subtle lucency in the medial aspect of the distal radius which is favored to be a vascular channel, however a nondisplaced fracture is difficult to exclude. Mild diffuse soft tissue swelling. Right forearm: No fracture, dislocation, or soft tissue abnormality. Right elbow: Cortical irregularity of the medial and lateral humeral epicondyles consistent with prior episodes of epicondylitis. No acute fracture or dislocation. Mild soft tissue swelling overlying the olecranon. IMPRESSION: 1. Subtle lucency in the medial aspect of the distal radius may be a vascular channel, however a nondisplaced fracture is difficult to completely exclude. 2. Otherwise, no acute fracture or dislocation of the right wrist, forearm, or elbow. 3. Mild soft tissue swelling overlying the olecranon. Electronically Signed   By: Acquanetta Belling M.D.   On: 05/31/2023 16:14   DG Forearm Right Result Date: 05/31/2023 CLINICAL DATA:  Right hand swelling and discoloration status post fall. Severe dementia. Fall. EXAM: RIGHT FOREARM - 2 VIEW; RIGHT ELBOW - COMPLETE 3+ VIEW; RIGHT WRIST - COMPLETE 3+ VIEW COMPARISON:  None available FINDINGS: Right wrist: Well corticated ossific fragment adjacent to the  radial styloid process likely sequelae of remote trauma. There is a subtle lucency in the medial aspect of the distal radius which is favored to be a vascular channel, however a nondisplaced fracture is difficult to exclude. Mild diffuse soft tissue swelling. Right forearm: No fracture, dislocation, or soft tissue abnormality. Right elbow: Cortical irregularity of the medial and lateral humeral epicondyles consistent with prior episodes of epicondylitis. No acute fracture or dislocation. Mild soft tissue swelling overlying the olecranon. IMPRESSION: 1. Subtle lucency in the medial aspect of the distal radius may be a vascular channel, however a nondisplaced fracture is difficult to completely exclude. 2. Otherwise, no acute fracture or dislocation of  the right wrist, forearm, or elbow. 3. Mild soft tissue swelling overlying the olecranon. Electronically Signed   By: Acquanetta Belling M.D.   On: 05/31/2023 16:14   DG Elbow Complete Right Result Date: 05/31/2023 CLINICAL DATA:  Right hand swelling and discoloration status post fall. Severe dementia. Fall. EXAM: RIGHT FOREARM - 2 VIEW; RIGHT ELBOW - COMPLETE 3+ VIEW; RIGHT WRIST - COMPLETE 3+ VIEW COMPARISON:  None available FINDINGS: Right wrist: Well corticated ossific fragment adjacent to the radial styloid process likely sequelae of remote trauma. There is a subtle lucency in the medial aspect of the distal radius which is favored to be a vascular channel, however a nondisplaced fracture is difficult to exclude. Mild diffuse soft tissue swelling. Right forearm: No fracture, dislocation, or soft tissue abnormality. Right elbow: Cortical irregularity of the medial and lateral humeral epicondyles consistent with prior episodes of epicondylitis. No acute fracture or dislocation. Mild soft tissue swelling overlying the olecranon. IMPRESSION: 1. Subtle lucency in the medial aspect of the distal radius may be a vascular channel, however a nondisplaced fracture is difficult  to completely exclude. 2. Otherwise, no acute fracture or dislocation of the right wrist, forearm, or elbow. 3. Mild soft tissue swelling overlying the olecranon. Electronically Signed   By: Acquanetta Belling M.D.   On: 05/31/2023 16:14   DG Hand Complete Right Result Date: 05/31/2023 CLINICAL DATA:  Right hand swelling and discoloration. EXAM: RIGHT HAND - COMPLETE 3+ VIEW COMPARISON:  None Available. FINDINGS: No acute fracture or dislocation. No aggressive osseous lesion. Old avulsion fracture of tip of the radial styloid process noted. Mild-to-moderate degenerative osteoarthritic changes of imaged joints. No radiopaque foreign bodies. Soft tissues are within normal limits. IMPRESSION: *No acute osseous abnormality of the right hand. Electronically Signed   By: Jules Schick M.D.   On: 05/31/2023 15:46    Procedures Procedures: not indicated.   Medications Ordered in ED Medications  ibuprofen (ADVIL) tablet 800 mg (800 mg Oral Given 05/31/23 1543)    ED Course/ Medical Decision Making/ A&P                                 Medical Decision Making Amount and/or Complexity of Data Reviewed Radiology: ordered.  Risk Prescription drug management.   This patient presents to the ED for concern of hand injury, this involves an extensive number of treatment options, and is a complaint that carries with it a high risk of complications and morbidity.   Differential diagnosis includes: fracture, dislocation, contusion, abrasion, etc.   Comorbidities  See HPI above   Additional History  Additional history obtained from primary care note.   Imaging Studies  I ordered imaging studies including right hand, wrist, forearm, and elbow x-rays.  I independently visualized and interpreted imaging which showed:  Subtle lucency in the medial aspect of the distal radius may be a vascular channel, however nondisplaced fracture is difficult to completely exclude. Otherwise, no acute fracture or  dislocation of the right wrist, forearm, or elbow. Mild soft tissue swelling overlying the olecranon. I agree with the radiologist interpretation   Problem List / ED Course / Critical Interventions / Medication Management  Right hand bruising/swelling after fall 3 weeks ago. I spoke with patient's son and  I offered ice for swelling with patient's and said that she did not tolerate that yesterday and does not think she would tolerate it today. I ordered medications including: Ibuprofen for  pain  Discussed case with my attending, Dr. Silverio Lay, who agreed we should splint patient's wrist and have her follow closely with orthopedics for reevaluation. Volar splint applied to right wrist prior to discharge.  Information for hand surgery provided for close follow-up.   Social Determinants of Health  Physical activity   Test / Admission - Considered  Discussed findings with patient and son at bedside. She is hemodynamically stable and safe for discharge home.  Information for hand surgery provided for follow-up. Return precautions provided.       Final Clinical Impression(s) / ED Diagnoses Final diagnoses:  Contusion of right hand, initial encounter  Fall, initial encounter  Closed nondisplaced fracture of styloid process of right radius, initial encounter    Rx / DC Orders ED Discharge Orders     None         Maxwell Marion, PA-C 05/31/23 2110    Maxwell Marion, PA-C 05/31/23 2231    Charlynne Pander, MD 05/31/23 2250

## 2023-05-31 NOTE — ED Triage Notes (Signed)
Pt arrived via POV with son. C/o R hand swelling and discoloration that began yesterday. Pt injured hand 3x weeks ago, and per son it was healing really well.  Pt hx of severe dementia, at baseline

## 2023-06-02 DIAGNOSIS — M25562 Pain in left knee: Secondary | ICD-10-CM | POA: Diagnosis not present

## 2023-06-02 DIAGNOSIS — M170 Bilateral primary osteoarthritis of knee: Secondary | ICD-10-CM | POA: Diagnosis not present

## 2023-06-02 DIAGNOSIS — M25561 Pain in right knee: Secondary | ICD-10-CM | POA: Diagnosis not present

## 2023-06-11 DIAGNOSIS — M25562 Pain in left knee: Secondary | ICD-10-CM | POA: Insufficient documentation

## 2023-06-12 DIAGNOSIS — M25531 Pain in right wrist: Secondary | ICD-10-CM | POA: Diagnosis not present

## 2023-06-16 DIAGNOSIS — M6281 Muscle weakness (generalized): Secondary | ICD-10-CM | POA: Diagnosis not present

## 2023-06-16 DIAGNOSIS — M25631 Stiffness of right wrist, not elsewhere classified: Secondary | ICD-10-CM | POA: Diagnosis not present

## 2023-06-19 ENCOUNTER — Other Ambulatory Visit: Payer: Self-pay | Admitting: Hematology and Oncology

## 2023-06-19 DIAGNOSIS — M6281 Muscle weakness (generalized): Secondary | ICD-10-CM | POA: Diagnosis not present

## 2023-06-19 DIAGNOSIS — M79604 Pain in right leg: Secondary | ICD-10-CM | POA: Diagnosis not present

## 2023-06-20 DIAGNOSIS — M25631 Stiffness of right wrist, not elsewhere classified: Secondary | ICD-10-CM | POA: Insufficient documentation

## 2023-06-20 DIAGNOSIS — M6281 Muscle weakness (generalized): Secondary | ICD-10-CM | POA: Insufficient documentation

## 2023-06-22 DIAGNOSIS — N3941 Urge incontinence: Secondary | ICD-10-CM | POA: Diagnosis not present

## 2023-06-22 DIAGNOSIS — R350 Frequency of micturition: Secondary | ICD-10-CM | POA: Diagnosis not present

## 2023-06-26 DIAGNOSIS — M1711 Unilateral primary osteoarthritis, right knee: Secondary | ICD-10-CM | POA: Diagnosis not present

## 2023-06-26 DIAGNOSIS — M5416 Radiculopathy, lumbar region: Secondary | ICD-10-CM | POA: Insufficient documentation

## 2023-06-27 ENCOUNTER — Telehealth: Payer: Self-pay | Admitting: Neurology

## 2023-06-27 DIAGNOSIS — M25631 Stiffness of right wrist, not elsewhere classified: Secondary | ICD-10-CM | POA: Diagnosis not present

## 2023-06-27 DIAGNOSIS — M6281 Muscle weakness (generalized): Secondary | ICD-10-CM | POA: Diagnosis not present

## 2023-06-27 NOTE — Telephone Encounter (Signed)
I failed to reach patient by calling to numbers listed in the chart please try to reach out to her husband again  When I saw her on February 20, 2023, for moderate to severe dementia, she was noted to have mild right arm weakness, dragging right leg when ambulating,  For that reason we had MRI of the brain on February 28, 2023, which showed chronic change, there was no evidence of stroke     MRI brain without contrast demonstrating: -Mild perisylvian and moderate mesial temporal and severe left anterior temporal atrophy. -Mild chronic small vessel ischemic disease. -Single punctate right cerebellar chronic cerebral microhemorrhage. -No acute findings.  Her right side difficulty can due to worsening degeneration on her left side of the brain  If this is chronic findings, we do not have to repeat MRI again, if her husband really worry about her symptoms, think is a relative new change, I am not against order repeat MRI  The best approach is to wait until she is reevaluated at next follow-up visit in April

## 2023-06-27 NOTE — Telephone Encounter (Signed)
Pt's husband states pt is having a hard time with the mobility of the right side of her body. States pt drags her right leg and can barely lift her right arm. Requesting call back to discuss

## 2023-06-27 NOTE — Telephone Encounter (Signed)
Call to patient, no answer, left message to call back. Okay to speak to caregiver Joni Reining.

## 2023-06-27 NOTE — Telephone Encounter (Signed)
Call to husband, he asked me to speak to his caregiver Joni Reining, who states patient is having ongoing weakness in the right side. She denies any acute stroke symptoms and says patient is at her baseline with the exception of seeming weaker. She has been doing PT that doesn't seem to helping. Caregiver Joni Reining is asking if another CT or MRI is needed.  Advised I would send to Dr. Terrace Arabia for review.

## 2023-06-28 NOTE — Telephone Encounter (Signed)
Maria Bolton returned my call and I reviewed Dr. Terrace Arabia notes and Maria Bolton in agreement to wait until next visit and add to wait list. She is aware to call with acute changes.

## 2023-06-29 DIAGNOSIS — R350 Frequency of micturition: Secondary | ICD-10-CM | POA: Diagnosis not present

## 2023-06-29 DIAGNOSIS — N3941 Urge incontinence: Secondary | ICD-10-CM | POA: Diagnosis not present

## 2023-06-30 DIAGNOSIS — M79604 Pain in right leg: Secondary | ICD-10-CM | POA: Diagnosis not present

## 2023-06-30 DIAGNOSIS — M6281 Muscle weakness (generalized): Secondary | ICD-10-CM | POA: Diagnosis not present

## 2023-06-30 NOTE — Telephone Encounter (Signed)
VOB initiated for [place] for BILAT knee OA

## 2023-07-04 DIAGNOSIS — M25631 Stiffness of right wrist, not elsewhere classified: Secondary | ICD-10-CM | POA: Diagnosis not present

## 2023-07-04 DIAGNOSIS — M6281 Muscle weakness (generalized): Secondary | ICD-10-CM | POA: Diagnosis not present

## 2023-07-06 DIAGNOSIS — R350 Frequency of micturition: Secondary | ICD-10-CM | POA: Diagnosis not present

## 2023-07-06 DIAGNOSIS — N3941 Urge incontinence: Secondary | ICD-10-CM | POA: Diagnosis not present

## 2023-07-06 NOTE — Telephone Encounter (Signed)
Per [employer], [employer] policy has termed.   Update insurance.

## 2023-07-07 DIAGNOSIS — M6281 Muscle weakness (generalized): Secondary | ICD-10-CM | POA: Diagnosis not present

## 2023-07-07 DIAGNOSIS — M79604 Pain in right leg: Secondary | ICD-10-CM | POA: Diagnosis not present

## 2023-07-07 NOTE — Telephone Encounter (Signed)
We will wait till patient reaches out, patient has not been seen in the new year.

## 2023-07-11 ENCOUNTER — Telehealth: Payer: Self-pay | Admitting: Physical Therapy

## 2023-07-11 ENCOUNTER — Encounter: Payer: Self-pay | Admitting: Physical Therapy

## 2023-07-11 ENCOUNTER — Ambulatory Visit: Payer: Self-pay | Attending: Orthopedic Surgery | Admitting: Physical Therapy

## 2023-07-11 DIAGNOSIS — R262 Difficulty in walking, not elsewhere classified: Secondary | ICD-10-CM | POA: Diagnosis not present

## 2023-07-11 DIAGNOSIS — R269 Unspecified abnormalities of gait and mobility: Secondary | ICD-10-CM | POA: Insufficient documentation

## 2023-07-11 NOTE — Telephone Encounter (Signed)
 Dr. Alyse,  Erminio Core was evaluated by outpatient neuro physical therapy on 07/11/2023.  The patient would benefit from home health PT, OT, and SLP evaluation for caregiver training on safety with transfers, UE management techniques, and memory training. We do not advise outpatient at this time as not appropriate for patient congition level. We also reached out to neurology for further neuro workup.  If you agree, please place an order for these three disciplines for home health.   Thank you, Lauraine Grumbling, PT, DPT   [facility] [address] [place], [place]  [address] Phone:  [phone] Fax:  [phone]

## 2023-07-11 NOTE — Therapy (Signed)
 OUTPATIENT PHYSICAL THERAPY NEURO EVALUATION   Patient Name: Maria Bolton MRN: [id number] DOB:1945-08-01, 78 y.o., female Today's Date: 07/11/2023   PCP: Clarice Nottingham, MD REFERRING PROVIDER: Alyse Agent, MD  END OF SESSION:  PT End of Session - 07/11/23 1412     Visit Number 1    Number of Visits 1    Date for PT Re-Evaluation 07/11/23    Authorization Type None    PT Start Time 1325    PT Stop Time 1410    PT Time Calculation (min) 45 min    Equipment Utilized During Treatment Gait belt    Activity Tolerance Patient limited by pain;Other (comment)   limited by cognition   Behavior During Therapy Flat affect   confused            Past Medical History:  Diagnosis Date   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 , ER/PR+   Chronic diastolic heart failure (HCC)    Difficulty sleeping    since dx of cancer   Hyperlipidemia    Hypertension 05/11/2011   Memory loss    Past Surgical History:  Procedure Laterality Date   BREAST LUMPECTOMY  05/16/2011   Procedure: LUMPECTOMY;  Surgeon: Alm VEAR Angle, MD;  Location: [facility];  Service: General;  Laterality: Left;  Needle localization Left breast lumpectomy   BREAST LUMPECTOMY WITH RADIOACTIVE SEED AND SENTINEL LYMPH NODE BIOPSY Left 10/26/2021   Procedure: LEFT BREAST LUMPECTOMY WITH RADIOACTIVE SEED AND SENTINEL LYMPH NODE BIOPSY;  Surgeon: Aron Shoulders, MD;  Location: [facility];  Service: General;  Laterality: Left;   HIP SURGERY  1982   NEVUS EXCISION  05/16/2011   Procedure: NEVUS EXCISION;  Surgeon: Alm VEAR Angle, MD;  Location:  SUR[facility]ERY C[facility];  Service: General;  Laterality: Left;  remove mole left breast   Patient Active Problem List   Diagnosis Date Noted   Chronic pain of both knees 03/29/2023   Primary osteoarthritis of both knees 03/29/2023   Right sided weakness 02/20/2023   Gait abnormality 02/20/2023   Genetic testing 12/03/2021   Family history of  breast cancer 11/18/2021   Malignant neoplasm of lower-inner quadrant of left breast in female, estrogen receptor positive (HCC) 10/18/2021   Severe late onset Alzheimer's dementia (HCC) 02/02/2021   Abnormal thyroid  blood test 12/27/2016   Abnormal glucose 09/06/2016   Mild cognitive impairment 09/06/2016   Fever 09/19/2015   Acute bronchitis 09/19/2015   Anxiety about health 09/19/2015   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   Hypertension 05/11/2011    ONSET DATE: 07/07/2023 (referral date)   REFERRING DIAG: M79.604 (ICD-10-CM) - Pain of right leg  THERAPY DIAG:  Abnormality of gait and mobility - Plan: PT plan of care cert/re-cert  Difficulty in walking, not elsewhere classified - Plan: PT plan of care cert/re-cert  Rationale for Evaluation and Treatment: Rehabilitation  SUBJECTIVE:  SUBJECTIVE STATEMENT: Patient is unable to provide any of subjective and is largely nonverbal given cognition. Subjective provided largely by caregiver.   Per caregiver, starting in 2024, patient started going down hill with dementia and alzhemiers. They did MRI as patient was not able to move R  arm. MRI did not show signs of a stroke per caregiver and did not explain weakness. Caregiver reports that they had home health physical therapy about a year ago and were sent to outpatient neuro PT to collaborate with numerology to figure out what was going on with the patient. There is no explanation per caregiver to explain degree of non use of R UE; a hairline fracture was found on this side but seems to be fully healed per caregiver and patient has no weight bearing restrictions. Caregiver reports they were hoping to figure out why patient has been so weak today. Patient is primarily in transport chair  during the day and sometimes stands at walker but this is where she is having all of her falls.     Pt accompanied by: self and caregiver, spouse   PERTINENT HISTORY: hairline fx of R wrist since healed per caregiver, osteoarthritis of knees, dementia and Alzheimer Disease    PAIN:  Are you having pain? Yes - patient is non verbal but rep  PRECAUTIONS: Fall  RED FLAGS: None   WEIGHT BEARING RESTRICTIONS:  None per caregiver - some swelling in RUE and difficulty putting weight through this side  FALLS: Has patient fallen in last 6 months? Yes. Number of falls unsure, sounds like multiple when patient gets up on own  LIVING ENVIRONMENT: Lives with: lives with their spouse and caregiver there from early morning to 8:00pm Lives in: House/apartment Stairs: Not captured on eval Has following equipment at home: Vannie - 4 wheeled and Wheelchair (manual)  PLOF: Needs assistance with ADLs and Needs assistance with transfers  PATIENT GOALS: Unable to state; per caregiver there goal is to figure out what is going on   OBJECTIVE:  Note: Objective measures were completed at Evaluation unless otherwise noted.  DIAGNOSTIC FINDINGS:  MR Brain 02/28/2023  MRI brain without contrast demonstrating: -Mild perisylvian and moderate mesial temporal and severe left anterior temporal atrophy. -Mild chronic small vessel ischemic disease. -Single punctate right cerebellar chronic cerebral microhemorrhage. -No acute findings.    COGNITION: Overall cognitive status: Impaired   SENSATION: Unable to accurately assess   COORDINATION: Unable to accurately assess, patient unable to follow simple commands consistently   EDEMA:  RUE edema down into fingers from shoulder 1+ - 2  MUSCLE TONE: Low tone in RUE  POSTURE: Tends to hand R UE out of chair and not use at all  LOWER EXTREMITY ROM:     Unable to accurately assess as patient unable to follow commands   LOWER EXTREMITY MMT:    Unable  to accurately assess as patient unable to follow commands  but able to move against gravity with major muscle groups   TRANSFERS: Assistive device utilized:  transport chair and 2WW (as this is what patient is using at home)   Sit to stand: Mod A of 2 from caregivers, patient refuses to stand for PT and requires modA of 2 from caregiver, transfer technique by caregivers appears less than ideal for safety as grab under arms and pull up and are limited by patient highly retropulsive tendencies, would recommend HH PT for education on safety with transfers in home environment   Stand to sit: Mod A -  nearly no eccentric control, poor safety awareness  Chair to chair: Unable to assess as unsafe GAIT: Unable to assess as patient has nearly total nonuse of RUE and is unsafe to take step in session given degree of difficulty following commands and not being able to use RUE, walker not appropriate for patient at this time and made family aware  FUNCTIONAL TESTS:  Not appropriate given cognition and inability to follow multiple commands  PATIENT SURVEYS:  Not appropriate given cognition and inability to follow multiple commands                                                                                                                              TREATMENT:    Initial Eval only  PATIENT EDUCATION: Education details: Examination findings, recommend follow up with neurology for unexplained RUE weakness and advise HH OT, PT, and SLP primarily for caregiver trianing and education in home environment to improve safety, discussed considering memory or long term care given degree of patient impairment  Person educated: Patient, Caregiver care aid, and spouse  Education method: Explanation Education comprehension: verbalized understanding  HOME EXERCISE PROGRAM: Not indicated on eval  GOALS: Not indicated on eval  ASSESSMENT:  CLINICAL IMPRESSION: Patient is a 78 y.o. female who was seen today  for physical therapy evaluation and treatment for lower extremity weakness with severe cognitive impairment related to Alzheimer's disease and dementia. Patient with new onset R UE non use within last year that is unexplained per MRI results per caregiver as recent stroke ruled out. Caregiver and family hoping for diagnosis or explanation for UE weakness but PT explained unable to provide diagnosis as would require further medical evaluation. Patient is not an appropriate candidate for outpatient PT services at this time given degree of cognitive impairment, inability to follow commands, and would best benefit from follow up for neurology for further medical workup of impaired RUE as well as home health PT, OT, and SLP primarily for caregiver education and training for safety with functional tasks within home as caregiver transfer concerning on eval. Educated that would advise against patient using 2WW at this time as unsafe with the device and recommend HH for work on functional transfers in home. Discussed memory or long term care as well as aid through night given degree of patient impairment. Family and spouse verbalized understanding.   OBJECTIVE IMPAIRMENTS: difficulty walking, decreased ROM, decreased strength, impaired tone, and pain.   ACTIVITY LIMITATIONS: carrying, lifting, standing, squatting, transfers, dressing, and locomotion level  PARTICIPATION LIMITATIONS: meal prep, cleaning, laundry, shopping, and community activity  PERSONAL FACTORS: Age, Time since onset of injury/illness/exacerbation, and 3+ comorbidities: see above  are also affecting patient's functional outcome.   REHAB POTENTIAL: Poor limited by degree of cognitive impairment   CLINICAL DECISION MAKING: Unstable/unpredictable  EVALUATION COMPLEXITY: High  PLAN: Recommend neurology workup for unexplained RUE weakness, home health SLP, OT, and PT primarily for caregiver training in home for safety,  consider long term care or  memory care as needed   Lauraine DELENA Grumbling, PT, DPT 07/11/2023, 3:22 PM

## 2023-07-13 DIAGNOSIS — N3941 Urge incontinence: Secondary | ICD-10-CM | POA: Diagnosis not present

## 2023-07-13 DIAGNOSIS — R350 Frequency of micturition: Secondary | ICD-10-CM | POA: Diagnosis not present

## 2023-07-20 DIAGNOSIS — R350 Frequency of micturition: Secondary | ICD-10-CM | POA: Diagnosis not present

## 2023-07-20 DIAGNOSIS — N3941 Urge incontinence: Secondary | ICD-10-CM | POA: Diagnosis not present

## 2023-07-25 DIAGNOSIS — F03B0 Unspecified dementia, moderate, without behavioral disturbance, psychotic disturbance, mood disturbance, and anxiety: Secondary | ICD-10-CM | POA: Diagnosis not present

## 2023-07-25 DIAGNOSIS — R531 Weakness: Secondary | ICD-10-CM | POA: Diagnosis not present

## 2023-07-25 DIAGNOSIS — I10 Essential (primary) hypertension: Secondary | ICD-10-CM | POA: Diagnosis not present

## 2023-08-01 DIAGNOSIS — M6281 Muscle weakness (generalized): Secondary | ICD-10-CM | POA: Diagnosis not present

## 2023-08-01 DIAGNOSIS — M25631 Stiffness of right wrist, not elsewhere classified: Secondary | ICD-10-CM | POA: Diagnosis not present

## 2023-08-03 DIAGNOSIS — R350 Frequency of micturition: Secondary | ICD-10-CM | POA: Diagnosis not present

## 2023-08-03 DIAGNOSIS — N3941 Urge incontinence: Secondary | ICD-10-CM | POA: Diagnosis not present

## 2023-08-09 DIAGNOSIS — M25631 Stiffness of right wrist, not elsewhere classified: Secondary | ICD-10-CM | POA: Diagnosis not present

## 2023-08-09 DIAGNOSIS — M6281 Muscle weakness (generalized): Secondary | ICD-10-CM | POA: Diagnosis not present

## 2023-08-10 DIAGNOSIS — R350 Frequency of micturition: Secondary | ICD-10-CM | POA: Diagnosis not present

## 2023-08-10 DIAGNOSIS — R600 Localized edema: Secondary | ICD-10-CM | POA: Insufficient documentation

## 2023-08-10 DIAGNOSIS — M25531 Pain in right wrist: Secondary | ICD-10-CM | POA: Diagnosis not present

## 2023-08-10 DIAGNOSIS — N3941 Urge incontinence: Secondary | ICD-10-CM | POA: Diagnosis not present

## 2023-08-11 DIAGNOSIS — M5416 Radiculopathy, lumbar region: Secondary | ICD-10-CM | POA: Diagnosis not present

## 2023-08-14 DIAGNOSIS — M25631 Stiffness of right wrist, not elsewhere classified: Secondary | ICD-10-CM | POA: Diagnosis not present

## 2023-08-14 DIAGNOSIS — M6281 Muscle weakness (generalized): Secondary | ICD-10-CM | POA: Diagnosis not present

## 2023-08-15 NOTE — Progress Notes (Addendum)
 Chief Complaint  Patient presents with   Follow-up    Pt in 15, here with Maria Bolton, and caretaker Maria Bolton  Pt is here following up on mild cognitive impairment. Pt's care taker states pt is not using her right side, or walking, right arm is swollen. States that the pt is not doing any talking. States pt needs PT, OP and speech therapy.      ASSESSMENT AND PLAN  Maria Bolton is a 78 y.o. female   Worsening dementia Acute onset right side difficulty since June 2024  -Worsening right-sided weakness since MRI in September 2024 showing severe left temporal atrophy.  Will repeat MRI of the brain for this reason, family has seen significant decline in ability to use the right side, speech, gait.  Order Xanax  for sedation prior to MRI. -I will order home health nursing, speech, physical, occupational therapy. Family will let me know agency of preference to place the orders -MMSE 13/30 in September 2024 -Follow up with primary care about swelling to right arm -Can continue Aricept  10 mg daily, Namenda  10 mg twice a day -Follow-up in about 4 months with Maria Bolton, they wish to see MariaYan   Addendum 10/18/23 SS: Patient has mobility limitation that significantly impairs her ability to participate in 1 or more mobility related activities of daily living MRADL in the home & a mobility limitation that prevents the patient from accomplishing MRADL entirely. Patient has a caregiver to ensure safety while using wheelchair. It cannot be resolved with a walker or a cane.   I also sent letter on 10/04/23 for [facility] Lift: To whom it may concern,   It is my professional opinion that Maria Bolton's medical condition justifies medical necessity for a mechanical lift in the home.  She has severe right sided weakness making her dependent on others for mobility/repositioning.  A mechanical lift is needed to assist with transfers as she is unable to move independently.  Periodic movement is necessary to prevent further  deterioration of her condition.  Please contact me if you have any questions.  Sincerely,  Wess Hammed, NP  Meds ordered this encounter  Medications   ALPRAZolam  (XANAX ) 0.5 MG tablet    Sig: Take 1-2 tablets 30-45 minutes before MRI, may take an extra if needed    Dispense:  3 tablet    Refill:  0   Orders Placed This Encounter  Procedures   MR BRAIN WO CONTRAST   DIAGNOSTIC DATA (LABS, IMAGING, TESTING) - I reviewed patient records, labs, notes, testing and imaging myself where available. Laboratory evaluations in December 2024, equal physician, normal CBC CMP of 14.3, CMP creatinine of 0.77, normal liver functional test A1c was 5.3, LDL was 88, normal TSH 1.69,  MEDICAL HISTORY:  Maria Bolton, is 78 year old female accompanied by her husband, follow-up for worsening dementia,  I reviewed and summarized the referring note. PMHX. 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  Husband reported in March 2024, she had acute onset right arm weakness, increased gait abnormality, today's examination showed right arm and leg  weakness,  Update August 16, 2023 SS: MRI of the brain in September 2024 showed atrophy most severe at left temporal area (ordered for right arm weakness). Here with caregiver, Maria Bolton and husband. Continues with right sided weakness, swelling to right side, not talking as much. Is not doing any walking, for past few months.Had a fall in Dec had right wrist fracture, did PT/OT, working on upper extremities, cognitive brain stimulator. Right arm has been swollen since December, PT doing lymphedema. Just ordered a hospital bed. Limited verbal response, yes and no. Gets acupuncture to feet for urinary incontinence with very good benefit, mood is good always. Needs helps with all ADLs, can feed herself with her left arm. Has bowel incontinence. Has compression sleeve to use on right arm.   PHYSICAL EXAM:   Vitals:   08/16/23 0952  BP: (!) 140/68  Pulse: 71  Weight: 200 lb (90.7 kg)  Height: 5\' 6"  (1.676 m)   Body mass index is 32.28 kg/m.     02/20/2023    1:13 PM 02/03/2020   10:42 AM 05/20/2019    1:06 PM  MMSE - Mini Mental State Exam  Not completed:   --  Orientation to time 2 4 4   Orientation to Place 1 3 3   Registration 3 3 3   Attention/ Calculation 0 5 2  Recall 0 3 1  Language- name 2 objects 2 2 2   Language- repeat 1 1 1   Language- follow 3 step command 3 3 2   Language- follow 3 step command-comments   she grabbed it with her left hand  Language- read & follow direction 1 1 1   Write a sentence 0 1 1  Copy design 0 1 1  Total score 13 27 21      Physical Exam  General: The patient is alert and cooperative at the time of the examination.  Elderly female, smiling, seated upright in wheelchair.  Skin: Swelling noted to right extremity from elbow down to hand  Neurologic Exam  Mental status: Alert, cooperative, responds with yes and no answers.  Smiles.  Pleasant.  Family provides history.  Moderate difficulty following exam commands.  Cranial nerves: Limited speech, mild  facial asymmetry to the right mouth  Motor: Significant weakness to the right arm, no antigravity movement, right leg 2/5, increased tone to right arm and leg  Sensory examination: Soft touch sensation is symmetric on the face, arms, and legs.  Coordination: Could not perform  Gait and station: Nonambulatory  REVIEW OF SYSTEMS:  Full 14 system review of systems performed and notable only for as above All other review of systems were negative.   ALLERGIES: No Known Allergies  HOME MEDICATIONS: Current Outpatient Medications  Medication Sig Dispense Refill   cholecalciferol  (VITAMIN D ) 1000 units tablet Take 1,000 Units by mouth daily.     donepezil  (ARICEPT ) 10 MG tablet Take 1 tablet (10 mg total) by mouth at bedtime. 90 tablet 3   letrozole  (FEMARA ) 2.5 MG tablet TAKE 1 TABLET(2.5 MG) BY MOUTH DAILY 90 tablet 0   levothyroxine  (SYNTHROID) 25 MCG tablet Take 25 mcg by mouth  every morning.     losartan  (COZAAR ) 100 MG tablet Take 100 mg by mouth daily.     memantine  (NAMENDA ) 10 MG tablet Take 1 tablet (10 mg total) by mouth 2 (two) times daily. 180 tablet 4   metoprolol  succinate (TOPROL -XL) 25 MG 24 hr tablet Take 1 tablet (25 mg total) by mouth daily. Take with or immediately following a meal. 90 tablet 3   mirabegron ER (MYRBETRIQ) 50 MG TB24 tablet Take 50 mg by mouth daily.     oxyCODONE  (OXY IR/ROXICODONE ) 5 MG immediate release tablet Take 1 tablet (5 mg total) by mouth every 6 (six) hours as needed for severe pain. 15 tablet 0   rosuvastatin  (CRESTOR ) 10 MG tablet Take 10 mg by mouth daily.     No current facility-administered medications for this visit.    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 , ER/PR+   Chronic diastolic heart failure (HCC)    Difficulty sleeping    since dx of cancer   Hyperlipidemia    Hypertension 05/11/2011   Memory loss     PAST SURGICAL HISTORY: Past Surgical  History:  Procedure Laterality Date   BREAST LUMPECTOMY  05/16/2011   Procedure: LUMPECTOMY;  Surgeon: Thayne Fine, MD;  Location: [facility];  Service: General;  Laterality: Left;  Needle localization Left breast lumpectomy   BREAST LUMPECTOMY WITH RADIOACTIVE SEED AND SENTINEL LYMPH NODE BIOPSY Left 10/26/2021   Procedure: LEFT BREAST LUMPECTOMY WITH RADIOACTIVE SEED AND SENTINEL LYMPH NODE BIOPSY;  Surgeon: Lockie Rima, MD;  Location: [facility];  Service: General;  Laterality: Left;   HIP SURGERY  1982   NEVUS EXCISION  05/16/2011   Procedure: NEVUS EXCISION;  Surgeon: Thayne Fine, MD;  Location: [facility];  Service: General;  Laterality: Left;  remove mole left breast    FAMILY HISTORY: Family History  Problem Relation Age of Onset   Cancer Mother 83       breast ca   Alzheimer's disease Father    Breast cancer Maternal Aunt    Breast cancer Cousin     SOCIAL HISTORY: Social History   Socioeconomic History   Marital status: Married    Spouse name: Not on file   Number of children: 1   Years of education: HS   Highest education level: Not on file  Occupational History   Occupation: Retired  Tobacco Use   Smoking status: Former    Current packs/day: 0.00    Average packs/day: 1 pack/day for 46.0 years (46.0 ttl pk-yrs)    Types: Cigarettes    Start date: 11/05/1963    Quit date: 11/04/2009    Years since quitting: 13.7   Smokeless tobacco: Never  Vaping Use   Vaping status: Never Used  Substance and Sexual Activity   Alcohol use: No   Drug use: No   Sexual activity: Yes  Other Topics Concern   Not on file  Social History Narrative   Lives at home with husband.   Right-handed.   No caffeine use.   Social Drivers of Corporate investment banker Strain: Not on file  Food Insecurity: Not on file  Transportation Needs: Not on file  Physical Activity: Not on file  Stress: Not on file  Social Connections: Not on  file  Intimate Partner Violence: Not on file   Jeanmarie Millet, Maritza Sidles, [place]  [facility] [address], [address] [place], [place] [address] [phone]

## 2023-08-16 ENCOUNTER — Encounter: Payer: Self-pay | Admitting: Neurology

## 2023-08-16 ENCOUNTER — Ambulatory Visit: Admitting: Neurology

## 2023-08-16 VITALS — BP 140/68 | HR 71 | Ht 66.0 in | Wt 200.0 lb

## 2023-08-16 DIAGNOSIS — G301 Alzheimer's disease with late onset: Secondary | ICD-10-CM

## 2023-08-16 DIAGNOSIS — F02C0 Dementia in other diseases classified elsewhere, severe, without behavioral disturbance, psychotic disturbance, mood disturbance, and anxiety: Secondary | ICD-10-CM | POA: Diagnosis not present

## 2023-08-16 DIAGNOSIS — R531 Weakness: Secondary | ICD-10-CM

## 2023-08-16 DIAGNOSIS — R269 Unspecified abnormalities of gait and mobility: Secondary | ICD-10-CM

## 2023-08-16 MED ORDER — ALPRAZOLAM 0.5 MG PO TABS
ORAL_TABLET | ORAL | 0 refills | Status: AC
Start: 2023-08-16 — End: ?

## 2023-08-16 NOTE — Patient Instructions (Addendum)
 I will order MRI brain, Will send Xanax for MRI sedation  Continue memory medications Let me know which agency to send home health order  Follow up with primary care about swelling to right arm  Meds ordered this encounter  Medications   ALPRAZolam (XANAX) 0.5 MG tablet    Sig: Take 1-2 tablets 30-45 minutes before MRI, may take an extra if needed    Dispense:  3 tablet    Refill:  0

## 2023-08-17 DIAGNOSIS — N3941 Urge incontinence: Secondary | ICD-10-CM | POA: Diagnosis not present

## 2023-08-17 DIAGNOSIS — R350 Frequency of micturition: Secondary | ICD-10-CM | POA: Diagnosis not present

## 2023-08-21 DIAGNOSIS — M25631 Stiffness of right wrist, not elsewhere classified: Secondary | ICD-10-CM | POA: Diagnosis not present

## 2023-08-21 DIAGNOSIS — M6281 Muscle weakness (generalized): Secondary | ICD-10-CM | POA: Diagnosis not present

## 2023-08-23 DIAGNOSIS — R296 Repeated falls: Secondary | ICD-10-CM | POA: Diagnosis not present

## 2023-08-23 DIAGNOSIS — R29898 Other symptoms and signs involving the musculoskeletal system: Secondary | ICD-10-CM | POA: Diagnosis not present

## 2023-08-24 DIAGNOSIS — R350 Frequency of micturition: Secondary | ICD-10-CM | POA: Diagnosis not present

## 2023-08-24 DIAGNOSIS — N3941 Urge incontinence: Secondary | ICD-10-CM | POA: Diagnosis not present

## 2023-08-25 ENCOUNTER — Telehealth: Payer: Self-pay | Admitting: Neurology

## 2023-08-25 NOTE — Telephone Encounter (Signed)
 no auth required sent to [facility] [phone]

## 2023-08-30 DIAGNOSIS — L89150 Pressure ulcer of sacral region, unstageable: Secondary | ICD-10-CM | POA: Diagnosis not present

## 2023-08-30 DIAGNOSIS — F419 Anxiety disorder, unspecified: Secondary | ICD-10-CM | POA: Diagnosis not present

## 2023-08-30 DIAGNOSIS — E669 Obesity, unspecified: Secondary | ICD-10-CM | POA: Diagnosis not present

## 2023-08-30 DIAGNOSIS — C50919 Malignant neoplasm of unspecified site of unspecified female breast: Secondary | ICD-10-CM | POA: Diagnosis not present

## 2023-08-30 DIAGNOSIS — G4700 Insomnia, unspecified: Secondary | ICD-10-CM | POA: Diagnosis not present

## 2023-08-30 DIAGNOSIS — I700 Atherosclerosis of aorta: Secondary | ICD-10-CM | POA: Diagnosis not present

## 2023-08-30 DIAGNOSIS — E785 Hyperlipidemia, unspecified: Secondary | ICD-10-CM | POA: Diagnosis not present

## 2023-08-30 DIAGNOSIS — F0284 Dementia in other diseases classified elsewhere, unspecified severity, with anxiety: Secondary | ICD-10-CM | POA: Diagnosis not present

## 2023-08-30 DIAGNOSIS — E559 Vitamin D deficiency, unspecified: Secondary | ICD-10-CM | POA: Diagnosis not present

## 2023-08-30 DIAGNOSIS — G309 Alzheimer's disease, unspecified: Secondary | ICD-10-CM | POA: Diagnosis not present

## 2023-08-30 DIAGNOSIS — I10 Essential (primary) hypertension: Secondary | ICD-10-CM | POA: Diagnosis not present

## 2023-08-30 DIAGNOSIS — G8929 Other chronic pain: Secondary | ICD-10-CM | POA: Diagnosis not present

## 2023-08-31 DIAGNOSIS — R350 Frequency of micturition: Secondary | ICD-10-CM | POA: Diagnosis not present

## 2023-08-31 DIAGNOSIS — M170 Bilateral primary osteoarthritis of knee: Secondary | ICD-10-CM | POA: Diagnosis not present

## 2023-08-31 DIAGNOSIS — N3941 Urge incontinence: Secondary | ICD-10-CM | POA: Diagnosis not present

## 2023-09-05 ENCOUNTER — Ambulatory Visit (HOSPITAL_COMMUNITY)
Admission: RE | Admit: 2023-09-05 | Discharge: 2023-09-05 | Disposition: A | Discharge status: Home / Self Care | Payer: Self-pay | Source: Ambulatory Visit | Attending: Neurology | Admitting: Neurology

## 2023-09-05 DIAGNOSIS — G309 Alzheimer's disease, unspecified: Secondary | ICD-10-CM | POA: Diagnosis not present

## 2023-09-05 DIAGNOSIS — I639 Cerebral infarction, unspecified: Secondary | ICD-10-CM | POA: Diagnosis not present

## 2023-09-05 DIAGNOSIS — R269 Unspecified abnormalities of gait and mobility: Secondary | ICD-10-CM | POA: Insufficient documentation

## 2023-09-05 DIAGNOSIS — R531 Weakness: Secondary | ICD-10-CM | POA: Diagnosis not present

## 2023-09-05 DIAGNOSIS — J320 Chronic maxillary sinusitis: Secondary | ICD-10-CM | POA: Diagnosis not present

## 2023-09-05 DIAGNOSIS — R9089 Other abnormal findings on diagnostic imaging of central nervous system: Secondary | ICD-10-CM | POA: Diagnosis not present

## 2023-09-06 ENCOUNTER — Telehealth: Admitting: Physician Assistant

## 2023-09-06 DIAGNOSIS — J208 Acute bronchitis due to other specified organisms: Secondary | ICD-10-CM

## 2023-09-06 DIAGNOSIS — B9689 Other specified bacterial agents as the cause of diseases classified elsewhere: Secondary | ICD-10-CM

## 2023-09-06 MED ORDER — BENZONATATE 100 MG PO CAPS: 100.0000 mg | ORAL_CAPSULE | Freq: Three times a day (TID) | ORAL | 0 refills | Status: AC | PRN

## 2023-09-06 MED ORDER — DOXYCYCLINE HYCLATE 100 MG PO TABS: 100.0000 mg | ORAL_TABLET | Freq: Two times a day (BID) | ORAL | 0 refills | Status: AC

## 2023-09-06 NOTE — Progress Notes (Signed)
 Virtual Visit Consent   Maria Bolton, you are scheduled for a virtual visit with a [facility] provider today. Just as with appointments in the office, your consent must be obtained to participate. Your consent will be active for this visit and any virtual visit you may have with one of our providers in the next 365 days. If you have a MyChart account, a copy of this consent can be sent to you electronically.  As this is a virtual visit, video technology does not allow for your provider to perform a traditional examination. This may limit your provider's ability to fully assess your condition. If your provider identifies any concerns that need to be evaluated in person or the need to arrange testing (such as labs, EKG, etc.), we will make arrangements to do so. Although advances in technology are sophisticated, we cannot ensure that it will always work on either your end or our end. If the connection with a video visit is poor, the visit may have to be switched to a telephone visit. With either a video or telephone visit, we are not always able to ensure that we have a secure connection.  By engaging in this virtual visit, you consent to the provision of healthcare and authorize for your insurance to be billed (if applicable) for the services provided during this visit. Depending on your insurance coverage, you may receive a charge related to this service.  I need to obtain your verbal consent now. Are you willing to proceed with your visit today? Maria Bolton has provided verbal consent on 09/06/2023 for a virtual visit (video or telephone). Piedad Climes, [place]  Date: 09/06/2023 11:40 AM   Virtual Visit via Video Note   I, Piedad Climes, connected with  Maria Bolton  ([id number], 11-27-1945) on 09/06/23 at 11:30 AM EDT by a video-enabled telemedicine application and verified that I am speaking with the correct person using two identifiers.  Location: Patient: Virtual Visit Location Patient:  Home Provider: Virtual Visit Location Provider: Home Office   I discussed the limitations of evaluation and management by telemedicine and the availability of in person appointments. The patient expressed understanding and agreed to proceed.    History of Present Illness: Maria Bolton is a 78 y.o. who identifies as a female who was assigned female at birth, and is being seen today for chest congestion and cough over the past 2 weeks, now with change in sputum to thick and green. Denies fever, chills, emesis. No change to hydration or diet. She has advanced dementia so does not speak much. History obtained from family and caregiver.  HPI: HPI  Problems:  Patient Active Problem List   Diagnosis Date Noted   Chronic pain of both knees 03/29/2023   Primary osteoarthritis of both knees 03/29/2023   Right sided weakness 02/20/2023   Gait abnormality 02/20/2023   Genetic testing 12/03/2021   Family history of breast cancer 11/18/2021   Malignant neoplasm of lower-inner quadrant of left breast in female, estrogen receptor positive (HCC) 10/18/2021   Severe late onset Alzheimer's dementia (HCC) 02/02/2021   Abnormal thyroid blood test 12/27/2016   Abnormal glucose 09/06/2016   Mild cognitive impairment 09/06/2016   Fever 09/19/2015   Acute bronchitis 09/19/2015   Anxiety about health 09/19/2015   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   Hypertension 05/11/2011    Allergies: No Known Allergies Medications:  Current Outpatient Medications:    benzonatate (TESSALON) 100 MG capsule, Take 1 capsule (100  mg total) by mouth 3 (three) times daily as needed for cough., Disp: 30 capsule, Rfl: 0   doxycycline (VIBRA-TABS) 100 MG tablet, Take 1 tablet (100 mg total) by mouth 2 (two) times daily., Disp: 14 tablet, Rfl: 0   ALPRAZolam (XANAX) 0.5 MG tablet, Take 1-2 tablets 30-45 minutes before MRI, may take an extra if needed, Disp: 3 tablet, Rfl: 0   cholecalciferol (VITAMIN D) 1000  units tablet, Take 1,000 Units by mouth daily., Disp: , Rfl:    donepezil (ARICEPT) 10 MG tablet, Take 1 tablet (10 mg total) by mouth at bedtime., Disp: 90 tablet, Rfl: 3   letrozole (FEMARA) 2.5 MG tablet, TAKE 1 TABLET(2.5 MG) BY MOUTH DAILY, Disp: 90 tablet, Rfl: 0   levothyroxine (SYNTHROID) 25 MCG tablet, Take 25 mcg by mouth every morning., Disp: , Rfl:    losartan (COZAAR) 100 MG tablet, Take 100 mg by mouth daily., Disp: , Rfl:    memantine (NAMENDA) 10 MG tablet, Take 1 tablet (10 mg total) by mouth 2 (two) times daily., Disp: 180 tablet, Rfl: 4   metoprolol succinate (TOPROL-XL) 25 MG 24 hr tablet, Take 1 tablet (25 mg total) by mouth daily. Take with or immediately following a meal., Disp: 90 tablet, Rfl: 3   mirabegron ER (MYRBETRIQ) 50 MG TB24 tablet, Take 50 mg by mouth daily., Disp: , Rfl:    oxyCODONE (OXY IR/ROXICODONE) 5 MG immediate release tablet, Take 1 tablet (5 mg total) by mouth every 6 (six) hours as needed for severe pain., Disp: 15 tablet, Rfl: 0   rosuvastatin (CRESTOR) 10 MG tablet, Take 10 mg by mouth daily., Disp: , Rfl:   Observations/Objective: Patient is well-developed, well-nourished in no acute distress.  Resting comfortably  at home.  Head is normocephalic, atraumatic.  No labored breathing.  Speech is clear and coherent with logical content.  Patient is alert at baseline.   Assessment and Plan: 1. Acute bacterial bronchitis (Primary) - doxycycline (VIBRA-TABS) 100 MG tablet; Take 1 tablet (100 mg total) by mouth 2 (two) times daily.  Dispense: 14 tablet; Refill: 0 - benzonatate (TESSALON) 100 MG capsule; Take 1 capsule (100 mg total) by mouth 3 (three) times daily as needed for cough.  Dispense: 30 capsule; Refill: 0  Rx Doxycycline.  Increase fluids.  Rest.  Saline nasal spray.  Probiotic.  Mucinex as directed.  Humidifier in bedroom. Tessalon per orders. Turn Q2H at home when able.  Strict ER precautions reviewed.  Call or return to clinic if symptoms  are not improving.   Follow Up Instructions: I discussed the assessment and treatment plan with the patient. The patient was provided an opportunity to ask questions and all were answered. The patient agreed with the plan and demonstrated an understanding of the instructions.  A copy of instructions were sent to the patient via MyChart unless otherwise noted below.    The patient was advised to call back or seek an in-person evaluation if the symptoms worsen or if the condition fails to improve as anticipated.    Piedad Climes, PA-C

## 2023-09-06 NOTE — Patient Instructions (Signed)
 Maria Bolton, thank you for joining Piedad Climes, PA-C for today's virtual visit.  While this provider is not your primary care provider (PCP), if your PCP is located in our provider database this encounter information will be shared with them immediately following your visit.   A [facility] MyChart account gives you access to today's visit and all your visits, tests, and labs performed at [facility] " click here if you don't have a [facility] MyChart account or go to [email or web address]  Consent: (Patient) Maria Bolton provided verbal consent for this virtual visit at the beginning of the encounter.  Current Medications:  Current Outpatient Medications:    ALPRAZolam (XANAX) 0.5 MG tablet, Take 1-2 tablets 30-45 minutes before MRI, may take an extra if needed, Disp: 3 tablet, Rfl: 0   cholecalciferol (VITAMIN D) 1000 units tablet, Take 1,000 Units by mouth daily., Disp: , Rfl:    donepezil (ARICEPT) 10 MG tablet, Take 1 tablet (10 mg total) by mouth at bedtime., Disp: 90 tablet, Rfl: 3   letrozole (FEMARA) 2.5 MG tablet, TAKE 1 TABLET(2.5 MG) BY MOUTH DAILY, Disp: 90 tablet, Rfl: 0   levothyroxine (SYNTHROID) 25 MCG tablet, Take 25 mcg by mouth every morning., Disp: , Rfl:    losartan (COZAAR) 100 MG tablet, Take 100 mg by mouth daily., Disp: , Rfl:    memantine (NAMENDA) 10 MG tablet, Take 1 tablet (10 mg total) by mouth 2 (two) times daily., Disp: 180 tablet, Rfl: 4   metoprolol succinate (TOPROL-XL) 25 MG 24 hr tablet, Take 1 tablet (25 mg total) by mouth daily. Take with or immediately following a meal., Disp: 90 tablet, Rfl: 3   mirabegron ER (MYRBETRIQ) 50 MG TB24 tablet, Take 50 mg by mouth daily., Disp: , Rfl:    oxyCODONE (OXY IR/ROXICODONE) 5 MG immediate release tablet, Take 1 tablet (5 mg total) by mouth every 6 (six) hours as needed for severe pain., Disp: 15 tablet, Rfl: 0   rosuvastatin (CRESTOR) 10 MG tablet, Take 10 mg by mouth daily., Disp: , Rfl:     Medications ordered in this encounter:  No orders of the defined types were placed in this encounter.    *If you need refills on other medications prior to your next appointment, please contact your pharmacy*  Follow-Up: Call back or seek an in-person evaluation if the symptoms worsen or if the condition fails to improve as anticipated.  [facility] [phone]  Other Instructions Take antibiotic (Doxycycline) as directed.  Increase fluids.  Get plenty of rest. Use Mucinex for congestion. Tessalon as directed for cough. Take a daily probiotic (I recommend Align or Culturelle, but even Activia Yogurt may be beneficial).  A humidifier placed in the bedroom may offer some relief for a dry, scratchy throat of nasal irritation.  Read information below on acute bronchitis. Please call or return to clinic if symptoms are not improving.  Acute Bronchitis Bronchitis is when the airways that extend from the windpipe into the lungs get red, puffy, and painful (inflamed). Bronchitis often causes thick spit (mucus) to develop. This leads to a cough. A cough is the most common symptom of bronchitis. In acute bronchitis, the condition usually begins suddenly and goes away over time (usually in 2 weeks). Smoking, allergies, and asthma can make bronchitis worse. Repeated episodes of bronchitis may cause more lung problems.  HOME CARE Rest. Drink enough fluids to keep your pee (urine) clear or pale yellow (unless you need to limit fluids  as told by your doctor). Only take over-the-counter or prescription medicines as told by your doctor. Avoid smoking and secondhand smoke. These can make bronchitis worse. If you are a smoker, think about using nicotine gum or skin patches. Quitting smoking will help your lungs heal faster. Reduce the chance of getting bronchitis again by: Washing your hands often. Avoiding people with cold symptoms. Trying not to touch your hands to your mouth, nose, or  eyes. Follow up with your doctor as told.  GET HELP IF: Your symptoms do not improve after 1 week of treatment. Symptoms include: Cough. Fever. Coughing up thick spit. Body aches. Chest congestion. Chills. Shortness of breath. Sore throat.  GET HELP RIGHT AWAY IF:  You have an increased fever. You have chills. You have severe shortness of breath. You have bloody thick spit (sputum). You throw up (vomit) often. You lose too much body fluid (dehydration). You have a severe headache. You faint.  MAKE SURE YOU:  Understand these instructions. Will watch your condition. Will get help right away if you are not doing well or get worse. Document Released: 11/09/2007 Document Revised: 01/23/2013 Document Reviewed: 11/13/2012 [facility] Patient Information 2015 [place], [place]. This information is not intended to replace advice given to you by your health care provider. Make sure you discuss any questions you have with your health care provider.    If you have been instructed to have an in-person evaluation today at a local Urgent Care facility, please use the link below. It will take you to a list of all of our available [facility], including address, phone number and hours of operation. Please do not delay care.  [facility]  If you or a family member do not have a primary care provider, use the link below to schedule a visit and establish care. When you choose a [facility] primary care physician or advanced practice provider, you gain a long-term partner in health. Find a Primary Care Provider  Learn more about [facility]'s in-office and virtual care options: [facility] - Get Care Now

## 2023-09-08 NOTE — Telephone Encounter (Signed)
  Wheel chair order sent by epic msg to debbie roach w/[employer]

## 2023-09-09 NOTE — Progress Notes (Signed)
 Chart reviewed, agree above plan ?

## 2023-09-12 ENCOUNTER — Ambulatory Visit: Payer: Medicare Other | Admitting: Neurology

## 2023-09-14 DIAGNOSIS — N3941 Urge incontinence: Secondary | ICD-10-CM | POA: Diagnosis not present

## 2023-09-14 DIAGNOSIS — R350 Frequency of micturition: Secondary | ICD-10-CM | POA: Diagnosis not present

## 2023-09-15 ENCOUNTER — Other Ambulatory Visit: Payer: Self-pay | Admitting: Hematology and Oncology

## 2023-09-19 DIAGNOSIS — F0390 Unspecified dementia without behavioral disturbance: Secondary | ICD-10-CM | POA: Diagnosis not present

## 2023-09-19 DIAGNOSIS — R531 Weakness: Secondary | ICD-10-CM | POA: Diagnosis not present

## 2023-09-19 DIAGNOSIS — I509 Heart failure, unspecified: Secondary | ICD-10-CM | POA: Diagnosis not present

## 2023-09-20 NOTE — Addendum Note (Signed)
 Addended by: Coryn Mosso on: 09/20/2023 02:59 PM   Modules accepted: Orders

## 2023-09-20 NOTE — Telephone Encounter (Signed)
 WHEELCHAIR ORDER SENT TO [employer]:

## 2023-09-20 NOTE — Telephone Encounter (Signed)
 Orders Placed This Encounter  Procedures   For home use only DME Wheelchair electric   Ambulatory Referral for DME

## 2023-09-20 NOTE — Addendum Note (Signed)
 Addended by: Randi Buster on: 09/20/2023 05:16 PM   Modules accepted: Orders

## 2023-09-21 DIAGNOSIS — N3941 Urge incontinence: Secondary | ICD-10-CM | POA: Diagnosis not present

## 2023-09-21 DIAGNOSIS — R350 Frequency of micturition: Secondary | ICD-10-CM | POA: Diagnosis not present

## 2023-09-26 ENCOUNTER — Telehealth: Payer: Self-pay | Admitting: Neurology

## 2023-09-26 DIAGNOSIS — F02C0 Dementia in other diseases classified elsewhere, severe, without behavioral disturbance, psychotic disturbance, mood disturbance, and anxiety: Secondary | ICD-10-CM

## 2023-09-26 DIAGNOSIS — R269 Unspecified abnormalities of gait and mobility: Secondary | ICD-10-CM

## 2023-09-26 DIAGNOSIS — R531 Weakness: Secondary | ICD-10-CM

## 2023-09-26 NOTE — Telephone Encounter (Signed)
 Pt's husband, Dacota Devall asking provider send a fax to [employer] for an addendum about the [employer]. Need to send the correct information to [employer].

## 2023-09-26 NOTE — Telephone Encounter (Signed)
 Hoyer lift request faxed to D[employer]  [phone]

## 2023-10-04 ENCOUNTER — Telehealth: Payer: Self-pay | Admitting: Neurology

## 2023-10-04 NOTE — Telephone Encounter (Signed)
 Maria Bolton I cannot get to print either:

## 2023-10-04 NOTE — Telephone Encounter (Signed)
 Letter faxed to [employer] [phone]

## 2023-10-04 NOTE — Telephone Encounter (Signed)
 I called the patient's husband.  Reviewed MRI of the brain with Dr. 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 Likely explains her right sided weakness. Chronic right maxillary sinusitis with increased opacification from prior MRI. If any sinus issues, will discuss with PCP.  Also received letter from [employer] asking for medical necessity of mechanical lift.  I will provide this letter.  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 No underlying brain edema or generalized hemorrhagic injury.   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   Chronic right maxillary sinusitis with increased opacification from 02/28/2023.

## 2023-10-17 NOTE — Addendum Note (Signed)
 Addended by: Kerri Kovacik on: 10/17/2023 05:32 PM   Modules accepted: Orders

## 2023-10-17 NOTE — Addendum Note (Signed)
 Addended by: Randi Buster on: 10/17/2023 04:48 PM   Modules accepted: Orders

## 2023-10-17 NOTE — Telephone Encounter (Signed)
 Called and spoke to Maria Bolton from [employer] and she stated that the providers note needs to include the following:  (Please copy and paste this into note)  Patient has mobility limitation that significantly impairs her ability to participate in 1 or more mobility related activities of daily living MRADL in the home & a mobility limitation that prevents the patient from accomplishing MRADL entirely. Patient has a caregiver to ensure safety while using wheelchair. It cannot be resolved with a walker or a cane.   Please addend note and sign off on the wheelchair   Routing to Dr Maria Bolton. Once completed will send to dme

## 2023-10-18 DIAGNOSIS — R29898 Other symptoms and signs involving the musculoskeletal system: Secondary | ICD-10-CM | POA: Diagnosis not present

## 2023-10-18 DIAGNOSIS — R296 Repeated falls: Secondary | ICD-10-CM | POA: Diagnosis not present

## 2023-10-18 NOTE — Telephone Encounter (Signed)
 Addendum was placed on the chart. Please let me know if anything else is needed. Also, I sent letter to insurance for hoyer lift 10/04/23.

## 2023-10-19 DIAGNOSIS — I509 Heart failure, unspecified: Secondary | ICD-10-CM | POA: Diagnosis not present

## 2023-10-19 DIAGNOSIS — F0390 Unspecified dementia without behavioral disturbance: Secondary | ICD-10-CM | POA: Diagnosis not present

## 2023-10-19 DIAGNOSIS — R531 Weakness: Secondary | ICD-10-CM | POA: Diagnosis not present

## 2023-10-19 NOTE — Telephone Encounter (Signed)
 Waiting on signature from dr. Gracie Lav for the wheelchair

## 2023-10-19 NOTE — Telephone Encounter (Signed)
 Faxed the order and notes to fax number [phone] (ATTENTION AMIE)

## 2023-10-24 DIAGNOSIS — R531 Weakness: Secondary | ICD-10-CM | POA: Diagnosis not present

## 2023-10-24 DIAGNOSIS — G301 Alzheimer's disease with late onset: Secondary | ICD-10-CM | POA: Diagnosis not present

## 2023-10-24 DIAGNOSIS — R269 Unspecified abnormalities of gait and mobility: Secondary | ICD-10-CM | POA: Diagnosis not present

## 2023-10-26 DIAGNOSIS — M5416 Radiculopathy, lumbar region: Secondary | ICD-10-CM | POA: Diagnosis not present

## 2023-10-26 DIAGNOSIS — R296 Repeated falls: Secondary | ICD-10-CM | POA: Diagnosis not present

## 2023-10-27 DIAGNOSIS — G301 Alzheimer's disease with late onset: Secondary | ICD-10-CM | POA: Diagnosis not present

## 2023-10-27 DIAGNOSIS — R531 Weakness: Secondary | ICD-10-CM | POA: Diagnosis not present

## 2023-10-27 DIAGNOSIS — R2689 Other abnormalities of gait and mobility: Secondary | ICD-10-CM | POA: Diagnosis not present

## 2023-11-01 ENCOUNTER — Ambulatory Visit: Payer: Self-pay

## 2023-11-01 ENCOUNTER — Telehealth: Admitting: Nurse Practitioner

## 2023-11-01 DIAGNOSIS — R82998 Other abnormal findings in urine: Secondary | ICD-10-CM | POA: Diagnosis not present

## 2023-11-01 DIAGNOSIS — R8290 Unspecified abnormal findings in urine: Secondary | ICD-10-CM | POA: Diagnosis not present

## 2023-11-01 NOTE — Telephone Encounter (Signed)
 Copied from CRM [id number]. Topic: Clinical - Red Word Triage >> Nov 01, 2023  2:39 PM Kita Perish H wrote: [facility] that prompted transfer to Nurse Triage: UTI, really dark and odor to urine, patient is non verbal was scrunching up face like she's having pain. Patient has purwick in home.   Chief Complaint: UTI symptoms Symptoms: dark urine and foul odor Frequency: constant Pertinent Negatives: Patient denies fever Disposition: [] ED /[] Urgent Care (no appt availability in office) / [] Appointment(In office/virtual)/ []  [facility]/ [] Home Care/ [] Refused Recommended Disposition /[] [facility]/ []  Follow-up with PCP Additional Notes: spoke  caregiver Peterson Brandt who reports dark colored urine since last night. Peterson Brandt sts that she has been making sure patient is drinking more and since then her urine is getting lighter.  Peterson Brandt also reports that pts urine has a foul odor and patient winces whenever she urinated. Pt is non-verbal, so Peterson Brandt presumes that patient may be in some pain with urination.  Pt is currently immobile, unable to arrange transportation within next 24 hours as recommended. Virtual UC appt schedued for today at 6:45p Reason for Disposition  Bad or foul-smelling urine  Answer Assessment - Initial Assessment Questions 1. SYMPTOM: "What's the main symptom you're concerned about?" (e.g., frequency, incontinence)     Dark urine  2. ONSET: "When did the  symptoms  start?"     2 days ago  3. PAIN: "Is there any pain?" If Yes, ask: "How bad is it?" (Scale: 1-10; mild, moderate, severe)     *No Answer* 4. CAUSE: "What do you think is causing the symptoms?"     *No Answer* 5. OTHER SYMPTOMS: "Do you have any other symptoms?" (e.g., blood in urine, fever, flank pain, pain with urination)     Not drinking a lot, odor, pain with urination  6. PREGNANCY: "Is there any chance you are pregnant?" "When was your last menstrual period?"     *No Answer*  Protocols used: Urinary  Symptoms-A-AH

## 2023-11-01 NOTE — Progress Notes (Signed)
 Virtual Visit Consent   Maria Bolton, you are scheduled for a virtual visit with a [facility] provider today. Just as with appointments in the office, your consent must be obtained to participate. Your consent will be active for this visit and any virtual visit you may have with one of our providers in the next 365 days. If you have a MyChart account, a copy of this consent can be sent to you electronically.  As this is a virtual visit, video technology does not allow for your provider to perform a traditional examination. This may limit your provider's ability to fully assess your condition. If your provider identifies any concerns that need to be evaluated in person or the need to arrange testing (such as labs, EKG, etc.), we will make arrangements to do so. Although advances in technology are sophisticated, we cannot ensure that it will always work on either your end or our end. If the connection with a video visit is poor, the visit may have to be switched to a telephone visit. With either a video or telephone visit, we are not always able to ensure that we have a secure connection.  By engaging in this virtual visit, you consent to the provision of healthcare and authorize for your insurance to be billed (if applicable) for the services provided during this visit. Depending on your insurance coverage, you may receive a charge related to this service.  I need to obtain your verbal consent now. Are you willing to proceed with your visit today? Maria Bolton has provided verbal consent on 11/01/2023 for a virtual visit (video or telephone). Maria Shake, FNP  Date: 11/01/2023 6:38 PM   Virtual Visit via Video Note   I, Maria Bolton, connected with  Maria Bolton  ([id number], 1945-06-21) on 11/01/23 at  6:45 PM EDT by a video-enabled telemedicine application and verified that I am speaking with the correct person using two identifiers.  Location: Patient: Virtual Visit Location Patient: Home Provider:  Virtual Visit Location Provider: Home Office Nurse is present Maria Bolton)    I discussed the limitations of evaluation and management by telemedicine and the availability of in person appointments. The patient expressed understanding and agreed to proceed.    History of Present Illness: Maria Bolton is a 78 y.o. who identifies as a female who was assigned female at birth, and is being seen today for concerns over changes in urine color and odor today. She was triaged by a nurse that set them up for a virtual appointment tonight   When the nurse increased her urine intake she felt that the color started to lighten but that the odor remained. She also is concerned because she feels the patient is wincing when she does urinate No fever or other symptoms   She currently uses a purewick because she is incontinent   Patient has a significant medical history as noted below and is a patient of Dr. Clarke Bolton in Urology    Problems:  Patient Active Problem List   Diagnosis Date Noted   Morbid obesity (HCC) 11/01/2023   Edema of right upper arm 08/10/2023   Lumbar radiculopathy 06/26/2023   Muscle weakness (generalized) 06/20/2023   Stiffness of right wrist joint 06/20/2023   Right wrist pain 06/12/2023   Arthralgia of left knee 06/11/2023   Chronic pain of both knees 03/29/2023   Primary osteoarthritis of both knees 03/29/2023   Falls 03/21/2023   Recurrent falls 03/21/2023   Right sided weakness 02/20/2023   Gait abnormality  02/20/2023   Osteoarthritis of right knee 12/20/2021   Lymphedema of breast 12/13/2021   Genetic testing 12/03/2021   Family history of breast cancer 11/18/2021   Malignant neoplasm of lower-inner quadrant of left breast in female, estrogen receptor positive (HCC) 10/18/2021   History of left breast cancer 10/14/2021   Severe late onset Alzheimer's dementia (HCC) 02/02/2021   Osteoarthritis of left knee 08/27/2020   Abnormal thyroid  blood test 12/27/2016   Abnormal  glucose 09/06/2016   Mild cognitive impairment 09/06/2016   Fever 09/19/2015   Acute bronchitis 09/19/2015   Anxiety about health 09/19/2015   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   Hypertension 05/11/2011    Allergies: No Known Allergies Medications:  Current Outpatient Medications:    ALPRAZolam  (XANAX ) 0.5 MG tablet, Take 1-2 tablets 30-45 minutes before MRI, may take an extra if needed, Disp: 3 tablet, Rfl: 0   benzonatate  (TESSALON ) 100 MG capsule, Take 1 capsule (100 mg total) by mouth 3 (three) times daily as needed for cough., Disp: 30 capsule, Rfl: 0   cholecalciferol  (VITAMIN D ) 1000 units tablet, Take 1,000 Units by mouth daily., Disp: , Rfl:    donepezil  (ARICEPT ) 10 MG tablet, Take 1 tablet (10 mg total) by mouth at bedtime., Disp: 90 tablet, Rfl: 3   doxycycline  (VIBRA -TABS) 100 MG tablet, Take 1 tablet (100 mg total) by mouth 2 (two) times daily., Disp: 14 tablet, Rfl: 0   letrozole  (FEMARA ) 2.5 MG tablet, TAKE 1 TABLET(2.5 MG) BY MOUTH DAILY, Disp: 90 tablet, Rfl: 2   levothyroxine  (SYNTHROID) 25 MCG tablet, Take 25 mcg by mouth every morning., Disp: , Rfl:    losartan  (COZAAR ) 100 MG tablet, Take 100 mg by mouth daily., Disp: , Rfl:    memantine  (NAMENDA ) 10 MG tablet, Take 1 tablet (10 mg total) by mouth 2 (two) times daily., Disp: 180 tablet, Rfl: 4   metoprolol  succinate (TOPROL -XL) 25 MG 24 hr tablet, Take 1 tablet (25 mg total) by mouth daily. Take with or immediately following a meal., Disp: 90 tablet, Rfl: 3   mirabegron ER (MYRBETRIQ) 50 MG TB24 tablet, Take 50 mg by mouth daily., Disp: , Rfl:    oxyCODONE  (OXY IR/ROXICODONE ) 5 MG immediate release tablet, Take 1 tablet (5 mg total) by mouth every 6 (six) hours as needed for severe pain., Disp: 15 tablet, Rfl: 0   rosuvastatin  (CRESTOR ) 10 MG tablet, Take 10 mg by mouth daily., Disp: , Rfl:   Observations/Objective: Patient is well-developed, well-nourished in no acute distress.  Resting  comfortably  at home.  Head is normocephalic, atraumatic.  No labored breathing.  Patient is at baseline.    Assessment  1. Bad odor of urine  2. Dark urine  Discussed with nurse on visit that patient will need urine culture. Advised reaching out to Urology and then PCP if needed for assistance with labs/workup    Caretaker is agreeable to plan   Follow Up Instructions: I discussed the assessment and treatment plan with the patient. The patient was provided an opportunity to ask questions and all were answered. The patient agreed with the plan and demonstrated an understanding of the instructions.  A copy of instructions were sent to the patient via MyChart unless otherwise noted below.    The patient was advised to call back or seek an in-person evaluation if the symptoms worsen or if the condition fails to improve as anticipated.    Maria Shake, FNP

## 2023-11-02 DIAGNOSIS — R350 Frequency of micturition: Secondary | ICD-10-CM | POA: Diagnosis not present

## 2023-11-19 DIAGNOSIS — I509 Heart failure, unspecified: Secondary | ICD-10-CM | POA: Diagnosis not present

## 2023-11-19 DIAGNOSIS — F0390 Unspecified dementia without behavioral disturbance: Secondary | ICD-10-CM | POA: Diagnosis not present

## 2023-11-19 DIAGNOSIS — R531 Weakness: Secondary | ICD-10-CM | POA: Diagnosis not present

## 2023-11-24 DIAGNOSIS — R531 Weakness: Secondary | ICD-10-CM | POA: Diagnosis not present

## 2023-11-24 DIAGNOSIS — R269 Unspecified abnormalities of gait and mobility: Secondary | ICD-10-CM | POA: Diagnosis not present

## 2023-11-24 DIAGNOSIS — G301 Alzheimer's disease with late onset: Secondary | ICD-10-CM | POA: Diagnosis not present

## 2023-11-27 DIAGNOSIS — R2689 Other abnormalities of gait and mobility: Secondary | ICD-10-CM | POA: Diagnosis not present

## 2023-11-27 DIAGNOSIS — R531 Weakness: Secondary | ICD-10-CM | POA: Diagnosis not present

## 2023-11-27 DIAGNOSIS — G301 Alzheimer's disease with late onset: Secondary | ICD-10-CM | POA: Diagnosis not present

## 2023-12-18 ENCOUNTER — Ambulatory Visit: Admitting: Neurology

## 2023-12-19 DIAGNOSIS — F0390 Unspecified dementia without behavioral disturbance: Secondary | ICD-10-CM | POA: Diagnosis not present

## 2023-12-19 DIAGNOSIS — R531 Weakness: Secondary | ICD-10-CM | POA: Diagnosis not present

## 2023-12-19 DIAGNOSIS — I509 Heart failure, unspecified: Secondary | ICD-10-CM | POA: Diagnosis not present

## 2023-12-20 NOTE — Progress Notes (Signed)
 This encounter was created in error - please disregard.

## 2023-12-24 DIAGNOSIS — G301 Alzheimer's disease with late onset: Secondary | ICD-10-CM | POA: Diagnosis not present

## 2023-12-24 DIAGNOSIS — R269 Unspecified abnormalities of gait and mobility: Secondary | ICD-10-CM | POA: Diagnosis not present

## 2023-12-24 DIAGNOSIS — R531 Weakness: Secondary | ICD-10-CM | POA: Diagnosis not present

## 2023-12-27 DIAGNOSIS — R531 Weakness: Secondary | ICD-10-CM | POA: Diagnosis not present

## 2023-12-27 DIAGNOSIS — R2689 Other abnormalities of gait and mobility: Secondary | ICD-10-CM | POA: Diagnosis not present

## 2023-12-27 DIAGNOSIS — G301 Alzheimer's disease with late onset: Secondary | ICD-10-CM | POA: Diagnosis not present

## 2024-01-09 ENCOUNTER — Ambulatory Visit: Admitting: Neurology

## 2024-01-09 ENCOUNTER — Encounter: Payer: Self-pay | Admitting: Neurology

## 2024-01-19 DIAGNOSIS — F0390 Unspecified dementia without behavioral disturbance: Secondary | ICD-10-CM | POA: Diagnosis not present

## 2024-01-19 DIAGNOSIS — R531 Weakness: Secondary | ICD-10-CM | POA: Diagnosis not present

## 2024-01-19 DIAGNOSIS — I509 Heart failure, unspecified: Secondary | ICD-10-CM | POA: Diagnosis not present

## 2024-01-24 DIAGNOSIS — R269 Unspecified abnormalities of gait and mobility: Secondary | ICD-10-CM | POA: Diagnosis not present

## 2024-01-24 DIAGNOSIS — G301 Alzheimer's disease with late onset: Secondary | ICD-10-CM | POA: Diagnosis not present

## 2024-01-24 DIAGNOSIS — R531 Weakness: Secondary | ICD-10-CM | POA: Diagnosis not present

## 2024-01-27 DIAGNOSIS — R531 Weakness: Secondary | ICD-10-CM | POA: Diagnosis not present

## 2024-01-27 DIAGNOSIS — R2689 Other abnormalities of gait and mobility: Secondary | ICD-10-CM | POA: Diagnosis not present

## 2024-01-27 DIAGNOSIS — G301 Alzheimer's disease with late onset: Secondary | ICD-10-CM | POA: Diagnosis not present

## 2024-02-01 DIAGNOSIS — M5416 Radiculopathy, lumbar region: Secondary | ICD-10-CM | POA: Diagnosis not present

## 2024-02-01 DIAGNOSIS — M1711 Unilateral primary osteoarthritis, right knee: Secondary | ICD-10-CM | POA: Diagnosis not present

## 2024-02-06 ENCOUNTER — Ambulatory Visit: Admitting: Family Medicine

## 2024-02-15 ENCOUNTER — Inpatient Hospital Stay: Payer: Medicare Other | Attending: Hematology and Oncology | Admitting: Hematology and Oncology

## 2024-02-19 DIAGNOSIS — F0390 Unspecified dementia without behavioral disturbance: Secondary | ICD-10-CM | POA: Diagnosis not present

## 2024-02-19 DIAGNOSIS — R531 Weakness: Secondary | ICD-10-CM | POA: Diagnosis not present

## 2024-02-19 DIAGNOSIS — I509 Heart failure, unspecified: Secondary | ICD-10-CM | POA: Diagnosis not present

## 2024-02-24 DIAGNOSIS — G301 Alzheimer's disease with late onset: Secondary | ICD-10-CM | POA: Diagnosis not present

## 2024-02-24 DIAGNOSIS — R269 Unspecified abnormalities of gait and mobility: Secondary | ICD-10-CM | POA: Diagnosis not present

## 2024-02-24 DIAGNOSIS — R531 Weakness: Secondary | ICD-10-CM | POA: Diagnosis not present

## 2024-02-27 DIAGNOSIS — R2689 Other abnormalities of gait and mobility: Secondary | ICD-10-CM | POA: Diagnosis not present

## 2024-02-27 DIAGNOSIS — G301 Alzheimer's disease with late onset: Secondary | ICD-10-CM | POA: Diagnosis not present

## 2024-02-27 DIAGNOSIS — R531 Weakness: Secondary | ICD-10-CM | POA: Diagnosis not present

## 2024-03-01 DIAGNOSIS — J449 Chronic obstructive pulmonary disease, unspecified: Secondary | ICD-10-CM | POA: Diagnosis not present

## 2024-03-01 DIAGNOSIS — J4489 Other specified chronic obstructive pulmonary disease: Secondary | ICD-10-CM | POA: Diagnosis not present

## 2024-03-01 DIAGNOSIS — J9611 Chronic respiratory failure with hypoxia: Secondary | ICD-10-CM | POA: Diagnosis not present

## 2024-03-01 DIAGNOSIS — R918 Other nonspecific abnormal finding of lung field: Secondary | ICD-10-CM | POA: Diagnosis not present

## 2024-03-01 DIAGNOSIS — J984 Other disorders of lung: Secondary | ICD-10-CM | POA: Diagnosis not present

## 2024-03-10 ENCOUNTER — Other Ambulatory Visit: Payer: Self-pay | Admitting: Neurology

## 2024-03-20 DIAGNOSIS — F0390 Unspecified dementia without behavioral disturbance: Secondary | ICD-10-CM | POA: Diagnosis not present

## 2024-03-20 DIAGNOSIS — R531 Weakness: Secondary | ICD-10-CM | POA: Diagnosis not present

## 2024-03-20 DIAGNOSIS — I509 Heart failure, unspecified: Secondary | ICD-10-CM | POA: Diagnosis not present

## 2024-03-25 DIAGNOSIS — M5416 Radiculopathy, lumbar region: Secondary | ICD-10-CM | POA: Diagnosis not present

## 2024-03-25 DIAGNOSIS — L89159 Pressure ulcer of sacral region, unspecified stage: Secondary | ICD-10-CM | POA: Diagnosis not present

## 2024-03-25 DIAGNOSIS — Z853 Personal history of malignant neoplasm of breast: Secondary | ICD-10-CM | POA: Diagnosis not present

## 2024-03-25 DIAGNOSIS — B372 Candidiasis of skin and nail: Secondary | ICD-10-CM | POA: Diagnosis not present

## 2024-03-25 DIAGNOSIS — I10 Essential (primary) hypertension: Secondary | ICD-10-CM | POA: Diagnosis not present

## 2024-03-25 DIAGNOSIS — R531 Weakness: Secondary | ICD-10-CM | POA: Diagnosis not present

## 2024-03-25 DIAGNOSIS — G301 Alzheimer's disease with late onset: Secondary | ICD-10-CM | POA: Diagnosis not present

## 2024-03-25 DIAGNOSIS — R269 Unspecified abnormalities of gait and mobility: Secondary | ICD-10-CM | POA: Diagnosis not present

## 2024-03-25 DIAGNOSIS — M1712 Unilateral primary osteoarthritis, left knee: Secondary | ICD-10-CM | POA: Diagnosis not present

## 2024-03-25 DIAGNOSIS — Z23 Encounter for immunization: Secondary | ICD-10-CM | POA: Diagnosis not present

## 2024-03-28 DIAGNOSIS — R531 Weakness: Secondary | ICD-10-CM | POA: Diagnosis not present

## 2024-03-28 DIAGNOSIS — G301 Alzheimer's disease with late onset: Secondary | ICD-10-CM | POA: Diagnosis not present

## 2024-03-28 DIAGNOSIS — R2689 Other abnormalities of gait and mobility: Secondary | ICD-10-CM | POA: Diagnosis not present

## 2024-04-01 DIAGNOSIS — M5416 Radiculopathy, lumbar region: Secondary | ICD-10-CM | POA: Diagnosis not present

## 2024-04-01 DIAGNOSIS — M170 Bilateral primary osteoarthritis of knee: Secondary | ICD-10-CM | POA: Diagnosis not present

## 2024-04-01 DIAGNOSIS — G8929 Other chronic pain: Secondary | ICD-10-CM | POA: Diagnosis not present

## 2024-04-01 DIAGNOSIS — Z556 Problems related to health literacy: Secondary | ICD-10-CM | POA: Diagnosis not present

## 2024-04-01 DIAGNOSIS — Z79890 Hormone replacement therapy (postmenopausal): Secondary | ICD-10-CM | POA: Diagnosis not present

## 2024-04-01 DIAGNOSIS — B372 Candidiasis of skin and nail: Secondary | ICD-10-CM | POA: Diagnosis not present

## 2024-04-01 DIAGNOSIS — L89312 Pressure ulcer of right buttock, stage 2: Secondary | ICD-10-CM | POA: Diagnosis not present

## 2024-04-01 DIAGNOSIS — Z87440 Personal history of urinary (tract) infections: Secondary | ICD-10-CM | POA: Diagnosis not present

## 2024-04-01 DIAGNOSIS — Z79899 Other long term (current) drug therapy: Secondary | ICD-10-CM | POA: Diagnosis not present

## 2024-04-01 DIAGNOSIS — G301 Alzheimer's disease with late onset: Secondary | ICD-10-CM | POA: Diagnosis not present

## 2024-04-01 DIAGNOSIS — Z5982 Transportation insecurity: Secondary | ICD-10-CM | POA: Diagnosis not present

## 2024-04-01 DIAGNOSIS — Z853 Personal history of malignant neoplasm of breast: Secondary | ICD-10-CM | POA: Diagnosis not present

## 2024-04-01 DIAGNOSIS — F0280 Dementia in other diseases classified elsewhere without behavioral disturbance: Secondary | ICD-10-CM | POA: Diagnosis not present

## 2024-04-01 DIAGNOSIS — Z7401 Bed confinement status: Secondary | ICD-10-CM | POA: Diagnosis not present

## 2024-04-01 DIAGNOSIS — Z9181 History of falling: Secondary | ICD-10-CM | POA: Diagnosis not present

## 2024-04-01 DIAGNOSIS — I10 Essential (primary) hypertension: Secondary | ICD-10-CM | POA: Diagnosis not present

## 2024-04-01 DIAGNOSIS — Z6832 Body mass index (BMI) 32.0-32.9, adult: Secondary | ICD-10-CM | POA: Diagnosis not present

## 2024-04-01 DIAGNOSIS — Z87891 Personal history of nicotine dependence: Secondary | ICD-10-CM | POA: Diagnosis not present

## 2024-04-02 DIAGNOSIS — M1711 Unilateral primary osteoarthritis, right knee: Secondary | ICD-10-CM | POA: Diagnosis not present

## 2024-04-02 DIAGNOSIS — I10 Essential (primary) hypertension: Secondary | ICD-10-CM | POA: Diagnosis not present

## 2024-04-02 DIAGNOSIS — G301 Alzheimer's disease with late onset: Secondary | ICD-10-CM | POA: Diagnosis not present

## 2024-04-02 DIAGNOSIS — M1712 Unilateral primary osteoarthritis, left knee: Secondary | ICD-10-CM | POA: Diagnosis not present

## 2024-04-02 DIAGNOSIS — M5416 Radiculopathy, lumbar region: Secondary | ICD-10-CM | POA: Diagnosis not present

## 2024-04-03 DIAGNOSIS — R051 Acute cough: Secondary | ICD-10-CM | POA: Diagnosis not present

## 2024-04-03 DIAGNOSIS — N3000 Acute cystitis without hematuria: Secondary | ICD-10-CM | POA: Diagnosis not present

## 2024-04-03 DIAGNOSIS — M47896 Other spondylosis, lumbar region: Secondary | ICD-10-CM | POA: Diagnosis not present

## 2024-04-03 DIAGNOSIS — B372 Candidiasis of skin and nail: Secondary | ICD-10-CM | POA: Diagnosis not present

## 2024-04-04 DIAGNOSIS — J90 Pleural effusion, not elsewhere classified: Secondary | ICD-10-CM | POA: Diagnosis not present

## 2024-04-04 DIAGNOSIS — J986 Disorders of diaphragm: Secondary | ICD-10-CM | POA: Diagnosis not present

## 2024-04-04 DIAGNOSIS — J9809 Other diseases of bronchus, not elsewhere classified: Secondary | ICD-10-CM | POA: Diagnosis not present

## 2024-04-20 DIAGNOSIS — R531 Weakness: Secondary | ICD-10-CM | POA: Diagnosis not present

## 2024-04-20 DIAGNOSIS — F0390 Unspecified dementia without behavioral disturbance: Secondary | ICD-10-CM | POA: Diagnosis not present

## 2024-04-24 DIAGNOSIS — I10 Essential (primary) hypertension: Secondary | ICD-10-CM | POA: Diagnosis not present

## 2024-04-24 DIAGNOSIS — M5416 Radiculopathy, lumbar region: Secondary | ICD-10-CM | POA: Diagnosis not present

## 2024-04-24 DIAGNOSIS — M1711 Unilateral primary osteoarthritis, right knee: Secondary | ICD-10-CM | POA: Diagnosis not present

## 2024-04-24 DIAGNOSIS — C50312 Malignant neoplasm of lower-inner quadrant of left female breast: Secondary | ICD-10-CM | POA: Diagnosis not present

## 2024-04-24 DIAGNOSIS — M1712 Unilateral primary osteoarthritis, left knee: Secondary | ICD-10-CM | POA: Diagnosis not present

## 2024-04-24 DIAGNOSIS — G301 Alzheimer's disease with late onset: Secondary | ICD-10-CM | POA: Diagnosis not present

## 2024-04-25 DIAGNOSIS — R531 Weakness: Secondary | ICD-10-CM | POA: Diagnosis not present

## 2024-04-25 DIAGNOSIS — R269 Unspecified abnormalities of gait and mobility: Secondary | ICD-10-CM | POA: Diagnosis not present

## 2024-04-25 DIAGNOSIS — L89312 Pressure ulcer of right buttock, stage 2: Secondary | ICD-10-CM | POA: Diagnosis not present

## 2024-04-25 DIAGNOSIS — G301 Alzheimer's disease with late onset: Secondary | ICD-10-CM | POA: Diagnosis not present

## 2024-04-28 DIAGNOSIS — R2689 Other abnormalities of gait and mobility: Secondary | ICD-10-CM | POA: Diagnosis not present

## 2024-04-28 DIAGNOSIS — R531 Weakness: Secondary | ICD-10-CM | POA: Diagnosis not present

## 2024-04-29 DIAGNOSIS — J189 Pneumonia, unspecified organism: Secondary | ICD-10-CM | POA: Diagnosis not present

## 2024-04-29 DIAGNOSIS — N3000 Acute cystitis without hematuria: Secondary | ICD-10-CM | POA: Diagnosis not present

## 2024-05-09 ENCOUNTER — Ambulatory Visit: Admitting: Family Medicine

## 2024-05-09 DIAGNOSIS — E039 Hypothyroidism, unspecified: Secondary | ICD-10-CM | POA: Diagnosis not present

## 2024-05-09 DIAGNOSIS — I10 Essential (primary) hypertension: Secondary | ICD-10-CM | POA: Diagnosis not present

## 2024-05-09 DIAGNOSIS — M1711 Unilateral primary osteoarthritis, right knee: Secondary | ICD-10-CM | POA: Diagnosis not present

## 2024-05-09 DIAGNOSIS — E782 Mixed hyperlipidemia: Secondary | ICD-10-CM | POA: Diagnosis not present

## 2024-05-09 DIAGNOSIS — G301 Alzheimer's disease with late onset: Secondary | ICD-10-CM | POA: Diagnosis not present

## 2024-05-09 DIAGNOSIS — R54 Age-related physical debility: Secondary | ICD-10-CM | POA: Diagnosis not present

## 2024-05-09 DIAGNOSIS — L89159 Pressure ulcer of sacral region, unspecified stage: Secondary | ICD-10-CM | POA: Diagnosis not present

## 2024-05-14 DIAGNOSIS — Z7689 Persons encountering health services in other specified circumstances: Secondary | ICD-10-CM | POA: Diagnosis not present

## 2024-05-20 DIAGNOSIS — R531 Weakness: Secondary | ICD-10-CM | POA: Diagnosis not present

## 2024-05-20 DIAGNOSIS — F0390 Unspecified dementia without behavioral disturbance: Secondary | ICD-10-CM | POA: Diagnosis not present
